# Patient Record
Sex: Female | Born: 1980 | Race: Black or African American | Hispanic: No | Marital: Single | Smoking: Former smoker
Health system: Southern US, Community
[De-identification: ages and names within clinical notes are randomized; demographics above are authoritative.]

## PROBLEM LIST (undated history)

## (undated) DIAGNOSIS — O99345 Other mental disorders complicating the puerperium: Secondary | ICD-10-CM

## (undated) DIAGNOSIS — B379 Candidiasis, unspecified: Secondary | ICD-10-CM

## (undated) DIAGNOSIS — R21 Rash and other nonspecific skin eruption: Secondary | ICD-10-CM

## (undated) DIAGNOSIS — Z30011 Encounter for initial prescription of contraceptive pills: Secondary | ICD-10-CM

## (undated) DIAGNOSIS — F331 Major depressive disorder, recurrent, moderate: Secondary | ICD-10-CM

## (undated) DIAGNOSIS — N760 Acute vaginitis: Secondary | ICD-10-CM

## (undated) DIAGNOSIS — R590 Localized enlarged lymph nodes: Secondary | ICD-10-CM

## (undated) DIAGNOSIS — Z720 Tobacco use: Secondary | ICD-10-CM

## (undated) DIAGNOSIS — Z332 Encounter for elective termination of pregnancy: Secondary | ICD-10-CM

## (undated) DIAGNOSIS — F419 Anxiety disorder, unspecified: Secondary | ICD-10-CM

## (undated) DIAGNOSIS — R002 Palpitations: Secondary | ICD-10-CM

## (undated) DIAGNOSIS — Z9289 Personal history of other medical treatment: Secondary | ICD-10-CM

## (undated) DIAGNOSIS — K649 Unspecified hemorrhoids: Secondary | ICD-10-CM

## (undated) DIAGNOSIS — N898 Other specified noninflammatory disorders of vagina: Secondary | ICD-10-CM

## (undated) DIAGNOSIS — R300 Dysuria: Secondary | ICD-10-CM

## (undated) DIAGNOSIS — A749 Chlamydial infection, unspecified: Secondary | ICD-10-CM

## (undated) DIAGNOSIS — N63 Unspecified lump in unspecified breast: Secondary | ICD-10-CM

## (undated) DIAGNOSIS — F909 Attention-deficit hyperactivity disorder, unspecified type: Secondary | ICD-10-CM

## (undated) DIAGNOSIS — J029 Acute pharyngitis, unspecified: Secondary | ICD-10-CM

## (undated) DIAGNOSIS — G4700 Insomnia, unspecified: Secondary | ICD-10-CM

## (undated) DIAGNOSIS — F53 Postpartum depression: Secondary | ICD-10-CM

## (undated) DIAGNOSIS — O009 Unspecified ectopic pregnancy without intrauterine pregnancy: Secondary | ICD-10-CM

## (undated) HISTORY — DX: Chlamydial infection, unspecified: A74.9

## (undated) HISTORY — DX: Rash and other nonspecific skin eruption: R21

## (undated) HISTORY — DX: Postpartum depression: F53.0

## (undated) HISTORY — DX: Major depressive disorder, recurrent, moderate: F33.1

## (undated) HISTORY — DX: Candidiasis, unspecified: B37.9

## (undated) HISTORY — DX: Palpitations: R00.2

## (undated) HISTORY — DX: Other specified noninflammatory disorders of vagina: N89.8

## (undated) HISTORY — DX: Encounter for elective termination of pregnancy: Z33.2

## (undated) HISTORY — DX: Encounter for initial prescription of contraceptive pills: Z30.011

## (undated) HISTORY — DX: Acute pharyngitis, unspecified: J02.9

## (undated) HISTORY — DX: Acute vaginitis: N76.0

## (undated) HISTORY — DX: Unspecified ectopic pregnancy without intrauterine pregnancy: O00.90

## (undated) HISTORY — DX: Unspecified hemorrhoids: K64.9

## (undated) HISTORY — DX: Insomnia, unspecified: G47.00

## (undated) HISTORY — DX: Localized enlarged lymph nodes: R59.0

## (undated) HISTORY — DX: Unspecified lump in unspecified breast: N63.0

## (undated) HISTORY — DX: Other mental disorders complicating the puerperium: O99.345

## (undated) HISTORY — DX: Dysuria: R30.0

## (undated) HISTORY — DX: Personal history of other medical treatment: Z92.89

## (undated) HISTORY — DX: Tobacco use: Z72.0

## (undated) HISTORY — PX: COLPOSCOPY: SHX161

## (undated) HISTORY — DX: Attention-deficit hyperactivity disorder, unspecified type: F90.9

---
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
## 2001-01-18 ENCOUNTER — Encounter: Admission: RE | Admit: 2001-01-18 | Discharge: 2001-01-18 | Payer: Self-pay | Admitting: Family Medicine

## 2001-03-15 ENCOUNTER — Encounter: Admission: RE | Admit: 2001-03-15 | Discharge: 2001-03-15 | Payer: Self-pay | Admitting: Family Medicine

## 2001-06-24 ENCOUNTER — Encounter: Admission: RE | Admit: 2001-06-24 | Discharge: 2001-06-24 | Payer: Self-pay | Admitting: Family Medicine

## 2001-06-27 ENCOUNTER — Encounter: Admission: RE | Admit: 2001-06-27 | Discharge: 2001-06-27 | Payer: Self-pay | Admitting: Family Medicine

## 2001-07-12 ENCOUNTER — Encounter: Admission: RE | Admit: 2001-07-12 | Discharge: 2001-07-12 | Payer: Self-pay | Admitting: Family Medicine

## 2001-07-12 ENCOUNTER — Other Ambulatory Visit: Admission: RE | Admit: 2001-07-12 | Discharge: 2001-07-12 | Payer: Self-pay | Admitting: Family Medicine

## 2001-08-02 ENCOUNTER — Encounter: Admission: RE | Admit: 2001-08-02 | Discharge: 2001-08-02 | Payer: Self-pay | Admitting: Family Medicine

## 2001-08-19 ENCOUNTER — Encounter: Admission: RE | Admit: 2001-08-19 | Discharge: 2001-08-19 | Payer: Self-pay | Admitting: Family Medicine

## 2001-08-26 ENCOUNTER — Encounter: Admission: RE | Admit: 2001-08-26 | Discharge: 2001-08-26 | Payer: Self-pay | Admitting: Family Medicine

## 2001-10-07 ENCOUNTER — Encounter: Admission: RE | Admit: 2001-10-07 | Discharge: 2001-10-07 | Payer: Self-pay | Admitting: Family Medicine

## 2001-10-25 ENCOUNTER — Encounter: Admission: RE | Admit: 2001-10-25 | Discharge: 2001-10-25 | Payer: Self-pay | Admitting: Family Medicine

## 2001-11-28 ENCOUNTER — Encounter: Admission: RE | Admit: 2001-11-28 | Discharge: 2001-11-28 | Payer: Self-pay | Admitting: Family Medicine

## 2002-01-04 ENCOUNTER — Encounter: Admission: RE | Admit: 2002-01-04 | Discharge: 2002-01-04 | Payer: Self-pay | Admitting: Family Medicine

## 2002-01-11 ENCOUNTER — Encounter: Admission: RE | Admit: 2002-01-11 | Discharge: 2002-01-11 | Payer: Self-pay | Admitting: Sports Medicine

## 2002-01-17 ENCOUNTER — Encounter: Admission: RE | Admit: 2002-01-17 | Discharge: 2002-01-17 | Payer: Self-pay | Admitting: Family Medicine

## 2002-01-25 ENCOUNTER — Encounter: Admission: RE | Admit: 2002-01-25 | Discharge: 2002-01-25 | Payer: Self-pay | Admitting: Family Medicine

## 2002-02-17 ENCOUNTER — Encounter: Admission: RE | Admit: 2002-02-17 | Discharge: 2002-02-17 | Payer: Self-pay | Admitting: Family Medicine

## 2002-03-06 ENCOUNTER — Encounter: Admission: RE | Admit: 2002-03-06 | Discharge: 2002-03-06 | Payer: Self-pay | Admitting: Family Medicine

## 2002-04-12 ENCOUNTER — Encounter: Admission: RE | Admit: 2002-04-12 | Discharge: 2002-04-12 | Payer: Self-pay | Admitting: Family Medicine

## 2002-07-06 ENCOUNTER — Encounter: Admission: RE | Admit: 2002-07-06 | Discharge: 2002-07-06 | Payer: Self-pay | Admitting: Family Medicine

## 2002-07-13 ENCOUNTER — Encounter: Admission: RE | Admit: 2002-07-13 | Discharge: 2002-07-13 | Payer: Self-pay | Admitting: Family Medicine

## 2002-08-11 ENCOUNTER — Encounter: Admission: RE | Admit: 2002-08-11 | Discharge: 2002-08-11 | Payer: Self-pay | Admitting: Family Medicine

## 2002-08-21 ENCOUNTER — Encounter: Admission: RE | Admit: 2002-08-21 | Discharge: 2002-08-21 | Payer: Self-pay | Admitting: Family Medicine

## 2002-08-31 ENCOUNTER — Encounter: Admission: RE | Admit: 2002-08-31 | Discharge: 2002-08-31 | Payer: Self-pay | Admitting: Family Medicine

## 2002-09-05 ENCOUNTER — Encounter: Admission: RE | Admit: 2002-09-05 | Discharge: 2002-09-05 | Payer: Self-pay | Admitting: Family Medicine

## 2002-09-19 ENCOUNTER — Encounter: Admission: RE | Admit: 2002-09-19 | Discharge: 2002-09-19 | Payer: Self-pay | Admitting: Family Medicine

## 2003-04-03 ENCOUNTER — Encounter: Admission: RE | Admit: 2003-04-03 | Discharge: 2003-04-03 | Payer: Self-pay | Admitting: Family Medicine

## 2003-04-03 ENCOUNTER — Encounter (INDEPENDENT_AMBULATORY_CARE_PROVIDER_SITE_OTHER): Payer: Self-pay | Admitting: *Deleted

## 2003-06-12 ENCOUNTER — Encounter: Admission: RE | Admit: 2003-06-12 | Discharge: 2003-06-12 | Payer: Self-pay | Admitting: Family Medicine

## 2003-06-19 ENCOUNTER — Encounter: Admission: RE | Admit: 2003-06-19 | Discharge: 2003-06-19 | Payer: Self-pay | Admitting: Sports Medicine

## 2003-06-20 ENCOUNTER — Ambulatory Visit (HOSPITAL_COMMUNITY): Admission: RE | Admit: 2003-06-20 | Discharge: 2003-06-20 | Payer: Self-pay | Admitting: Family Medicine

## 2003-06-21 ENCOUNTER — Encounter: Admission: RE | Admit: 2003-06-21 | Discharge: 2003-06-21 | Payer: Self-pay | Admitting: Family Medicine

## 2003-07-02 ENCOUNTER — Encounter: Admission: RE | Admit: 2003-07-02 | Discharge: 2003-07-02 | Payer: Self-pay | Admitting: Family Medicine

## 2003-07-20 ENCOUNTER — Encounter: Admission: RE | Admit: 2003-07-20 | Discharge: 2003-07-20 | Payer: Self-pay | Admitting: Sports Medicine

## 2003-07-20 ENCOUNTER — Encounter (INDEPENDENT_AMBULATORY_CARE_PROVIDER_SITE_OTHER): Payer: Self-pay | Admitting: *Deleted

## 2003-07-20 ENCOUNTER — Other Ambulatory Visit: Admission: RE | Admit: 2003-07-20 | Discharge: 2003-07-20 | Payer: Self-pay | Admitting: Family Medicine

## 2003-07-23 ENCOUNTER — Inpatient Hospital Stay (HOSPITAL_COMMUNITY): Admission: AD | Admit: 2003-07-23 | Discharge: 2003-07-24 | Payer: Self-pay | Admitting: Family Medicine

## 2003-08-23 ENCOUNTER — Encounter: Admission: RE | Admit: 2003-08-23 | Discharge: 2003-08-23 | Payer: Self-pay | Admitting: Sports Medicine

## 2003-08-30 ENCOUNTER — Encounter: Admission: RE | Admit: 2003-08-30 | Discharge: 2003-08-30 | Payer: Self-pay | Admitting: Sports Medicine

## 2003-09-25 ENCOUNTER — Ambulatory Visit: Payer: Self-pay | Admitting: Family Medicine

## 2003-09-26 ENCOUNTER — Ambulatory Visit (HOSPITAL_COMMUNITY): Admission: RE | Admit: 2003-09-26 | Discharge: 2003-09-26 | Payer: Self-pay | Admitting: *Deleted

## 2003-10-25 ENCOUNTER — Ambulatory Visit: Payer: Self-pay | Admitting: Family Medicine

## 2003-10-29 ENCOUNTER — Ambulatory Visit: Payer: Self-pay | Admitting: Family Medicine

## 2003-10-29 ENCOUNTER — Ambulatory Visit (HOSPITAL_COMMUNITY): Admission: RE | Admit: 2003-10-29 | Discharge: 2003-10-29 | Payer: Self-pay | Admitting: Sports Medicine

## 2003-11-20 ENCOUNTER — Inpatient Hospital Stay (HOSPITAL_COMMUNITY): Admission: AD | Admit: 2003-11-20 | Discharge: 2003-11-20 | Payer: Self-pay | Admitting: Family Medicine

## 2003-12-04 ENCOUNTER — Ambulatory Visit: Payer: Self-pay | Admitting: Family Medicine

## 2003-12-21 ENCOUNTER — Ambulatory Visit: Payer: Self-pay | Admitting: Family Medicine

## 2004-01-04 ENCOUNTER — Ambulatory Visit: Payer: Self-pay | Admitting: Sports Medicine

## 2004-01-18 ENCOUNTER — Ambulatory Visit: Payer: Self-pay | Admitting: Family Medicine

## 2004-01-22 ENCOUNTER — Ambulatory Visit: Payer: Self-pay | Admitting: Family Medicine

## 2004-01-29 ENCOUNTER — Ambulatory Visit: Payer: Self-pay | Admitting: Family Medicine

## 2004-02-05 ENCOUNTER — Ambulatory Visit: Payer: Self-pay | Admitting: Family Medicine

## 2004-02-12 ENCOUNTER — Ambulatory Visit: Payer: Self-pay | Admitting: Family Medicine

## 2004-02-18 ENCOUNTER — Ambulatory Visit: Payer: Self-pay | Admitting: Sports Medicine

## 2004-02-19 ENCOUNTER — Inpatient Hospital Stay (HOSPITAL_COMMUNITY): Admission: AD | Admit: 2004-02-19 | Discharge: 2004-02-23 | Payer: Self-pay | Admitting: Family Medicine

## 2004-02-19 ENCOUNTER — Encounter (INDEPENDENT_AMBULATORY_CARE_PROVIDER_SITE_OTHER): Payer: Self-pay | Admitting: *Deleted

## 2004-02-19 ENCOUNTER — Ambulatory Visit: Payer: Self-pay | Admitting: *Deleted

## 2004-02-26 ENCOUNTER — Inpatient Hospital Stay (HOSPITAL_COMMUNITY): Admission: AD | Admit: 2004-02-26 | Discharge: 2004-02-26 | Payer: Self-pay | Admitting: *Deleted

## 2004-05-06 ENCOUNTER — Other Ambulatory Visit: Admission: RE | Admit: 2004-05-06 | Discharge: 2004-05-06 | Payer: Self-pay | Admitting: Family Medicine

## 2004-05-06 ENCOUNTER — Ambulatory Visit: Payer: Self-pay | Admitting: Sports Medicine

## 2004-11-28 ENCOUNTER — Ambulatory Visit: Payer: Self-pay | Admitting: Family Medicine

## 2004-12-16 ENCOUNTER — Ambulatory Visit: Payer: Self-pay | Admitting: Family Medicine

## 2005-03-10 ENCOUNTER — Ambulatory Visit: Payer: Self-pay | Admitting: Sports Medicine

## 2005-05-18 ENCOUNTER — Ambulatory Visit: Payer: Self-pay | Admitting: Family Medicine

## 2005-06-18 ENCOUNTER — Encounter (INDEPENDENT_AMBULATORY_CARE_PROVIDER_SITE_OTHER): Payer: Self-pay | Admitting: *Deleted

## 2005-07-07 ENCOUNTER — Ambulatory Visit: Payer: Self-pay | Admitting: Family Medicine

## 2005-07-07 ENCOUNTER — Other Ambulatory Visit: Admission: RE | Admit: 2005-07-07 | Discharge: 2005-07-07 | Payer: Self-pay | Admitting: Family Medicine

## 2005-08-04 ENCOUNTER — Ambulatory Visit: Payer: Self-pay | Admitting: Sports Medicine

## 2005-10-06 ENCOUNTER — Ambulatory Visit: Payer: Self-pay | Admitting: Family Medicine

## 2006-01-26 ENCOUNTER — Ambulatory Visit: Payer: Self-pay | Admitting: Family Medicine

## 2006-03-02 ENCOUNTER — Ambulatory Visit: Payer: Self-pay | Admitting: Family Medicine

## 2006-03-12 ENCOUNTER — Encounter (INDEPENDENT_AMBULATORY_CARE_PROVIDER_SITE_OTHER): Payer: Self-pay | Admitting: *Deleted

## 2006-04-09 ENCOUNTER — Ambulatory Visit: Payer: Self-pay | Admitting: Family Medicine

## 2006-04-09 DIAGNOSIS — F331 Major depressive disorder, recurrent, moderate: Secondary | ICD-10-CM | POA: Insufficient documentation

## 2006-04-09 HISTORY — DX: Major depressive disorder, recurrent, moderate: F33.1

## 2006-04-12 ENCOUNTER — Ambulatory Visit: Payer: Self-pay | Admitting: Family Medicine

## 2006-07-23 ENCOUNTER — Telehealth: Payer: Self-pay | Admitting: *Deleted

## 2006-08-05 ENCOUNTER — Other Ambulatory Visit: Admission: RE | Admit: 2006-08-05 | Discharge: 2006-08-05 | Payer: Self-pay | Admitting: Family Medicine

## 2006-08-05 ENCOUNTER — Ambulatory Visit: Payer: Self-pay | Admitting: Family Medicine

## 2006-08-05 ENCOUNTER — Encounter: Payer: Self-pay | Admitting: Family Medicine

## 2006-08-05 LAB — CONVERTED CEMR LAB: KOH Prep: NEGATIVE

## 2007-02-17 ENCOUNTER — Encounter (INDEPENDENT_AMBULATORY_CARE_PROVIDER_SITE_OTHER): Payer: Self-pay

## 2007-02-17 ENCOUNTER — Ambulatory Visit: Payer: Self-pay

## 2007-03-08 ENCOUNTER — Encounter (INDEPENDENT_AMBULATORY_CARE_PROVIDER_SITE_OTHER): Payer: Self-pay | Admitting: Family Medicine

## 2007-03-08 ENCOUNTER — Ambulatory Visit: Payer: Self-pay | Admitting: Family Medicine

## 2007-03-08 LAB — CONVERTED CEMR LAB
Chlamydia, DNA Probe: NEGATIVE
Whiff Test: NEGATIVE

## 2007-03-10 ENCOUNTER — Encounter (INDEPENDENT_AMBULATORY_CARE_PROVIDER_SITE_OTHER): Payer: Self-pay | Admitting: Family Medicine

## 2007-05-05 ENCOUNTER — Ambulatory Visit: Payer: Self-pay | Admitting: Family Medicine

## 2007-06-01 ENCOUNTER — Encounter: Payer: Self-pay | Admitting: Family Medicine

## 2007-06-23 ENCOUNTER — Encounter: Payer: Self-pay | Admitting: Family Medicine

## 2007-06-23 ENCOUNTER — Ambulatory Visit: Payer: Self-pay | Admitting: Family Medicine

## 2007-06-23 LAB — CONVERTED CEMR LAB
Chlamydia, DNA Probe: NEGATIVE
KOH Prep: NEGATIVE

## 2007-07-08 ENCOUNTER — Telehealth (INDEPENDENT_AMBULATORY_CARE_PROVIDER_SITE_OTHER): Payer: Self-pay | Admitting: *Deleted

## 2007-08-22 ENCOUNTER — Telehealth (INDEPENDENT_AMBULATORY_CARE_PROVIDER_SITE_OTHER): Payer: Self-pay | Admitting: *Deleted

## 2007-08-29 ENCOUNTER — Telehealth: Payer: Self-pay | Admitting: *Deleted

## 2007-09-02 ENCOUNTER — Encounter: Payer: Self-pay | Admitting: Family Medicine

## 2007-09-02 ENCOUNTER — Ambulatory Visit: Payer: Self-pay | Admitting: Family Medicine

## 2007-09-02 LAB — CONVERTED CEMR LAB
Alkaline Phosphatase: 37 units/L — ABNORMAL LOW (ref 39–117)
BUN: 15 mg/dL (ref 6–23)
Chloride: 105 meq/L (ref 96–112)
Glucose, Bld: 86 mg/dL (ref 70–99)
HCT: 38.3 % (ref 36.0–46.0)
Platelets: 254 10*3/uL (ref 150–400)
Potassium: 4.6 meq/L (ref 3.5–5.3)
RBC: 4.28 M/uL (ref 3.87–5.11)
RDW: 13.7 % (ref 11.5–15.5)
Total Bilirubin: 0.3 mg/dL (ref 0.3–1.2)
WBC: 5.3 10*3/uL (ref 4.0–10.5)

## 2007-09-05 ENCOUNTER — Encounter: Payer: Self-pay | Admitting: Family Medicine

## 2007-11-15 ENCOUNTER — Ambulatory Visit: Payer: Self-pay | Admitting: Family Medicine

## 2007-11-15 DIAGNOSIS — K649 Unspecified hemorrhoids: Secondary | ICD-10-CM

## 2007-11-15 HISTORY — DX: Unspecified hemorrhoids: K64.9

## 2007-11-22 ENCOUNTER — Telehealth: Payer: Self-pay | Admitting: Family Medicine

## 2007-12-05 ENCOUNTER — Telehealth: Payer: Self-pay | Admitting: *Deleted

## 2007-12-05 ENCOUNTER — Ambulatory Visit: Payer: Self-pay | Admitting: Family Medicine

## 2007-12-05 ENCOUNTER — Encounter: Payer: Self-pay | Admitting: Family Medicine

## 2007-12-05 LAB — CONVERTED CEMR LAB
Beta hcg, urine, semiquantitative: NEGATIVE
Chlamydia, DNA Probe: NEGATIVE
Glucose, Urine, Semiquant: NEGATIVE
Nitrite: POSITIVE
Specific Gravity, Urine: >=1.03
Whiff Test: NEGATIVE
pH: 6.5

## 2007-12-12 ENCOUNTER — Encounter: Payer: Self-pay | Admitting: Family Medicine

## 2008-03-14 ENCOUNTER — Ambulatory Visit: Payer: Self-pay | Admitting: Family Medicine

## 2008-03-14 ENCOUNTER — Encounter (INDEPENDENT_AMBULATORY_CARE_PROVIDER_SITE_OTHER): Payer: Self-pay | Admitting: Family Medicine

## 2008-03-14 LAB — CONVERTED CEMR LAB
Bilirubin Urine: NEGATIVE
Glucose, Urine, Semiquant: NEGATIVE
Nitrite: NEGATIVE
Protein, U semiquant: NEGATIVE
Specific Gravity, Urine: 1.02
Whiff Test: NEGATIVE

## 2008-03-30 ENCOUNTER — Telehealth: Payer: Self-pay | Admitting: Family Medicine

## 2008-05-17 ENCOUNTER — Telehealth: Payer: Self-pay | Admitting: *Deleted

## 2008-05-31 ENCOUNTER — Telehealth: Payer: Self-pay | Admitting: *Deleted

## 2008-06-01 ENCOUNTER — Ambulatory Visit: Payer: Self-pay | Admitting: Family Medicine

## 2008-06-01 LAB — CONVERTED CEMR LAB
Beta hcg, urine, semiquantitative: NEGATIVE
Ketones, urine, test strip: NEGATIVE
Urobilinogen, UA: 1
WBC Urine, dipstick: NEGATIVE
Whiff Test: POSITIVE
pH: 6

## 2008-07-24 ENCOUNTER — Telehealth: Payer: Self-pay | Admitting: Family Medicine

## 2008-11-27 ENCOUNTER — Ambulatory Visit: Payer: Self-pay | Admitting: Family Medicine

## 2008-11-27 ENCOUNTER — Telehealth: Payer: Self-pay | Admitting: Family Medicine

## 2008-11-27 ENCOUNTER — Encounter: Payer: Self-pay | Admitting: Family Medicine

## 2008-11-27 ENCOUNTER — Other Ambulatory Visit: Admission: RE | Admit: 2008-11-27 | Discharge: 2008-11-27 | Payer: Self-pay | Admitting: Family Medicine

## 2008-11-27 LAB — CONVERTED CEMR LAB
Beta hcg, urine, semiquantitative: NEGATIVE
HCT: 40.7 % (ref 36.0–46.0)
Hemoglobin: 12.8 g/dL (ref 12.0–15.0)
MCHC: 31.4 g/dL (ref 30.0–36.0)
RBC: 4.43 M/uL (ref 3.87–5.11)
RDW: 14.3 % (ref 11.5–15.5)

## 2008-11-28 ENCOUNTER — Encounter: Payer: Self-pay | Admitting: Family Medicine

## 2008-11-29 ENCOUNTER — Encounter: Payer: Self-pay | Admitting: Family Medicine

## 2008-12-03 ENCOUNTER — Telehealth: Payer: Self-pay | Admitting: *Deleted

## 2008-12-04 ENCOUNTER — Telehealth: Payer: Self-pay | Admitting: Family Medicine

## 2008-12-10 ENCOUNTER — Encounter: Payer: Self-pay | Admitting: Family Medicine

## 2008-12-21 ENCOUNTER — Ambulatory Visit: Payer: Self-pay | Admitting: Family Medicine

## 2008-12-21 DIAGNOSIS — F17200 Nicotine dependence, unspecified, uncomplicated: Secondary | ICD-10-CM

## 2009-01-02 ENCOUNTER — Telehealth: Payer: Self-pay | Admitting: Family Medicine

## 2009-01-02 ENCOUNTER — Encounter: Payer: Self-pay | Admitting: Family Medicine

## 2009-01-10 ENCOUNTER — Telehealth: Payer: Self-pay | Admitting: *Deleted

## 2009-02-21 ENCOUNTER — Ambulatory Visit: Payer: Self-pay | Admitting: Family Medicine

## 2009-02-21 ENCOUNTER — Encounter: Payer: Self-pay | Admitting: Family Medicine

## 2009-02-21 ENCOUNTER — Encounter (INDEPENDENT_AMBULATORY_CARE_PROVIDER_SITE_OTHER): Payer: Self-pay | Admitting: *Deleted

## 2009-02-21 DIAGNOSIS — R87610 Atypical squamous cells of undetermined significance on cytologic smear of cervix (ASC-US): Secondary | ICD-10-CM

## 2009-03-13 ENCOUNTER — Telehealth: Payer: Self-pay | Admitting: Family Medicine

## 2009-03-13 ENCOUNTER — Ambulatory Visit: Payer: Self-pay | Admitting: Family Medicine

## 2009-03-13 LAB — CONVERTED CEMR LAB
Blood in Urine, dipstick: NEGATIVE
Specific Gravity, Urine: 1.02

## 2009-03-14 ENCOUNTER — Encounter: Payer: Self-pay | Admitting: Family Medicine

## 2009-03-28 ENCOUNTER — Telehealth: Payer: Self-pay | Admitting: Family Medicine

## 2009-04-11 ENCOUNTER — Ambulatory Visit: Payer: Self-pay | Admitting: Family Medicine

## 2009-04-11 ENCOUNTER — Encounter: Payer: Self-pay | Admitting: Family Medicine

## 2009-04-11 LAB — CONVERTED CEMR LAB: Whiff Test: NEGATIVE

## 2009-04-12 ENCOUNTER — Telehealth: Payer: Self-pay | Admitting: Family Medicine

## 2009-04-12 LAB — CONVERTED CEMR LAB
Chlamydia, DNA Probe: NEGATIVE
GC Probe Amp, Genital: NEGATIVE

## 2009-06-04 ENCOUNTER — Telehealth: Payer: Self-pay | Admitting: Family Medicine

## 2009-06-04 ENCOUNTER — Ambulatory Visit: Payer: Self-pay | Admitting: Family Medicine

## 2009-07-10 ENCOUNTER — Telehealth: Payer: Self-pay | Admitting: *Deleted

## 2009-07-25 ENCOUNTER — Telehealth: Payer: Self-pay | Admitting: Family Medicine

## 2009-08-19 ENCOUNTER — Telehealth: Payer: Self-pay | Admitting: Family Medicine

## 2009-08-20 ENCOUNTER — Telehealth: Payer: Self-pay | Admitting: Family Medicine

## 2009-08-28 ENCOUNTER — Encounter: Payer: Self-pay | Admitting: Family Medicine

## 2009-08-28 ENCOUNTER — Ambulatory Visit: Payer: Self-pay | Admitting: Family Medicine

## 2009-08-28 ENCOUNTER — Telehealth: Payer: Self-pay | Admitting: Family Medicine

## 2009-08-28 LAB — CONVERTED CEMR LAB
Bilirubin Urine: NEGATIVE
Glucose, Urine, Semiquant: NEGATIVE
Ketones, urine, test strip: NEGATIVE
Nitrite: NEGATIVE
Specific Gravity, Urine: >=1.03
Urobilinogen, UA: 0.2
pH: 6

## 2009-09-03 ENCOUNTER — Telehealth: Payer: Self-pay | Admitting: *Deleted

## 2009-09-04 ENCOUNTER — Telehealth: Payer: Self-pay | Admitting: Family Medicine

## 2009-09-06 ENCOUNTER — Ambulatory Visit: Payer: Self-pay | Admitting: Family Medicine

## 2009-09-06 LAB — CONVERTED CEMR LAB
Bilirubin Urine: NEGATIVE
Ketones, urine, test strip: NEGATIVE
Nitrite: NEGATIVE
Protein, U semiquant: NEGATIVE
Specific Gravity, Urine: 1.025
WBC Urine, dipstick: NEGATIVE
pH: 6.5

## 2009-10-14 ENCOUNTER — Ambulatory Visit: Payer: Self-pay | Admitting: Family Medicine

## 2009-10-14 ENCOUNTER — Encounter: Payer: Self-pay | Admitting: Family Medicine

## 2009-10-14 LAB — CONVERTED CEMR LAB
Blood in Urine, dipstick: NEGATIVE
Protein, U semiquant: NEGATIVE
WBC Urine, dipstick: NEGATIVE
Whiff Test: POSITIVE

## 2009-10-15 LAB — CONVERTED CEMR LAB: Chlamydia, DNA Probe: NEGATIVE

## 2009-11-08 ENCOUNTER — Ambulatory Visit: Payer: Self-pay | Admitting: Family Medicine

## 2009-12-20 ENCOUNTER — Ambulatory Visit: Payer: Self-pay | Admitting: Family Medicine

## 2009-12-20 ENCOUNTER — Encounter: Payer: Self-pay | Admitting: *Deleted

## 2009-12-20 DIAGNOSIS — N760 Acute vaginitis: Secondary | ICD-10-CM | POA: Insufficient documentation

## 2009-12-20 DIAGNOSIS — R300 Dysuria: Secondary | ICD-10-CM

## 2009-12-20 HISTORY — DX: Dysuria: R30.0

## 2009-12-20 HISTORY — DX: Acute vaginitis: N76.0

## 2009-12-20 LAB — CONVERTED CEMR LAB
Epithelial cells, urine: >20 /lpf
Glucose, Urine, Semiquant: NEGATIVE
Nitrite: NEGATIVE
Specific Gravity, Urine: >=1.03
Urobilinogen, UA: 0.2

## 2010-02-10 ENCOUNTER — Ambulatory Visit
Admission: RE | Admit: 2010-02-10 | Discharge: 2010-02-10 | Payer: Self-pay | Source: Home / Self Care | Attending: Family Medicine | Admitting: Family Medicine

## 2010-02-10 DIAGNOSIS — J029 Acute pharyngitis, unspecified: Secondary | ICD-10-CM

## 2010-02-10 HISTORY — DX: Acute pharyngitis, unspecified: J02.9

## 2010-02-10 LAB — CONVERTED CEMR LAB: Rapid Strep: NEGATIVE

## 2010-02-11 ENCOUNTER — Telehealth (INDEPENDENT_AMBULATORY_CARE_PROVIDER_SITE_OTHER): Payer: Self-pay | Admitting: *Deleted

## 2010-02-11 NOTE — Progress Notes (Signed)
Summary: triage  Phone Note Call from Patient Call back at Home Phone [phone]   Caller: Patient Summary of Call: has bv and would to know if she has to come in Initial call taken by: De Nurse,  July 25, 2009 8:30 AM  Follow-up for Phone Call        appt for work in now per pt request Follow-up by: Golden Circle RN,  July 25, 2009 9:02 AM    New/Updated Medications: METRONIDAZOLE 500 MG TABS (METRONIDAZOLE) one two times a day for 7 days Prescriptions: METRONIDAZOLE 500 MG TABS (METRONIDAZOLE) one two times a day for 7 days  #14 x 0   Entered and Authorized by:   [name] NP   Signed by:   [name] NP on 07/25/2009   Method used:   Electronically to        [facility] # [phone]* (retail)       [address]       [place], [place]  [address]       Ph: [phone]       Fax: [phone]   RxID:   [id number]

## 2010-02-11 NOTE — Assessment & Plan Note (Signed)
Summary: change in antidepressant,tcb   Vital Signs:  Patient profile:   30 year old female Height:      62 inches Weight:      130 pounds BMI:     23.86 Temp:     98 degrees F oral Pulse rate:   67 / minute Pulse rhythm:   regular BP sitting:   130 / 82  (left arm) Cuff size:   regular  Vitals Entered By: Loralee Pacas CMA (November 08, 2009 2:51 PM) CC: med change   Primary Care Provider:  [name] NP  CC:  med change.  History of Present Illness: Restarted her citalopram as she has been very short with her children.  She finds herself irritible in and around her period.  Tolerated citalopram in the past but now having nausea, has tried with food, has tried, 1/2 and still bothers her.  She has lost weight.  She would like to try a different antidepressant.  Occasionally gets very angry and would like something to take for those times, but is keenly aware of addiction and has friends who are dependent on Xanax and she does not want to become like them.  Going to school for radiology tech, two children doing well.  Boyfriend stays with her but has is own apt as well.  Father helps her pay her bills.  Habits & Providers  Exercise-Depression-Behavior     Does Patient Exercise: no     Have you felt down or hopeless? no     Have you felt little pleasure in things? no     Depression Counseling: not indicated; screening negative for depression  Current Medications (verified): 1)  Wellbutrin Sr 150 Mg Xr12h-Tab (Bupropion Hcl) .... One Tab Daily 2)  Lorazepam 0.5 Mg Tabs (Lorazepam) .... 1/2 Tab For Severe Anxiety  Allergies: No Known Drug Allergies  Family History: Grandmother - HTN; Mother-depression Father-anxiety, PTSD  Social History: Lives with daughters Dia Sitter; divorced formusband.  Lives with boyfriend. Smokes 1 PPD (stopped with pregnancy), stopped 5/09, stopped 2010. Graduation from [employer] program 5/09, back in school for radiology techDoes Patient  Exercise:  no  Review of Systems      See HPI Psych:  Complains of depression, easily angered, and irritability; denies panic attacks and suicidal thoughts/plans.  Physical Exam  General:  Well-developed,well-nourished,in no acute distress; alert,appropriate and cooperative throughout examination Psych:  normally interactive, good eye contact, not anxious appearing, and not depressed appearing.     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 One tab daily 2)  Lorazepam 0.5 Mg Tabs (Lorazepam) .... 1/2 tab for severe anxiety  Patient Instructions: 1)  Take in AM, give it at least 2-4 weeks before  you decide it effectiveness 2)  Let me know if does not work Prescriptions: LORAZEPAM 0.5 MG TABS (LORAZEPAM) 1/2 tab for severe anxiety Brand medically necessary #5 x 0   Entered and Authorized by:   [name] NP   Signed by:   [name] NP on 11/08/2009   Method used:   Print then Give to Patient   RxID:   [id number] WELLBUTRIN SR 150 MG XR12H-TAB (BUPROPION HCL) one tab daily  #30 x 6   Entered and Authorized by:   [name] NP  Signed by:   [name] NP on 11/08/2009   Method used:   Electronically to        [facility] # [phone]* (retail)       [address]       [place], [place]  [address]       Ph: [phone]       Fax: [phone]   RxID:   [id number]    Orders Added: 1)  [facility]- Est Level  3 [[address]]

## 2010-02-11 NOTE — Assessment & Plan Note (Signed)
Summary: colpo per saxon/tcb   Vital Signs:  Patient profile:   30 year old female Height:      65 inches Weight:      136 pounds BMI:     22.71 Temp:     98.5 degrees F oral Pulse rate:   73 / minute BP sitting:   126 / 75  (right arm) Cuff size:   regular  Vitals Entered By: Gladstone Pih (February 21, 2009 10:35 AM) CC: Colpo Is Patient Diabetic? No Pain Assessment Patient in pain? no        Primary Care Provider:  [name] NP  CC:  Colpo.  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  Habits & Providers  Alcohol-Tobacco-Diet     Tobacco Status: quit     Year Quit: 01/12/2009  Allergies: No Known Drug Allergies  Social History: Smoking Status:  quit  Physical Exam  Genitalia:  normal introitus, no external lesions, no vaginal discharge, mucosa pink and moist, and no vaginal or cervical lesions.   Additional Exam:  PUS reviewed--no masses, normal stripe. Patient given informed consent, signed copy in the chart. Placed in lithotomy position. Cervix viewed with speculum and colposcope. Was the entire squamocolumnar junction seen?yes Any acetowhite lesions noted?no Any abnormalities seen with green filter?no Any abnormalities seen with application of Lugol's solution?no Was the endocervical canal sampled?no Were any cervical biopsies taken?no Were there any complications?no COMMENTS: Patient was given post procedure instructions.     Impression & Recommendations:  Problem # 1:  PAPANICOLAOU SMEAR, ABNORMAL (ICD-795.0) repeat pap in one year colposcopically clinically normal  Complete Medication List: 1)  Metrogel-vaginal 0.75 % Gel (Metronidazole) .... One applicator at bedtime for 3 for symptoms, qs 2)  Nicoderm Cq 21 Mg/24hr Pt24 (Nicotine) .... One daily for one month 3)  Nicoderm Cq 14 Mg/24hr Pt24 (Nicotine) .... One daily -begin in december 4)  Nicoderm Cq 7 Mg/24hr Pt24 (Nicotine) .... One daily in  january 5)  Nicotine Polacrilex 4 Mg Gum (Nicotine polacrilex) .... Use 1/2 piece of gum each craving. up to 6 times per day  Other Orders: Colposcopy w/out biopsy [facility] ([address])  Appended Document: colpo per saxon/tcb PUS was from a different patient  please ignore those comments

## 2010-02-11 NOTE — Letter (Signed)
Summary: [facility]  [address]   [place], [place] [address]   Phone: [phone]  Fax: [phone]    03/14/2009  [facility] [address] [place], [place]  [address]  Dear Ms. Leedom,  Your urinalysis was normal. Your HIV, gonorrhea and chlamydia tests were all negative.         Sincerely,   Denny Levy MD  Appended Document: LABLetter mailed.

## 2010-02-11 NOTE — Progress Notes (Signed)
Summary: results/see message/ts  Phone Note Call from Patient Call back at Home Phone [phone]   Caller: Patient Summary of Call: wants to know results of UA from last week Initial call taken by: De Nurse,  September 03, 2009 2:52 PM  Follow-up for Phone Call        her urine culture showed multiple morphotypes, indicating that she likely didn't have a true UTI. it is still okay that she took the antibiotics.  Additional Follow-up for Phone Call Additional follow up Details #1::        lmvm to call back. see previous message. Additional Follow-up by: Arlyss Repress CMA,,  September 04, 2009 5:24 PM

## 2010-02-11 NOTE — Assessment & Plan Note (Signed)
Summary: std ck,tcb   Vital Signs:  Patient profile:   30 year old female Height:      65 inches Weight:      135.6 pounds BMI:     22.65 Temp:     98.6 degrees F oral Pulse rate:   87 / minute BP sitting:   106 / 66  (left arm) Cuff size:   regular  Vitals Entered By: Gladstone Pih (March 13, 2009 10:40 AM) CC: C/O burning and freq urination, wants to be checked for STD's Is Patient Diabetic? No Pain Assessment Patient in pain? no        Primary Care Provider:  [name] NP  CC:  C/O burning and freq urination and wants to be checked for STD's.  History of Present Illness: Has had some vaginal d/c--no itching or burning. Newsexual partner. Desires complete STD check including repeat HIV test. Has been using condoms most of the time. has not restarted her OCP as she thought it would be 'useless" since she is onminocycline for her acne. LMP 2 weeks ago  Habits & Providers  Alcohol-Tobacco-Diet     Tobacco Status: never  Allergies: No Known Drug Allergies  Social History: Smoking Status:  never  Review of Systems       denies pelvicpain or vaginal lesions  Physical Exam  General:  alert and well-developed.   Abdomen:  soft and non-tender.   Genitalia:  normal introitus and no external lesions.  thin white vaginal d/c.normal appearing cervix. --no cervical or uterine tenderness, no adnexal masses   Impression & Recommendations:  Problem # 1:  SCREENING EXAMINATION FOR VENEREAL DISEASE (ICD-V74.5) Assessment Unchanged  Orders: [facility]- Est Level  3 ([address])  Problem # 2:  CONTRACEPTIVE MANAGEMENT (ICD-V25.09)  reinforced benefits of OCP--esp as her condom use is intermittent. OCP MAY not be as beneficial for preventing pregnancy while she is on abx but the combo of OCP and condoms is a better option that intermittent consom use.  Orders: [facility]- Est Level  3 ([address])  Complete Medication List: 1)  Metrogel-vaginal 0.75 % Gel (Metronidazole) .... One  applicator at bedtime for 3 for symptoms, qs 2)  Nicoderm Cq 21 Mg/24hr Pt24 (Nicotine) .... One daily for one month 3)  Nicoderm Cq 14 Mg/24hr Pt24 (Nicotine) .... One daily -begin in december 4)  Nicoderm Cq 7 Mg/24hr Pt24 (Nicotine) .... One daily in january 5)  Nicotine Polacrilex 4 Mg Gum (Nicotine polacrilex) .... Use 1/2 piece of gum each craving. up to 6 times per day  Other Orders: [facility] (00000) [phone][facility] GC/Chlamydia-[facility] (87591/87491) HIV-[facility] [phone])  Laboratory Results   Urine Tests  Date/Time Received: March 13, 2009 10:50 AM  Date/Time Reported: March 13, 2009 10:54 AM   Routine Urinalysis   Color: yellow Appearance: Clear Glucose: negative   (Normal Range: Negative) Bilirubin: negative   (Normal Range: Negative) Ketone: negative   (Normal Range: Negative) Spec. Gravity: 1.020   (Normal Range: 1.003-1.035) Blood: negative   (Normal Range: Negative) pH: 7.5   (Normal Range: 5.0-8.0) Protein: trace   (Normal Range: Negative) Urobilinogen: 1.0   (Normal Range: 0-1) Nitrite: negative   (Normal Range: Negative) Leukocyte Esterace: negative   (Normal Range: Negative)    Comments: ...........test performed by...........Marland KitchenTerese Door, CMA   Date/Time Received: March 13, 2009 10:50 AM  Date/Time Reported: March 13, 2009 11:04 AM   [place] Source: vaginal WBC/hpf: 1-5 Bacteria/hpf: 3+  [place] Clue cells/hpf: none  Negative whiff Yeast/hpf: none Trichomonas/hpf: none Comments: ...........test performed by...........Marland KitchenTerese Door, CMA

## 2010-02-11 NOTE — Miscellaneous (Signed)
Summary: Informed Consent for Medical Procedure  Informed Consent for Medical Procedure   Imported By: Knox Royalty 03/07/2009 08:41:49  _____________________________________________________________________  External Attachment:    Type:   Image     Comment:   External Document

## 2010-02-11 NOTE — Progress Notes (Signed)
Summary: triage  Phone Note Call from Patient Call back at Home Phone [phone]   Caller: Patient Summary of Call: was taking metrogel about a week ago and now has burning and discomfort Initial call taken by: De Nurse,  August 28, 2009 10:57 AM  Follow-up for Phone Call        she will see Dr. Earlene Plater for her vaginal irritation later today Follow-up by: Golden Circle RN,  August 28, 2009 11:08 AM

## 2010-02-11 NOTE — Progress Notes (Signed)
Summary: Lab Res  Phone Note Call from Patient Call back at [facility] Phone [phone]   Caller: Patient Summary of Call: Checking on labs from yesterday. Initial call taken by: Clydell Hakim,  April 12, 2009 2:13 PM  Follow-up for Phone Call        will forward to MD. Follow-up by: Theresia Lo RN,  April 12, 2009 2:18 PM  Additional Follow-up for Phone Call Additional follow up Details #1::        discussed labs with pt.  took fluconazole.  no relief yet, but it has been less than 24 hours.  she can try some otc yeast cream this weekend.  if not some better by monday or tuesday, come back for follow up Additional Follow-up by: Asher Muir MD,  April 12, 2009 4:39 PM

## 2010-02-11 NOTE — Progress Notes (Signed)
Summary: wait.for pt to call back/see note/ts  Phone Note Call from Patient Call back at [facility] Phone [phone]   Caller: Patient Summary of Call: Would like a referral to a Dr. Karlyn Agee dermatologist.  She already sees him but needs a referral for mole removal. Initial call taken by: Clydell Hakim,  July 10, 2009 3:31 PM    Referred to Haydee Monica NP  Starleen Blue RN  July 10, 2009 3:49 PM called pt and lmvm to call back.  PLEASE ASK PT, where the mole is and we can sched. ov with dr. Yetta Barre [phone]) for her per s.saxon waiting for pt to call back.Arlyss Repress CMA,  July 12, 2009 2:08 PM

## 2010-02-11 NOTE — Letter (Signed)
Summary: Generic Letter  [facility]  [address]   [place], [place] [address]   Phone: [phone]  Fax: [phone]    03/13/2009  [facility] [address] [place], [place]  [address]  Dear Ms. Mustapha,  Your wet prep was negative for yeast, neagtive for bacterial vagiosis and negative for trichomonas.         Sincerely,   Denny Levy MD

## 2010-02-11 NOTE — Assessment & Plan Note (Signed)
Summary: uti?/eo   Vital Signs:  Patient profile:   30 year old female Height:      62 inches Weight:      134 pounds BMI:     24.60 Temp:     98.2 degrees F oral Pulse rate:   81 / minute BP sitting:   132 / 78  (left arm) Cuff size:   regular  Vitals Entered By: Tessie Fass CMA (December 20, 2009 2:27 PM) CC: UTI   Primary Care Provider:  [name] NP  CC:  UTI.  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  Everythime she has sex it happens.  She prefers the pills.  Treated in October, August, May-every few months.  Also having urinary symptoms.  Wellbutrin is hot helping much, she does feel the effect of the lorazepam when she gets irritible.  Has on occsion taken 2 at at a time.  Allergies: No Known Drug Allergies  Physical Exam  General:  Well-developed,well-nourished,in no acute distress; alert,appropriate and cooperative throughout examination Genitalia:  normal external exam, large amount of cervical discharge.  Wet mount:many clues   Impression & Recommendations:  Problem # 1:  UNSPECIFIED VAGINITIS AND VULVOVAGINITIS (ICD-616.10) recurrent BV prefers oral systemic treatment over intravaginal treatment. Her updated medication list for this problem includes:    Metronidazole 500 Mg Tabs (Metronidazole) ..... 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 1/2 tab for severe anxiety 2)  Wellbutrin Xl 300 Mg Xr24h-tab (Bupropion hcl) .... One daiy(dosage change) 3)  Metronidazole 500 Mg Tabs (Metronidazole) .... One two times a day for 7 days 4)  Fluconazole 150 Mg Tabs (Fluconazole) .... One, once  Other Orders: Urinalysis-[facility] (00000)  Patient Instructions: 1)  please drink a lot of  water Prescriptions: FLUCONAZOLE 150 MG TABS (FLUCONAZOLE) one, once  #1 x 1   Entered and Authorized by:   [name] NP   Signed by:   [name] NP on 12/20/2009   Method used:   Electronically to        [facility] [address] # [phone]* (retail)       [address]       [place], [place]  [address]       Ph: [phone]       Fax: [phone]   RxID:   [id number] METRONIDAZOLE 500 MG TABS (METRONIDAZOLE) one two times a day for 7 days  #14 x 1   Entered and Authorized by:   [name] NP   Signed by:   [name] NP on 12/20/2009   Method used:   Electronically to        [facility] [address] # [phone]* (retail)       [address]       [place], [place]  [address]       Ph: [phone]       Fax: [phone]   RxID:   [id number] WELLBUTRIN XL 300 MG XR24H-TAB (BUPROPION HCL) one daiy(dosage change)  #30 x 6   Entered and Authorized by:   [name] NP   Signed by:   [name] NP on 12/20/2009   Method used:   Electronically to        [facility] #  [id number]* (retail)       [address]       [place], [place]  [address]       Ph: [phone]       Fax: [phone]   RxID:   [id number]    Orders Added: 1)  Urinalysis-[facility] [00000] 2)  Wet Prep- [facility] [87[address]] 3)  [facility]- Est Level  3 [[address]]    Laboratory Results   Urine Tests  Date/Time Received: December 20, 2009 2:35 PM  Date/Time Reported: December 20, 2009 3:18 PM   Routine Urinalysis   Color: yellow Appearance: Clear Glucose: negative   (Normal Range: Negative) Bilirubin: negative   (Normal Range: Negative) Ketone: negative   (Normal Range: Negative) Spec. Gravity: >=1.030   (Normal Range: 1.003-1.035) Blood: trace-intact   (Normal Range: Negative) pH: 6.5   (Normal Range: 5.0-8.0) Protein: negative   (Normal Range: Negative) Urobilinogen: 0.2   (Normal Range: 0-1) Nitrite: negative   (Normal Range: Negative) Leukocyte Esterace: trace   (Normal Range: Negative)  Urine  Microscopic WBC/HPF: 1-5 RBC/HPF: 0-3 Bacteria/HPF: 3+ rods and cocci Epithelial/HPF: >20 Other: many clue cells    Comments: ...............test performed by......Marland KitchenBonnie A. [place], MLS (ASCP)cm  Date/Time Received: December 20, 2009 2:35  PM  Date/Time Reported: December 20, 2009 3:21 PM   [employer] Source: vag WBC/hpf: >20 Bacteria/hpf: 3+  Cocci Clue cells/hpf: many  Positive whiff Yeast/hpf: none Trichomonas/hpf: none Comments: ...............test performed by......Marland KitchenBonnie A. [place], MLS (ASCP)cm

## 2010-02-11 NOTE — Assessment & Plan Note (Signed)
Summary: vag discharge/Clint   Vital Signs:  Patient profile:   30 year old female Height:      65 inches Weight:      134 pounds BMI:     22.38 Temp:     98.8 degrees F oral BP sitting:   126 / 64  (right arm) Cuff size:   regular  Vitals Entered By: Tessie Fass CMA (Jun 04, 2009 10:52 AM) CC: vaginal itching Is Patient Diabetic? No Pain Assessment Patient in pain? no        Primary Care Provider:  [name] NP  CC:  vaginal itching.  History of Present Illness: Vaginal discharge and itching.  Using condoms and OCP since she is on oral retnoid for acne per dermatologist.  Denies abdominal pain.  Habits & Providers  Alcohol-Tobacco-Diet     Tobacco Status: quit  Allergies: No Known Drug Allergies  Social History: Smoking Status:  quit  Physical Exam  General:  Well-developed,well-nourished,in no acute distress; alert,appropriate and cooperative throughout examination Genitalia:  Normal introitus for age, no external lesions, no vaginal discharge, mucosa pink and moist, no vaginal or cervical lesions, no vaginal atrophy, no friaility or hemorrhage, normal uterus size and position, no adnexal masses or tenderness Wet prep + clues and wiff   Impression & Recommendations:  Problem # 1:  VAGINITIS, BACTERIAL (ICD-616.10)  Her updated medication list for this problem includes:    Metrogel-vaginal 0.75 % Gel (Metronidazole) ..... One applicator at bedtime for 3 for symptoms, qs  Orders: [facility]- Est Level  3 ([address])  Complete Medication List: 1)  Metrogel-vaginal 0.75 % Gel (Metronidazole) .... One applicator at bedtime for 3 for symptoms, qs 2)  Nicoderm Cq 21 Mg/24hr Pt24 (Nicotine) .... One daily for one month 3)  Nicoderm Cq 14 Mg/24hr Pt24 (Nicotine) .... One daily -begin in december 4)  Nicoderm Cq 7 Mg/24hr Pt24 (Nicotine) .... One daily in january 5)  Nicotine Polacrilex 4 Mg Gum (Nicotine polacrilex) .... Use 1/2 piece of gum each craving. up to 6 times  per day 6)  Fluconazole 150 Mg Tabs (Fluconazole) .Marland Kitchen.. 1 tab by mouth x 1.  may repeat in 48 hours if symptoms not better  [facility] Prescriptions: METROGEL-VAGINAL 0.75 % GEL (METRONIDAZOLE) one applicator at bedtime for 3 for symptoms, QS  #1 x 1   Entered and Authorized by:   [name] NP   Signed by:   [name] NP on 06/04/2009   Method used:   Electronically to        [facility] [address] # [phone]* (retail)       [address]       [place], [place]  [address]       Ph: [phone]       Fax: [phone]   RxID:   [id number]   Laboratory Results  Date/Time Received: Jun 04, 2009 11:12 AM  Date/Time Reported: Jun 04, 2009 11:29 AM   [place] Source: vag WBC/hpf: 1-5 Bacteria/hpf: 3+  Cocci Clue cells/hpf: many  Positive whiff Yeast/hpf: none Trichomonas/hpf: none Comments: several RBC's ...............test performed by......Marland KitchenBonnie A. [place], MLS (ASCP)cm

## 2010-02-11 NOTE — Progress Notes (Signed)
Summary: phone message from pt/ts  Phone Note Call from Patient Call back at Home Phone [phone]   Caller: Patient Summary of Call: Pt wanted Darl Pikes to know that she has her liver functions tested every month due to using Acutane and did not know if this would make a difference in her using the pills or not. Initial call taken by: Clydell Hakim,  August 20, 2009 10:45 AM  Follow-up for Phone Call        fwd. to s.saxon Follow-up by: Arlyss Repress CMA,,  August 20, 2009 12:24 PM  Additional Follow-up for Phone Call Additional follow up Details #1::        not sure, not relevant.  She needs to understand chronic nature of BV.

## 2010-02-11 NOTE — Assessment & Plan Note (Signed)
Summary: vag irritation/[place]/saxon   Vital Signs:  Patient profile:   30 year old female Weight:      132.9 pounds Temp:     98.9 degrees F oral Pulse rate:   92 / minute Pulse rhythm:   regular BP sitting:   125 / 81  (right arm) Cuff size:   regular  Vitals Entered By: Loralee Pacas CMA (August 28, 2009 4:23 PM) CC: vaginal irritation Is Patient Diabetic? No   Primary Care Provider:  [name] NP  CC:  vaginal irritation.  History of Present Illness: 30 yo F:   1. Vaginal Irritation: x several days, feels raw, had BV and was treated with Metrogel, no discharge, fever/chills, abdominal pain, open lesions. some dysuria.   Allergies (verified): No Known Drug Allergies PMH-FH-SH reviewed for relevance  Review of Systems      See HPI  Physical Exam  General:  Well-developed, well-nourished, in no acute distress; alert, appropriate and cooperative throughout examination. Vitals reviewed. Abdomen:  Bowel sounds positive,abdomen soft and non-tender without masses, organomegaly or hernias noted. Genitalia:  Vulvovaginal erythema with satellite lesions. No open lesions. No vaginal discharge. Psych:  Oriented X3, memory intact for recent and remote, normally interactive, good eye contact, not anxious appearing, and not depressed appearing.     Impression & Recommendations:  Problem # 1:  CANDIDIASIS OF VULVA AND VAGINA (ICD-112.1) Assessment New  Her updated medication list for this problem includes:    Diflucan 150 Mg Tabs (Fluconazole) ..... Once daily x 1, then again 1 week later  Orders: [facility]- Est  Level 4 ([address])  Problem # 2:  DYSURIA (ICD-788.1) Assessment: New  Her updated medication list for this problem includes:    Cephalexin 500 Mg Tabs (Cephalexin) .Marland Kitchen... Take one by mouth two times a day x 3 days  Orders: [facility]- Est  Level 4 ([address])  Problem # 3:  UTI (ICD-599.0) Assessment: New  Her updated medication list for this problem includes:    Cephalexin  500 Mg Tabs (Cephalexin) .Marland Kitchen... Take one by mouth two times a day x 3 days  Orders: [facility]- Est  Level 4 ([address])  Complete Medication List: 1)  Cephalexin 500 Mg Tabs (Cephalexin) .... Take one by mouth two times a day x 3 days 2)  Diflucan 150 Mg Tabs (Fluconazole) .... Once daily x 1, then again 1 week later  Other Orders: Urinalysis-[facility] (00000) Urine Culture-[facility] ([id number])  Patient Instructions: 1)  It was nice to meet you today. 2)  I have sent your prescriptions to the pharmacy. Prescriptions: DIFLUCAN 150 MG TABS (FLUCONAZOLE) once daily x 1, then again 1 week later  #4 x 0   Entered and Authorized by:   Helane Rima DO   Signed by:   Helane Rima DO on 08/28/2009   Method used:   Electronically to        [facility] [address] # [phone]* (retail)       [address]       [place], [place]  [address]       Ph: [phone]       Fax: [phone]   RxID:   [id number] CEPHALEXIN 500 MG  TABS (CEPHALEXIN) take one by mouth two times a day x 3 days  #6 x 0   Entered and Authorized by:   Helane Rima DO   Signed by:   Helane Rima DO on 08/28/2009   Method used:   Electronically to        [facility]  Dr[address] # [phone]* (retail)       [address]       [place], [place]  [address]       Ph: [phone]       Fax: [phone]   RxID:   [id number]   Laboratory Results   Urine Tests  Date/Time Received: August 28, 2009 4:29 PM  Date/Time Reported: August 28, 2009 4:46 PM   Routine Urinalysis   Color: yellow Appearance: Cloudy Glucose: negative   (Normal Range: Negative) Bilirubin: negative   (Normal Range: Negative) Ketone: negative   (Normal Range: Negative) Spec. Gravity: >=1.030   (Normal Range: 1.003-1.035) Blood: trace-intact   (Normal Range: Negative) pH: 6.0   (Normal Range: 5.0-8.0) Protein: trace   (Normal Range: Negative) Urobilinogen: 0.2   (Normal Range: 0-1) Nitrite: negative   (Normal Range: Negative) Leukocyte Esterace: moderate   (Normal  Range: Negative)  Urine Microscopic WBC/HPF: 20+ RBC/HPF: 0-3 Bacteria/HPF: 2+ Mucous/HPF: 2+ Epithelial/HPF: 5-10    Comments: ...........test performed by...........Marland KitchenTerese Door, CMA

## 2010-02-11 NOTE — Progress Notes (Signed)
Summary: Test Res  Phone Note Call from Patient Call back at Home Phone [phone]   Caller: Patient Summary of Call: Pt had urinalysis today and would like a called report also a written report sent to her. Initial call taken by: Clydell Hakim,  March 13, 2009 12:01 PM  Follow-up for Phone Call        will forward to MD. Follow-up by: Theresia Lo RN,  March 13, 2009 12:18 PM    ok will send letter

## 2010-02-11 NOTE — Progress Notes (Signed)
Summary: triage  Phone Note Call from Patient   Caller: Patient Summary of Call: thinks she has BV again and wants to know if something can be called in Initial call taken by: De Nurse,  August 19, 2009 1:41 PM  Follow-up for Phone Call        to pcp Follow-up by: Golden Circle RN,  August 20, 2009 9:42 AM  Additional Follow-up for Phone Call Additional follow up Details #1::        Called patient and discussed at length the nature of BV, and the risks of treating it monthly.  Encouraged her to wait a few days after period and would expect odor to subside.  She may use the gel as needed but will not contiue to prescribe oral metronidazole monthly for odor. Additional Follow-up by: [name] NP,  August 20, 2009 10:01 AM    Prescriptions: METROGEL-VAGINAL 0.75 % GEL (METRONIDAZOLE) one applicator at bedtime for 3 for symptoms, QS  #1 x 3   Entered and Authorized by:   [name] NP   Signed by:   [name] NP on 08/20/2009   Method used:   Electronically to        [facility] Dr[address] # [phone]* (retail)       [address]       [place], [place]  [address]       Ph: [phone]       Fax: [phone]   RxID:   [id number]

## 2010-02-11 NOTE — Letter (Signed)
Summary: Out of Work  [name]  [address]   [name], [place] [address]   Phone: [phone]  Fax: [phone]    December 20, 2009   Employee:  Janet Hughes    To Whom It May Concern:   For Medical reasons, please excuse the above named employee from work for the following dates:  December 20, 2009   If you need additional information, please feel free to contact our office.         Sincerely,    Jimmy Footman, CMA for [name], NP

## 2010-02-11 NOTE — Progress Notes (Signed)
Summary: triage  Phone Note Call from Patient Call back at Home Phone [phone]   Caller: Patient Summary of Call: Pt has several questions concerning her UTI results. Initial call taken by: Clydell Hakim,  September 04, 2009 10:03 AM  Follow-up for Phone Call        states she never had any symptoms. had burning from yeast when she was tested. wanted to know how many colonies & what bacteria. read her note from pcp. she wants to be rechecked friday when her child is here for school exams. will see pcp Follow-up by: Golden Circle RN,  September 04, 2009 10:06 AM

## 2010-02-11 NOTE — Assessment & Plan Note (Signed)
Summary: uti/yeast?,df   Vital Signs:  Patient profile:   30 year old female Weight:      132.6 pounds Temp:     98.9 degrees F oral Pulse rate:   75 / minute BP sitting:   125 / 79  (left arm) Cuff size:   regular  Vitals Entered By: Loralee Pacas CMA (October 14, 2009 4:27 PM) CC: vag discharge Comments having d/c thinks that it bv would like to have pills instead of gel   Primary Care Provider:  [name] NP  CC:  vag discharge.  History of Present Illness: 30 y/o F with is here for vaginal discharge.  She endorses at least 3-4 BV episodes per year.  She does not like to use vaginal treatment.  She is not using the Boric acid suppository for prophylaxis.    She would also like STD check.  She is sexually active with one partner, does not use condoms.    She is also concerned about some dysuria and burning sensation on voiding.  No frequency.  NO fever/chill, nausea/vomiting/abd pain.    LMP: just finished yesterday, normal period.   Current Medications (verified): 1)  Metronidazole 500 Mg Tabs (Metronidazole) .Marland Kitchen.. 1 Tab By Mouth Two Times A Day X 7 Days 2)  Fluconazole 150 Mg Tabs (Fluconazole) .Marland Kitchen.. 1 Tab By Mouth X 1 Time  Allergies (verified): No Known Drug Allergies  Past History:  Past Medical History: Last updated: 11/27/2008 early sexual activity, First child at 70, unmarried,    post-partum depression,  repeated chlamydia infections, TAB 12/01  Past Surgical History: Last updated: 05/05/2007 colposcopy - no bx, pregnant,  ? CIN1 - 05/13/2003 Normal PAP 08/06/06  Family History: Last updated: 03/11/2006 Grandmother - HTN;  Social History: Last updated: 03/14/2008 Lives with daughters Dia Sitter; separated from husband, soon to be divorced.  Lives with boyfriend. Smokes 1 PPD (stopped with pregnancy), stopped 5/09 Graduation from [employer] program 5/09  Risk Factors: Smoking Status: current (09/06/2009) Packs/Day: 0.25 (09/06/2009)  Review of  Systems General:  Denies chills, fatigue, fever, loss of appetite, and weight loss. Resp:  Denies cough, sputum productive, and wheezing. GI:  Denies abdominal pain, change in bowel habits, constipation, diarrhea, nausea, and vomiting. GU:  Complains of abnormal vaginal bleeding, discharge, and dysuria; denies genital sores, hematuria, incontinence, urinary frequency, and urinary hesitancy.  Physical Exam  General:  Well-developed,well-nourished,in no acute distress; alert,appropriate and cooperative throughout examination Genitalia:  Normal introitus for age, no external lesions, + whitish vaginal discharge, mucosa pink and moist, no vaginal or cervical lesions, no vaginal atrophy, no friaility or hemorrhage, normal uterus size and position, no adnexal masses or tenderness Cervical Nodes:  No lymphadenopathy noted Inguinal Nodes:  No significant adenopathy   Impression & Recommendations:  Problem # 1:  DYSURIA (ICD-788.1) Assessment New UA negative.   Her updated medication list for this problem includes:    Metronidazole 500 Mg Tabs (Metronidazole) .Marland Kitchen... 1 tab by mouth two times a day x 7 days  Orders: [facility]- Est  Level 4 ([address])  Problem # 2:  VAGINITIS, BACTERIAL (ICD-616.10) Assessment: New Wet prep +clue cells and +whiff.  Will treat with Flagyl.  I considered changing to Clinda but but has had good result with this.  Unfortunately she is prone to BV and prophylaxis with antibiotic may be useful to her.   Her updated medication list for this problem includes:    Metronidazole 500 Mg Tabs (Metronidazole) .Marland Kitchen... 1 tab by mouth two times  a day x 7 days  Orders: [facility]- Est  Level 4 ([address])  Problem # 3:  SCREENING EXAMINATION FOR VENEREAL DISEASE (ICD-V74.5) Assessment: New Pt is concerned about STDs.  Checked for GC/Chlam, HIV, RPR.  Will call pt with result.  Orders: [facility]- Est  Level 4 ([address])  Complete Medication List: 1)  Metronidazole 500 Mg Tabs (Metronidazole) .Marland Kitchen.. 1 tab  by mouth two times a day x 7 days 2)  Fluconazole 150 Mg Tabs (Fluconazole) .Marland Kitchen.. 1 tab by mouth x 1 time  Other Orders: Urinalysis-[facility] (00000) GC/Chlamydia-[facility] (87591/87491) [facility] HIV-[facility] ([id number]) RPR-[facility] ([id number]) Prescriptions: FLUCONAZOLE 150 MG TABS (FLUCONAZOLE) 1 tab by mouth x 1 time  #1 x 0   Entered and Authorized by:   Angeline Slim MD   Signed by:   Angeline Slim MD on 10/14/2009   Method used:   Electronically to        [facility] [address] # [phone]* (retail)       [address]       [place], [place]  [address]       Ph: [phone]       Fax: [phone]   RxID:   [id number] METRONIDAZOLE 500 MG TABS (METRONIDAZOLE) 1 tab by mouth two times a day x 7 days  #14 x 0   Entered and Authorized by:   Angeline Slim MD   Signed by:   Angeline Slim MD on 10/14/2009   Method used:   Electronically to        [facility] [address] # [phone]* (retail)       [address]       [place], [place]  [address]       Ph: [phone]       Fax: [phone]   RxID:   [id number]   Laboratory Results   Urine Tests  Date/Time Received: October 14, 2009 4:31 PM  Date/Time Reported: October 14, 2009 4:51 PM   Routine Urinalysis   Color: yellow Appearance: Clear Glucose: negative   (Normal Range: Negative) Bilirubin: negative   (Normal Range: Negative) Ketone: trace (5)   (Normal Range: Negative) Spec. Gravity: >=1.030   (Normal Range: 1.003-1.035) Blood: negative   (Normal Range: Negative) pH: 6.5   (Normal Range: 5.0-8.0) Protein: negative   (Normal Range: Negative) Urobilinogen: 0.2   (Normal Range: 0-1) Nitrite: negative   (Normal Range: Negative) Leukocyte Esterace: negative   (Normal Range: Negative)    Comments: .........Marland Kitchenbiochemical negative; microscopic not indicated ...............test performed by......Marland KitchenBonnie A. [place]az[place], MLS (ASCP)cm  Date/Time Received: October 14, 2009 4:31 PM  Date/Time Reported: October 14, 2009 4:56 PM   [employer] Source:  vag WBC/hpf: 1-5 Bacteria/hpf: 3+  Cocci Clue cells/hpf: many  Positive whiff Yeast/hpf: none Trichomonas/hpf: none Comments: ...............test performed by......Marland KitchenBonnie A. [place], MLS (ASCP)cm

## 2010-02-11 NOTE — Progress Notes (Signed)
Summary: triage  Phone Note Call from Patient Call back at Home Phone [phone]   Caller: Patient Summary of Call: Pt thinks she has a yeast infection.  Should she be seen or can something be called in for her? Initial call taken by: Clydell Hakim,  March 28, 2009 10:55 AM  Follow-up for Phone Call        between work & school, she has a hrd time finding time to get here. offered many appt choices. she just started her period. suggested OTC anti fungal to apply to labia to take care of the itching. she uses tampons. she agreed with this plan. states she will call back when menses are over if still itching. Follow-up by: Golden Circle RN,  March 28, 2009 11:02 AM

## 2010-02-11 NOTE — Letter (Signed)
Summary: Out of Work  [facility]  [address]   [place], [place] [address]   Phone: [phone]  Fax: [phone]    February 21, 2009   Employee:  Janet Hughes    To Whom It May Concern:   For Medical reasons, please excuse the above named employee from work for the following dates:  Start:   February 22, 1999  End:  February 22, 1999  If you need additional information, please feel free to contact our office.         Sincerely,    Gladstone Pih

## 2010-02-11 NOTE — Progress Notes (Signed)
Summary: triage  Phone Note Call from Patient Call back at Home Phone [phone]   Caller: Patient Summary of Call: pt is having another problem with discharge - wants to know if she can have something called in.  they use condoms and thinks it could be allergic reaction from that. Initial call taken by: De Nurse,  Jun 04, 2009 9:14 AM  Follow-up for Phone Call        she will see her pcp this am Follow-up by: Golden Circle RN,  Jun 04, 2009 9:16 AM

## 2010-02-11 NOTE — Assessment & Plan Note (Signed)
Summary: recheck urine per pt request/[place]/saxon   Vital Signs:  Patient profile:   30 year old female Height:      65 inches Weight:      134.13 pounds BMI:     22.40 Temp:     98.9 degrees F oral BP sitting:   111 / 75  (left arm)  Vitals Entered By: Terese Door (September 06, 2009 9:17 AM) CC: F/U UTI Is Patient Diabetic? No Pain Assessment Patient in pain? no        Primary Care Provider:  [name] NP  CC:  F/U UTI.  History of Present Illness: Frustrated with recurring BV and subsequent yeast proliferation with both intervaginal and oral metronidazole.  She is not content with letting it run its course.  She is most bothered by odor, and has not always met criteria for BV but insists on being treated.  Habits & Providers  Alcohol-Tobacco-Diet     Tobacco Status: current     Cigarette Packs/Day: 0.25  Current Medications (verified): 1)  Boric Acid Supp .... One Inter Vaginally Daily For One Week of The Month, At Bedtime, Qs For 3 Months  Allergies (verified): No Known Drug Allergies  Social History: Smoking Status:  current Packs/Day:  0.25  Physical Exam  General:  Well-developed,well-nourished,in no acute distress; alert,appropriate and cooperative throughout examination   Impression & Recommendations:  Problem # 1:  UNSPECIFIED VAGINITIS AND VULVOVAGINITIS (ICD-616.10) Will try Boric Acid supp as directed, as monthly oral and intravaginal anerobic treatment results in yeast.  Counseled on common, chronic nature of such and that treating odor alone has resulted in a unhealthy cycle. The following medications were removed from the medication list:    Cephalexin 500 Mg Tabs (Cephalexin) .Marland Kitchen... Take one by mouth two times a day x 3 days  Orders: [facility]- Est Level  3 ([address])  Complete Medication List: 1)  Boric Acid Supp  .... One inter vaginally daily for one week of the month, at bedtime, qs for 3 months  Other Orders: Urinalysis-[facility] (00000)  Patient  Instructions: 1)  [employer]chant [employer] officer; [employer] or [employer] all wil formulate the supp 2)  Use as directed. Prescriptions: BORIC ACID SUPP One inter vaginally daily for one week of the month, at bedtime, QS for 3 months Brand medically necessary #1 x 11   Entered and Authorized by:   [name] NP   Signed by:   [name] NP on 09/06/2009   Method used:   Print then Give to Patient   RxID:   [id number]   Laboratory Results   Urine Tests  Date/Time Received: September 06, 2009 9:41 AM  Date/Time Reported: September 06, 2009 9:43 AM   Routine Urinalysis   Color: yellow Appearance: Clear Glucose: negative   (Normal Range: Negative) Bilirubin: negative   (Normal Range: Negative) Ketone: negative   (Normal Range: Negative) Spec. Gravity: 1.025   (Normal Range: 1.003-1.035) Blood: negative   (Normal Range: Negative) pH: 6.5   (Normal Range: 5.0-8.0) Protein: negative   (Normal Range: Negative) Urobilinogen: 0.2   (Normal Range: 0-1) Nitrite: negative   (Normal Range: Negative) Leukocyte Esterace: negative   (Normal Range: Negative)    Comments: ...........test performed by...........Marland KitchenTerese Door, CMA

## 2010-02-11 NOTE — Assessment & Plan Note (Signed)
Summary: vag prob,df   Vital Signs:  Patient profile:   30 year old female Weight:      136.5 pounds Temp:     98.1 degrees F oral Pulse rate:   75 / minute BP sitting:   126 / 84  (left arm) Cuff size:   regular  Vitals Entered By: Loralee Pacas CMA (April 11, 2009 10:59 AM)  Primary Care Provider:  [name] NP  CC:  vaginal itching and irritation.  History of Present Illness: 1.  vaginal irritation--on minocycline for acne.  has been having vag irritation and itching for about 5 days.  discharge is a little more yellow than usual.  no abd pain, fevers.  no dysuria.  no new sexual partners.  wonders if she has yeast infection.  had recent std check, including wet prep and u/a, all of which was normal.  she also wonders if the condoms she uses may be irritating vaginal mucosa  contact [phone] (OK to leave mssg)  Current Medications (verified): 1)  Metrogel-Vaginal 0.75 % Gel (Metronidazole) .... One Applicator At Bedtime For 3 For Symptoms, Qs 2)  Nicoderm Cq 21 Mg/24hr Pt24 (Nicotine) .... One Daily For One Month 3)  Nicoderm Cq 14 Mg/24hr Pt24 (Nicotine) .... One Daily -Begin in December 4)  Nicoderm Cq 7 Mg/24hr Pt24 (Nicotine) .... One Daily in January 5)  Nicotine Polacrilex 4 Mg Gum (Nicotine Polacrilex) .... Use 1/2 Piece of Gum Each Craving. Up To 6 Times Per Day  Allergies: No Known Drug Allergies  Physical Exam  General:  Well-developed,well-nourished,in no acute distress; alert,appropriate and cooperative throughout examination Genitalia:  Pelvic Exam:        External: normal female genitalia without lesions or masses; mucosa does look slightly erythematous        Vagina: normal without lesions or masses; white discharge present; no odor        Cervix: normal without lesions or masses        Adnexa: normal bimanual exam without masses or fullness        Uterus: normal by palpation        Pap smear: not performed Additional Exam:  vital signs reviewed     Impression & Recommendations:  Problem # 1:  VAGINAL PRURITUS (ICD-698.1) Assessment Deteriorated  rare yeast seen on wet prep.  does have lots of bacteria, but no clue cells and neg whiff test.  will treat empiriaclly for yeast, esp given recent antibiotic use.  also advised to try different condoms in case that is contributing.  check G/C, chlam.  advised to rtc if not better next week.  Orders: [facility]- Est Level  3 ([address])  Complete Medication List: 1)  Metrogel-vaginal 0.75 % Gel (Metronidazole) .... One applicator at bedtime for 3 for symptoms, qs 2)  Nicoderm Cq 21 Mg/24hr Pt24 (Nicotine) .... One daily for one month 3)  Nicoderm Cq 14 Mg/24hr Pt24 (Nicotine) .... One daily -begin in december 4)  Nicoderm Cq 7 Mg/24hr Pt24 (Nicotine) .... One daily in january 5)  Nicotine Polacrilex 4 Mg Gum (Nicotine polacrilex) .... Use 1/2 piece of gum each craving. up to 6 times per day 6)  Fluconazole 150 Mg Tabs (Fluconazole) .Marland Kitchen.. 1 tab by mouth x 1.  may repeat in 48 hours if symptoms not better  [facility]: [facility] ([address]) GC/Chlamydia-[facility] (87591/87491)  Patient Instructions: 1)  It was nice to see you today. 2)  Take the fluconazole I prescribed you. 3)  Try a different  brand of condoms. 4)  I will call you with lab results. 5)  Come back if you are not getting better. Prescriptions: FLUCONAZOLE 150 MG TABS (FLUCONAZOLE) 1 tab by mouth X 1.  may repeat in 48 hours if symptoms not better  #2 x 0   Entered and Authorized by:   Asher Muir MD   Signed by:   Asher Muir MD on 04/11/2009   Method used:   Electronically to        [facility] [address] # [phone]* (retail)       [address]       [place], [place]  [address]       Ph: [phone]       Fax: [phone]   RxID:   [id number]   Laboratory Results  Date/Time Received: April 11, 2009 10:59 AM  Date/Time Reported: April 11, 2009 12:00 PM   Wet [place] Source: vaginal WBC/hpf: 1-5 Bacteria/hpf:  3+  Rods Clue cells/hpf: none  Negative whiff Yeast/hpf: rare Trichomonas/hpf: none Comments: ...........test performed by...........Marland KitchenTerese Door, CMA

## 2010-02-14 ENCOUNTER — Encounter: Payer: Self-pay | Admitting: Family Medicine

## 2010-02-14 ENCOUNTER — Ambulatory Visit (INDEPENDENT_AMBULATORY_CARE_PROVIDER_SITE_OTHER): Payer: Medicaid Other | Admitting: Family Medicine

## 2010-02-14 DIAGNOSIS — J029 Acute pharyngitis, unspecified: Secondary | ICD-10-CM

## 2010-02-14 DIAGNOSIS — R05 Cough: Secondary | ICD-10-CM

## 2010-02-19 ENCOUNTER — Encounter: Payer: Self-pay | Admitting: *Deleted

## 2010-02-19 NOTE — Miscellaneous (Signed)
Summary: denied NPI for another md office  Clinical Lists Changes Miranda from an UC in [place] called for our NPI [phone]). when she picked up, she stated the pt left saying she would "just go there". presumably she will come here.Golden Circle RN  February 14, 2010 10:06 AM

## 2010-02-19 NOTE — Assessment & Plan Note (Signed)
Summary: still congested/hoarse/not getting better/bmc   Vital Signs:  Patient profile:   30 year old female Height:      62 inches Weight:      133.50 pounds BMI:     24.51 BSA:     1.61 Pulse rate:   85 / minute BP sitting:   134 / 80  Vitals Entered By: Jone Baseman CMA (February 14, 2010 2:37 PM) CC: still congested...since monday Is Patient Diabetic? No Pain Assessment Patient in pain? yes     Location: chest Intensity: 4   Primary Care Provider:  [name] NP  CC:  still congested...since monday.  History of Present Illness: 1. Congested:  Has had cough, congestion, and sore throat since Sunday.  Seen by PCP on Monday and diagnosed with viral URI.  Was given some cough syrup however she is not any better.  Still having significant cough.  Has coughing spells that are worse at night.    ROS: denies fevers, chest pain, shortness of breath, sinus pain.  endorses headache.  Habits & Providers  Alcohol-Tobacco-Diet     Tobacco Status: current     Tobacco Counseling: to quit use of tobacco products     Cigarette Packs/Day: 0.5     Year Started: 1988     Year Quit: 01/12/2009     Pack years: 22  Allergies: No Known Drug Allergies  Physical Exam  General:  Vitals reviewed.  Well appearing.  No acute distress Eyes:  normal conjunctiva Ears:  R ear normal and L ear normal.   Nose:  no external erythema and no nasal discharge.   Mouth:  MMM.   Tonsils enlarged bilaterally. No exudate. Neck:  shotty anterior cervical LAD Lungs:  normal respiratory effort, normal breath sounds, no dullness, no crackles, and no wheezes.  Frequent cough. Heart:  normal rate and regular rhythm.   Abdomen:  soft and non-tender.   Skin:  turgor normal, color normal, and no rashes.     Impression & Recommendations:  Problem # 1:  COUGH (ICD-786.2) Assessment Unchanged  Persists for about 1 week.  She had frequent coughing in the visit and has now lost her voice from it.   Will treat with Azithromycin in case.  - strep test.  Orders: [facility]- Est Level  3 (99213)  Complete Medication List: 1)  Guiatuss Ac 100-10 Mg/5ml Syrp (Guaifenesin-codeine) .... 2 teaspoonful q 4-6 hour as needed for cough, 200 cc 2)  Zithromax Z-pak 250 Mg Tabs (Azithromycin) .... Take as directed  Other Orders: Rapid Strep-[facility] (87430) Prescriptions: ZITHROMAX Z-PAK 250 MG TABS (AZITHROMYCIN) Take as directed  #1 x 0   Entered and Authorized by:     MD   Signed by:     MD on 02/14/2010   Method used:   Electronically to        [facility] [address] # [id number]* (retail)       [address]       [place], [place]  [address]       Ph: [phone]       Fax: [phone]   RxID:   [id number]    Orders Added: 1)  Rapid Strep-[facility] [87430] 2)  [facility]- Est Level  3 [99213]    Laboratory Results  Date/Time Received: February 14, 2010 2:56 PM   Date/Time Reported: February 14, 2010 3:12 PM   Other Tests  Rapid Strep: negative Comments: ............................................... Shanda Bumps [facility] February 14, 2010 3:13 PM

## 2010-02-19 NOTE — Assessment & Plan Note (Signed)
Summary: sore throat,df   Vital Signs:  Patient profile:   30 year old female Height:      62 inches Weight:      130.6 pounds BMI:     23.97 Temp:     98.6 degrees F oral Pulse rate:   79 / minute BP sitting:   120 / 79  (right arm)  Vitals Entered By: Arlyss Repress CMA, (February 10, 2010 9:59 AM) CC: sore throat, postnasal drip and headache x 2 days Is Patient Diabetic? No Pain Assessment Patient in pain? yes     Location: head Intensity: 5 Onset of pain  x2d   Primary Care Provider:  [name] NP  CC:  sore throat and postnasal drip and headache x 2 days.  History of Present Illness: Sore throat for 2 days, did not go into work.  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  Thinks her weight is too low, BMI is 24.  Says that she eats all day long and not health.  She really does not eat big meals.  She wonders if she has a tape worm.  Explained we do not have worms in our society with sanitiation and her BMI is normala and on the high side so do not see her has having a low body weight.  Habits & Providers  Alcohol-Tobacco-Diet     Tobacco Status: current     Tobacco Counseling: to quit use of tobacco products     Cigarette Packs/Day: 0.5  Current Medications (verified): 1)  Guiatuss Ac 100-10 Mg/94ml Syrp (Guaifenesin-Codeine) .... 2 Teaspoonful Q 4-6 Hour As Needed For Cough, 200 Cc  Allergies (verified): No Known Drug Allergies  Social History: Packs/Day:  0.5  Review of Systems General:  Denies chills and fever. ENT:  Complains of sore throat. Resp:  Complains of cough; denies wheezing.  Physical Exam  General:  Well-developed,well-nourished,in no acute distress; alert,appropriate and cooperative throughout examination Ears:  TM pink and retracted Nose:  inflammed, clear rhinitis Mouth:  inflammed, neg strep Lungs:  normal respiratory effort and normal breath sounds.   Heart:  normal rate and regular rhythm.     Impression &  Recommendations:  Problem # 1:  SORE THROAT (ICD-462) viral pharyngitis, tussin AC for cough suppression and pain control. The following medications were removed from the medication list:    Metronidazole 500 Mg Tabs (Metronidazole) ..... One two times a day for 7 days  Orders: Rapid Strep-[facility] (30865) [facility]- Est Level  3 (78469)  Complete Medication List: 1)  Guiatuss Ac 100-10 Mg/56ml Syrp (Guaifenesin-codeine) .... 2 teaspoonful q 4-6 hour as needed for cough, 200 cc  Patient Instructions: 1)  Please schedule a follow-up appointment as needed .  Prescriptions: GUIATUSS AC 100-10 MG/5ML SYRP (GUAIFENESIN-CODEINE) 2 teaspoonful q 4-6 hour as needed for cough, 200 cc Brand medically necessary #1 x 0   Entered and Authorized by:   [name] NP   Signed by:   [name] NP on 02/10/2010   Method used:   Print then Give to Patient   RxID:   [id number]    Orders Added: 1)  Rapid Strep-[facility] [72536] 2)  [facility]- Est Level  3 [64403]    Laboratory Results  Date/Time Received: February 10, 2010 10:07 AM  Date/Time Reported: February 10, 2010 10:20 AM   Other Tests  Rapid Strep: negative Comments: ...............test performed by......Marland KitchenBonnie A. [place], MLS (ASCP)cm     Prevention & Chronic Care Immunizations   Influenza vaccine:  Not documented    Tetanus booster: 10/13/1995: Done.   Tetanus booster due: 10/12/2005    Pneumococcal vaccine: Not documented  Other Screening   Pap smear: **ATYPICAL SQUAMOUS CELLS OF UNDETERMINED SIGNIFICANCE  (11/27/2008)   Pap smear due: 06/19/2006   Smoking status: current  (02/10/2010)   Smoking cessation counseling: yes  (12/21/2008)   Target quit date: 01/12/2009  (12/21/2008)

## 2010-02-19 NOTE — Progress Notes (Signed)
Summary: Phn Msg  Phone Note Call from Patient Call back at Home Phone [phone]   Reason for Call: Talk to Nurse Summary of Call: seen yesterday, was prescribed cough syrup but pt is still coughing, her chest is hurting & wants to know if MD wants her to get an xray? Initial call taken by: Knox Royalty,  February 11, 2010 3:18 PM  Follow-up for Phone Call        NO Follow-up by: [name] NP,  February 11, 2010 3:21 PM  Additional Follow-up for Phone Call Additional follow up Details #1::        patient notified. Additional Follow-up by: Theresia Lo RN,  February 11, 2010 4:38 PM

## 2010-04-11 ENCOUNTER — Other Ambulatory Visit (HOSPITAL_COMMUNITY)
Admission: RE | Admit: 2010-04-11 | Discharge: 2010-04-11 | Disposition: A | Discharge status: Home / Self Care | Payer: Medicaid Other | Source: Ambulatory Visit | Attending: Family Medicine | Admitting: Family Medicine

## 2010-04-11 ENCOUNTER — Ambulatory Visit (INDEPENDENT_AMBULATORY_CARE_PROVIDER_SITE_OTHER): Payer: Medicaid Other | Admitting: Family Medicine

## 2010-04-11 ENCOUNTER — Encounter: Payer: Self-pay | Admitting: Family Medicine

## 2010-04-11 DIAGNOSIS — Z124 Encounter for screening for malignant neoplasm of cervix: Secondary | ICD-10-CM

## 2010-04-11 DIAGNOSIS — Z01419 Encounter for gynecological examination (general) (routine) without abnormal findings: Secondary | ICD-10-CM | POA: Insufficient documentation

## 2010-04-11 DIAGNOSIS — Z23 Encounter for immunization: Secondary | ICD-10-CM

## 2010-04-11 DIAGNOSIS — N760 Acute vaginitis: Secondary | ICD-10-CM

## 2010-04-11 DIAGNOSIS — A499 Bacterial infection, unspecified: Secondary | ICD-10-CM

## 2010-04-11 DIAGNOSIS — B9689 Other specified bacterial agents as the cause of diseases classified elsewhere: Secondary | ICD-10-CM

## 2010-04-11 LAB — POCT WET PREP (WET MOUNT): Clue Cells Wet Prep HPF POC: NEGATIVE

## 2010-04-11 MED ORDER — VARENICLINE TARTRATE 0.5 MG X 11 & 1 MG X 42 PO MISC: ORAL | Status: DC

## 2010-04-11 MED ORDER — TERBINAFINE HCL 250 MG PO TABS: 250.0000 mg | ORAL_TABLET | Freq: Every day | ORAL | Status: DC

## 2010-04-11 MED ORDER — LORAZEPAM 0.5 MG PO TABS: 0.5000 mg | ORAL_TABLET | ORAL | Status: DC

## 2010-04-11 MED ORDER — VARENICLINE TARTRATE 1 MG PO TABS: 1.0000 mg | ORAL_TABLET | Freq: Two times a day (BID) | ORAL | Status: DC

## 2010-04-11 NOTE — Patient Instructions (Signed)
Chantix when you are ready Lamisil for toes for 6 weeks

## 2010-04-11 NOTE — Assessment & Plan Note (Signed)
Low dose prn lorazepam for intermittent anxiety

## 2010-04-11 NOTE — Progress Notes (Signed)
Subjective:    Patient ID: Janet Hughes, female    DOB: 1980/07/13, 30 y.o.   MRN: [id number]  HPI  Here for pelvic, PAP and recurrent BV.  Wants to quit smoking and has tried Chantix twice but would like to try a third time.  Self discontinued Wellbutrin, did not feel like herself.  Has only used 5 lorazepam in several months when she feels severe anxiety.  She would like to have a small supply to use as needed.    Review of Systems  Constitutional: Negative for fever, activity change, appetite change and fatigue.  HENT: Negative for congestion and postnasal drip.   Eyes: Negative for visual disturbance.  Respiratory: Negative for cough, shortness of breath and wheezing.   Cardiovascular: Negative for chest pain and leg swelling.  Gastrointestinal: Negative for nausea, vomiting, abdominal pain, diarrhea and blood in stool.  Genitourinary: Negative for dysuria, urgency, frequency, vaginal bleeding, vaginal discharge, menstrual problem and pelvic pain.  Musculoskeletal: Negative for back pain, joint swelling and arthralgias.  Skin: Negative for rash.  Neurological: Negative for dizziness, syncope and headaches.  Psychiatric/Behavioral: Negative for suicidal ideas, dysphoric mood and agitation. The patient is not nervous/anxious.        Objective:   Physical Exam  Constitutional: She is oriented to person, place, and time. She appears well-developed and well-nourished.  HENT:  Mouth/Throat: Oropharynx is clear and moist.  Eyes: EOM are normal. Pupils are equal, round, and reactive to light.  Neck: Normal range of motion. No thyromegaly present.  Cardiovascular: Normal rate, regular rhythm, normal heart sounds and intact distal pulses.   Pulmonary/Chest: Effort normal and breath sounds normal.  Abdominal: Soft. Bowel sounds are normal. There is no tenderness.  Genitourinary: Vaginal discharge found.  Musculoskeletal: Normal range of motion. She exhibits no edema.    Lymphadenopathy:    She has no cervical adenopathy.  Neurological: She is alert and oriented to person, place, and time. She has normal reflexes.  Skin: Skin is warm and dry.  Psychiatric: Her behavior is normal.          Assessment & Plan:   Subjective:     Janet Hughes is a 30 y.o. female and is here for a comprehensive physical exam. The patient reports no problems.  History   Social History  . Marital Status: Single    Spouse Name: N/A    Number of Children: N/A  . Years of Education: N/A   Occupational History  . Not on file.   Social History Main Topics  . Smoking status: Current Everyday Smoker -- 0.5 packs/day    Types: Cigarettes  . Smokeless tobacco: Never Used  . Alcohol Use: Not on file  . Drug Use: Not on file  . Sexually Active: Not on file     early sexual activity   Other Topics Concern  . Not on file   Social History Narrative   Lives with daughters Phylis Bougie and Barrie Dunker and boyfriend. Graduation from [employer] program 5/09, back in school for radiology tech.   Health Maintenance  Topic Date Due  . Tetanus/tdap  02/14/2000  . Pap Smear  11/28/2011    The following portions of the patient's history were reviewed and updated as appropriate: allergies, current medications, past medical history, past social history and problem list.  Review of Systems A comprehensive review of systems was negative.   Objective:    General appearance: alert, cooperative and no distress Head: Normocephalic, without obvious abnormality, atraumatic, thick hair,  no scaling Ears: normal TM's and external ear canals both ears Throat: lips, mucosa, and tongue normal; teeth and gums normal Neck: no adenopathy, no carotid bruit, no JVD, supple, symmetrical, trachea midline and thyroid not enlarged, symmetric, no tenderness/mass/nodules Lungs: clear to auscultation bilaterally Breasts: normal appearance, no masses or tenderness Heart: regular rate and rhythm, S1, S2  normal, no murmur, click, rub or gallop Abdomen: soft, non-tender; bowel sounds normal; no masses,  no organomegaly Pelvic: cervix normal in appearance, external genitalia normal, no adnexal masses or tenderness, no cervical motion tenderness, rectovaginal septum normal, uterus normal size, shape, and consistency and vagina normal without discharge Extremities: extremities normal, atraumatic, no cyanosis or edema Pulses: 2+ and symmetric Skin: Skin color, texture, turgor normal. No rashes or lesions Lymph nodes: Cervical, supraclavicular, and axillary nodes normal.    Assessment:    Healthy female exam. Taking care of herself, going to school and working with 2 kids.    Plan:     See After Visit Summary for Counseling Recommendations

## 2010-04-11 NOTE — Assessment & Plan Note (Signed)
repeat

## 2010-04-11 NOTE — Assessment & Plan Note (Signed)
Wet prep 

## 2010-04-16 ENCOUNTER — Encounter: Payer: Self-pay | Admitting: Family Medicine

## 2010-04-29 ENCOUNTER — Encounter: Payer: Self-pay | Admitting: Family Medicine

## 2010-04-29 ENCOUNTER — Telehealth: Payer: Self-pay | Admitting: *Deleted

## 2010-04-29 NOTE — Telephone Encounter (Signed)
Pt called and request pap smear results. Informed of normal results. Pt wants to know, if her liver functions should be tested, because Birdie Hopes. Put her on Lamisil. Told pt that I will ask Wyvonne Lenz, Renato Battles

## 2010-04-30 NOTE — Telephone Encounter (Signed)
Not necessary with 6 week course in an otherwise healthy individual.

## 2010-05-28 ENCOUNTER — Ambulatory Visit (INDEPENDENT_AMBULATORY_CARE_PROVIDER_SITE_OTHER): Payer: Medicaid Other | Admitting: Family Medicine

## 2010-05-28 ENCOUNTER — Encounter: Payer: Self-pay | Admitting: Family Medicine

## 2010-05-28 VITALS — BP 134/80 | HR 74 | Wt 134.2 lb

## 2010-05-28 DIAGNOSIS — A499 Bacterial infection, unspecified: Secondary | ICD-10-CM

## 2010-05-28 DIAGNOSIS — N898 Other specified noninflammatory disorders of vagina: Secondary | ICD-10-CM

## 2010-05-28 DIAGNOSIS — N760 Acute vaginitis: Secondary | ICD-10-CM

## 2010-05-28 MED ORDER — ACIDOPHILUS PROBIOTIC 100 MG PO CAPS: 100.0000 mg | ORAL_CAPSULE | ORAL | Status: DC

## 2010-05-28 MED ORDER — METRONIDAZOLE 500 MG PO TABS: 500.0000 mg | ORAL_TABLET | Freq: Two times a day (BID) | ORAL | Status: AC

## 2010-05-28 NOTE — Patient Instructions (Signed)
Bacterial Vaginosis (BV)  Bacterial vaginosis (BV) is a vaginal infection where the normal balance of bacteria in the vagina is disrupted. The normal balance is then replaced by an overgrowth of certain bacteria. There are several different kinds of bacteria that can cause BV. BV is the most common vaginal infection in women of childbearing age.  CAUSES   The cause of BV is not fully understood. BV develops when there is an increase or imbalance of harmful bacteria.    Some activities or behaviors can upset the normal balance of bacteria in the vagina and put women at increased risk including:    Having a new sex partner or multiple sex partners.    Douching.    Using an intrauterine device (IUD) for contraception.    It is not clear what role sexual activity plays in the development of BV. However, women that have never had sexual intercourse are rarely infected with BV.   Women do not get BV from toilet seats, bedding, swimming pools or from touching objects around them.   SYMPTOMS   Grey vaginal discharge.    A fish like odor with discharge, especially after sexual intercourse.    Itching or burning of the vagina and vulva.    Burning or pain with urination.    Some women have no signs or symptoms at all.   DIAGNOSIS   Your caregiver must examine the vagina for signs of BV. Your caregiver will perform lab tests and look at the sample of vaginal fluid through a microscope. They will look for bacteria and abnormal cells (clue cells), a pH test higher than 4.5, and a positive amine test all associated with BV.   RISKS AND COMPLICATIONS   Pelvic inflammatory disease (PID).    Infections following gynecology surgery.    Developing HIV.    Developing herpes virus.   TREATMENT  Sometimes BV will clear up without treatment. However, all women with symptoms of BV should be treated to avoid complications, especially if gynecology surgery is planned. Female partners generally do not need to be treated. However,  BV may spread between female sex partners so treatment is helpful in preventing a recurrence of BV.    BV may be treated with medicines that kill germs (antibiotics). The antibiotics come in either pill or vaginal cream forms. Either can be used with non-pregnant or pregnant women, but the recommended dosages differ. These antibiotics are not harmful to the baby.    BV can recur after treatment. If this happens, a second course of antibiotics will often be prescribed.    Treatment is important for pregnant women. If not treated, BV can cause a premature delivery, especially for a pregnant woman who had a premature birth in the past. All pregnant women who have symptoms of BV should be checked and treated.    For chronic reoccurrence of BV, treatment with a type of prescribed gel vaginally twice a week is helpful.   HOME CARE INSTRUCTIONS   Finish all medication as directed by your caregiver.    Do not have sex until treatment is completed.    Tell your sexual partner that you have a vaginal infection. They should see their caregiver and be treated if they have problems, such as a mild rash or itching.    Practice safe sex. Use condoms. Only have one sex partner.   PREVENTION  Basic prevention steps can help reduce the risk of upsetting the natural balance of bacteria in the vagina and   developing BV:   Do not have sexual intercourse (be abstinent).    Do not douche.    Use all of the medicine prescribed for treatment of BV, even if the signs and symptoms go away.    Tell your sex partner if you have BV. That way, they can be treated, if needed, to prevent reoccurrence.   SEEK MEDICAL CARE IF:   Your symptoms are not improving after three days of treatment.    You have increased discharge, pain or fever.   MAKE SURE YOU:    Understand these instructions.    Will watch your condition.    Will get help right away if you are not doing well or get worse.   FOR MORE INFORMATION  Division of STD Prevention  (DSTDP)  Centers for Disease Control and Prevention  www.cdc.gov/std  Order Publications Online at www.cdc.gov/std/pubs/  CDC National Prevention Information Network (NPIN)  E-mail: info@cdcnpin.org  www.cdcnpin.org    American Social Health Association (ASHA)  [address] [address]  [place], [place] [address]  www.ashastd.org   Document Released: 12/29/2004 Document Re-Released: 03/25/2009  ExitCare Patient Information 2011 [employer].

## 2010-05-28 NOTE — Progress Notes (Signed)
  Subjective:    Patient ID: Janet Hughes, female    DOB: September 10, 1980, 30 y.o.   MRN: [id number]  HPI Vaginal discharge x 4 days. + foul odor.  Has history of recurrent BV. No fever, abd pain, dysuria. Most recent unprotected sexual activity 1 week ago. Feels that this is recurrence of BV. Is desiring wet prep.    Review of Systems See HPI     Objective:   Physical Exam Gen-in bed, NAD GYN-normal external genitalia, + vaginal discharge; foul smelling       Assessment & Plan:  BV- Will treat with flagyl. Will also start pt on course of probiotics to hopefully help decrease rate of recurrence. Handout given on BV as well as discussion about use of probiotics. Pt instructed to follow up prn.

## 2010-05-30 DIAGNOSIS — B9689 Other specified bacterial agents as the cause of diseases classified elsewhere: Secondary | ICD-10-CM | POA: Insufficient documentation

## 2010-05-30 NOTE — Op Note (Signed)
Janet Hughes, Janet Hughes            ACCOUNT NO.:  [id number]   MEDICAL RECORD NO.:  [id number]          PATIENT TYPE:  INP   LOCATION:  9314                          FACILITY:  [facility]   PHYSICIAN:  Conni Elliot, M.D.DATE OF BIRTH:  17-Jul-1980   DATE OF PROCEDURE:  02/19/2004  DATE OF DISCHARGE:                                 OPERATIVE REPORT   PREOPERATIVE DIAGNOSIS:  Term intrauterine pregnancy with prolonged fetal  bradycardia and meconium-stained amniotic fluid.   POSTOPERATIVE DIAGNOSES:  1.  Term intrauterine pregnancy with prolonged fetal bradycardia and      meconium-stained amniotic fluid.  2.  Hyperspiral umbilical cord.   OPERATION:  Stat cesarean delivery, low transverse.   OPERATOR:  Conni Elliot, M.D.   ANESTHESIA:  Continuous lumbar epidural.   OPERATIVE FINDINGS:  A female infant with Apgars of 3 and 8, cord pH of  7.168, PCO2 78, __________ of 29.6, bicarb 27.2, base excess -3.7.   OPERATIVE FINDINGS:  The baby was DeLee-suctioned prior to delivery of the  chest.   OPERATIVE PROCEDURE:  The patient's lumbar epidural was topped while the  patient was still in her room.  The patient was immediately prepped and  draped in a sterile fashion when she arrives in the OR.  A Foley catheter  was already in place.  An emergent Pfannenstiel incision was made through  the skin, subcutaneous tissue and fascia, rectus muscles separated in the  midline, peritoneum entered, bladder created, and a low transverse uterine  incision was made.  The baby's head was delivered.  The baby was DeLee-  suctioned.  The remainder of the body was delivered, the cord doubly clamped  and cut, handed to the neonatologist in attendance.  At this point the cord  was extremely  hyperspiral.  Then the placenta would not deliver  spontaneously and required manual removal consistent with __________.  The  placenta was sent to pathology.  The uterus, bladder flap, anterior  peritoneum,  fascia, and skin were closed in the usual fashion.  Estimated  blood loss was [phone] mL without replacement.  The uterus immediately had  decreased tone, so she got Hemabate x1.      ASG/MEDQ  D:  02/19/2004  T:  02/19/2004  Job:  [id number]

## 2010-05-30 NOTE — Assessment & Plan Note (Signed)
Will treat with flagyl. Will also start pt on course of probiotics to hopefully help decrease rate of recurrence. Handout given on BV as well as discussion about use of probiotics. Pt instructed to follow up prn.

## 2010-05-30 NOTE — Discharge Summary (Signed)
NAMEJULIETA, Janet Hughes            ACCOUNT NO.:  [id number]   MEDICAL RECORD NO.:  [id number]          PATIENT TYPE:  INP   LOCATION:  9314                          [facility]ILITY:  [facility]   PHYSICIAN:  Ursula Beath, MD  DATE OF BIRTH:  06-13-1980   DATE OF ADMISSION:  02/19/2004  DATE OF DISCHARGE:  02/23/2004                                 DISCHARGE SUMMARY   DISCHARGE DIAGNOSES:  1.  Intrauterine pregnancy.  2.  Constipation.   PROCEDURE:  Low transverse cesarean section for nonreassuring fetal heart  tones.   DISCHARGE MEDICATIONS:  1.  Ibuprofen 600 mg one p.o. q.6h. p.r.n.  2.  Percocet one to two tablets q.4-6h. p.r.n.  3.  Mircette one p.o. daily starting two weeks after discharge.  4.  Colace 100 mg one p.o. daily or b.i.d.  5.  Dulcolax suppository one PR daily p.r.n.  6.  Tylenol 325 mg two tablets p.o. q.4-6h. p.r.n. though advised her not to      take these at the same time as Percocet.  7.  Iron sulfate 325 mg p.o. b.i.d.   DIET:  Regular.   ACTIVITY:  As per instructions provided in her cesarean section booklet.   FOLLOW UP:  [facility] in six weeks.  She is instructed to call for an appointment.  She was also instructed to  return to the MAU on Tuesday, February 26, 2004 for removal of her surgical  staples.   HOSPITAL COURSE:  [name] is a 31 year old G3, P1-0-1-1 presenting at 79-  2/7 weeks in active labor.  She was subsequently taken to cesarean section  for nonreassuring fetal heart tones.  On February 7th, she delivered via  cesarean section a female infant with Apgars at one minute of 3 and at five  minutes of 8.  The infant weighed 6 pounds and 9 ounces and was transferred  to the NICU secondary to meconium aspiration.  At the time of discharge, it  was anticipated that the baby would be removed from the ventilator, as she  was improving.  The patient did well  and was learning how to use the breast pump at the  time of discharge.  She  will return to [facility] for the above-mentioned followup  with Dr. Ace Gins.   Discharge instructions were discussed with the patient, and she expressed  understanding.      JT/MEDQ  D:  02/23/2004  T:  02/24/2004  Job:  [id number]   cc:   Ace Gins, MD  Fax: [phone]

## 2010-06-06 ENCOUNTER — Ambulatory Visit: Payer: Medicaid Other | Admitting: Family Medicine

## 2010-07-07 ENCOUNTER — Other Ambulatory Visit: Payer: Self-pay | Admitting: Family Medicine

## 2010-07-07 MED ORDER — POLYETHYLENE GLYCOL 3350 17 GM/SCOOP PO POWD: 17.0000 g | Freq: Every day | ORAL | Status: DC

## 2010-07-07 NOTE — Telephone Encounter (Signed)
Forward to [place], Rx request.Janet Hughes, Janet Hughes

## 2010-07-07 NOTE — Telephone Encounter (Signed)
Refills for Miralax (Polyethylene Glycol Powder) that expired in 2010 and she just finished using it and needs a new refill, [facility] [address].

## 2010-07-08 ENCOUNTER — Telehealth: Payer: Self-pay | Admitting: Family Medicine

## 2010-07-08 MED ORDER — POLYETHYLENE GLYCOL 3350 17 GM/SCOOP PO POWD: 17.0000 g | Freq: Every day | ORAL | Status: DC

## 2010-07-08 NOTE — Telephone Encounter (Signed)
Needed a larger bottle of the polyethylene glycol powder (GLYCOLAX) powder  Got the smaller one and did not pick it up.  pls call in larger bottle

## 2010-07-28 ENCOUNTER — Telehealth: Payer: Self-pay | Admitting: Family Medicine

## 2010-07-28 NOTE — Telephone Encounter (Signed)
[facility] on [address] asked her to call the office and request her refill for [employer].

## 2010-07-28 NOTE — Telephone Encounter (Signed)
This was done.

## 2010-07-31 ENCOUNTER — Other Ambulatory Visit: Payer: Self-pay | Admitting: Family Medicine

## 2010-07-31 NOTE — Telephone Encounter (Signed)
Fwd to pcp

## 2010-07-31 NOTE — Telephone Encounter (Signed)
Pt checking status of rx for her birth control, the pharmacy never received it, pt needs loestrin 24

## 2010-08-01 MED ORDER — NORETHIN ACE-ETH ESTRAD-FE 1-20 MG-MCG(24) PO TABS: 1.0000 | ORAL_TABLET | Freq: Every day | ORAL | Status: DC

## 2010-08-18 ENCOUNTER — Ambulatory Visit: Payer: Medicaid Other

## 2010-08-31 ENCOUNTER — Other Ambulatory Visit: Payer: Self-pay | Admitting: Family Medicine

## 2010-09-01 NOTE — Telephone Encounter (Signed)
Refill request

## 2010-09-28 ENCOUNTER — Other Ambulatory Visit: Payer: Self-pay | Admitting: Family Medicine

## 2010-09-28 NOTE — Telephone Encounter (Signed)
Refill request

## 2010-10-02 ENCOUNTER — Encounter: Payer: Self-pay | Admitting: Family Medicine

## 2010-10-02 ENCOUNTER — Ambulatory Visit (INDEPENDENT_AMBULATORY_CARE_PROVIDER_SITE_OTHER): Payer: Medicaid Other | Admitting: Family Medicine

## 2010-10-02 VITALS — BP 127/86 | HR 67 | Temp 99.4°F | Ht 64.0 in | Wt 142.0 lb

## 2010-10-02 DIAGNOSIS — J029 Acute pharyngitis, unspecified: Secondary | ICD-10-CM

## 2010-10-02 LAB — POCT RAPID STREP A (OFFICE): Rapid Strep A Screen: NEGATIVE

## 2010-10-02 MED ORDER — BENZONATATE 100 MG PO CAPS: 100.0000 mg | ORAL_CAPSULE | Freq: Three times a day (TID) | ORAL | Status: AC | PRN

## 2010-10-02 MED ORDER — CETIRIZINE HCL 5 MG PO TABS: 5.0000 mg | ORAL_TABLET | Freq: Every day | ORAL | Status: DC

## 2010-10-02 NOTE — Progress Notes (Signed)
  Subjective:     Janet Hughes is a 30 y.o. female here for evaluation of a cough. Onset of symptoms was 4 days ago. Symptoms have been stable since that time. The cough is nonproductive and painful and is aggravated by nothing. Associated symptoms include: fatigue. Patient does not have a history of asthma. Patient does not have a history of environmental allergens. Patient has not traveled recently. Patient does not have a history of smoking. Patient has not had a previous chest x-ray. Patient has not had a PPD done.  The following portions of the patient's history were reviewed and updated as appropriate: allergies, current medications, past family history, past medical history, past social history, past surgical history and problem list.  Review of Systems Constitutional: negative Eyes: negative Ears, nose, mouth, throat, and face: positive for sore throat, negative for earaches, epistaxis and nasal congestion Respiratory: negative for asthma, chronic bronchitis, hemoptysis, pleurisy/chest pain, pneumonia and sputum Gastrointestinal: negative Genitourinary:negative    Objective:     BP 127/86  Pulse 67  Temp(Src) 99.4 F (37.4 C) (Oral)  Ht 5\' 4"  (1.626 m)  Wt 142 lb (64.411 kg)  BMI 24.37 kg/m2 General appearance: alert and cooperative Head: Normocephalic, without obvious abnormality, atraumatic Eyes: conjunctivae/corneas clear. PERRL, EOM's intact. Fundi benign. Ears: normal TM's and external ear canals both ears Nose: Nares normal. Septum midline. Mucosa normal. No drainage or sinus tenderness. Throat: lips, mucosa, and tongue normal; teeth and gums normal Neck: no adenopathy, no carotid bruit, no JVD, supple, symmetrical, trachea midline and thyroid not enlarged, symmetric, no tenderness/mass/nodules Back: symmetric, no curvature. ROM normal. No CVA tenderness. Lungs: clear to auscultation bilaterally Heart: regular rate and rhythm, S1, S2 normal, no murmur, click, rub  or gallop Abdomen: soft, non-tender; bowel sounds normal; no masses,  no organomegaly Pulses: 2+ and symmetric    Assessment:    Cough and URI with Post Nasal Drip    Plan:    Explained lack of efficacy of antibiotics in viral disease. Antitussives per medication orders. Avoid exposure to tobacco smoke and fumes. Call if shortness of breath worsens, blood in sputum, change in character of cough, development of fever or chills, inability to maintain nutrition and hydration. Avoid exposure to tobacco smoke and fumes.

## 2010-10-02 NOTE — Patient Instructions (Signed)
  Place cough patient instructions here.  

## 2010-10-06 ENCOUNTER — Telehealth: Payer: Self-pay | Admitting: Family Medicine

## 2010-10-06 DIAGNOSIS — R05 Cough: Secondary | ICD-10-CM

## 2010-10-06 NOTE — Telephone Encounter (Signed)
Pt states that the tessalon pearls do not work and wants to know if something else can be called in for her cough [facility] [address]

## 2010-10-06 NOTE — Telephone Encounter (Signed)
Will forward to Dr. Rivka Safer.Loralee Pacas [place]

## 2010-10-06 NOTE — Telephone Encounter (Signed)
I did not see the patient, please send to Dr. Rivka Safer

## 2010-10-07 ENCOUNTER — Telehealth: Payer: Self-pay | Admitting: Family Medicine

## 2010-10-07 MED ORDER — GUAIFENESIN-DM 100-10 MG/5ML PO SYRP: 5.0000 mL | ORAL_SOLUTION | Freq: Three times a day (TID) | ORAL | Status: AC | PRN

## 2010-10-07 NOTE — Telephone Encounter (Signed)
Janet Hughes is still having persistent coughing inspite of meds prescribed.  Nothing is quelling the cough.  Requesting something else for relief

## 2010-10-07 NOTE — Telephone Encounter (Signed)
Prescribed Robitussin DM

## 2010-10-08 ENCOUNTER — Other Ambulatory Visit: Payer: Self-pay | Admitting: Family Medicine

## 2010-10-08 DIAGNOSIS — R05 Cough: Secondary | ICD-10-CM

## 2010-10-08 MED ORDER — ACETAMINOPHEN-CODEINE #3 300-30 MG PO TABS: 1.0000 | ORAL_TABLET | ORAL | Status: AC | PRN

## 2010-10-08 NOTE — Telephone Encounter (Signed)
Patient can come in to get a script for codeine. Thank you,

## 2010-10-08 NOTE — Telephone Encounter (Signed)
Spoke with pt and she stated that she has been taking the Robitussin DM and she is not having any better results. She works at a [employer] and one of the dr's there suggested that she get a CXR. I told her that I would forward this information to her pcp to see what she or Dr. Rivka Safer would suggest regarding this.Laureen Ochs, Viann Shove

## 2010-10-10 ENCOUNTER — Ambulatory Visit (INDEPENDENT_AMBULATORY_CARE_PROVIDER_SITE_OTHER): Payer: Medicaid Other | Admitting: Family Medicine

## 2010-10-10 ENCOUNTER — Encounter: Payer: Self-pay | Admitting: Family Medicine

## 2010-10-10 DIAGNOSIS — R05 Cough: Secondary | ICD-10-CM

## 2010-10-10 DIAGNOSIS — R058 Other specified cough: Secondary | ICD-10-CM

## 2010-10-10 DIAGNOSIS — R059 Cough, unspecified: Secondary | ICD-10-CM | POA: Insufficient documentation

## 2010-10-10 NOTE — Assessment & Plan Note (Addendum)
Her symptoms are consistent with a post viral cough. Recommended a humidifier in Zyrtec. Reviewed signs and symptoms that would prompt return to clinic.

## 2010-10-10 NOTE — Patient Instructions (Signed)
Thank you for coming in today. You don't have chronic cough yet.  Most coughs will get better in < 6 weeks.  I expect you to be feeling better in <2 weeks.  My advise is to try a humidifier in your room at night.  Try certazine 10mg  daily for 1 month.  Try taking 1/2 to 1/4 of the tylenole 3 for cough.  If you have a fever or chills or trouble breathing call or come back.   This is a normal post viral cough. I wish we had more.

## 2010-10-10 NOTE — Progress Notes (Signed)
Janet Hughes presents to clinic today with continued cough. Her cough started 8 days ago. She was seen by Dr. Rivka Safer at that point he prescribe [name] which have not worked and Tylenol No. 3 which makes her nauseated. Her cough is worse at night. Initially she had fevers and chills. However those have resolved. She denies any burning sore throat or nasal stuffiness. She denies any dyspnea or wheeze.  Exam:  BP 127/80  Pulse 78  Temp(Src) 98.3 F (36.8 C) (Oral)  Wt 141 lb 4.8 oz (64.093 kg)  LMP 10/06/2010 Gen: Well NAD HEENT: EOMI,  MMM, nasal congestion Lungs: CTABL Nl WOB Heart: RRR no MRG Abd: NABS, NT, ND Exts: Non edematous BL  LE, warm and well perfused.

## 2010-10-11 ENCOUNTER — Encounter: Payer: Self-pay | Admitting: Family Medicine

## 2010-10-23 ENCOUNTER — Other Ambulatory Visit: Payer: Self-pay | Admitting: Family Medicine

## 2010-10-24 ENCOUNTER — Ambulatory Visit: Payer: Self-pay | Admitting: Family Medicine

## 2010-10-31 ENCOUNTER — Encounter: Payer: Self-pay | Admitting: Family Medicine

## 2010-10-31 ENCOUNTER — Ambulatory Visit (INDEPENDENT_AMBULATORY_CARE_PROVIDER_SITE_OTHER): Payer: Self-pay | Admitting: Family Medicine

## 2010-10-31 DIAGNOSIS — R05 Cough: Secondary | ICD-10-CM

## 2010-10-31 MED ORDER — AZITHROMYCIN 250 MG PO TABS: ORAL_TABLET | ORAL | Status: AC

## 2010-10-31 NOTE — Patient Instructions (Signed)
I think it is probably still post nasal drainage contributing to your cough  Try an antihistamine with a decongestant (pseudoephedrine behind the couner) and a nasal saline rinse  Can use azithromycin if does not resolve.

## 2010-10-31 NOTE — Assessment & Plan Note (Signed)
I discussed with patient that cough is most likely post viral with some residual postnasal drainage.  We discussed using a decongestant and nasal saline rinse to help with this in addition to her and histamine. She has been very frustrated at the prolonged course and would like to discuss antibiotic treatment. I have given her a written prescription for azithromycin and stated that she may fill it if supportive measures do not help improve her cough.

## 2010-10-31 NOTE — Progress Notes (Signed)
  Subjective:    Patient ID: Janet Hughes, female    DOB: May 01, 1980, 30 y.o.   MRN: [id number]  HPI patient is here today for a same day appointment for over 4 weeks of cough and nasal congestion.  Patient states she was in the office 4 weeks ago and was diagnosed with a viral URI. Symptoms at that time were nasal congestion, and cough. She returned to the office several weeks later and was diagnosed with a post viral cough.    She was prescribed codeine which has helped with her night cough.  She has also tried taking Claritin over-the-counter without any improvement  She denies any dyspnea, fever, chills, abdominal pain, nausea, diarrhea, rash .    Review of Systemssee hpi     Objective:   Physical Exam  GEN: Alert & Oriented, No acute distress HEENT: [place]/AT. EOMI, PERRLA, no conjunctival injection or scleral icterus.  Bilateral tympanic membranes intact without erythema or effusion.  .  Nares with edema.  Oropharynx is without erythema or exudates.  No anterior or posterior cervical lymphadenopathy. CV:  Regular Rate & Rhythm, no murmur Respiratory:  Normal work of breathing, CTAB Abd:  + BS, soft, no tenderness to palpation        Assessment & Plan:

## 2010-11-24 ENCOUNTER — Encounter: Payer: Self-pay | Admitting: Family Medicine

## 2010-11-24 ENCOUNTER — Ambulatory Visit (INDEPENDENT_AMBULATORY_CARE_PROVIDER_SITE_OTHER): Payer: Self-pay | Admitting: Family Medicine

## 2010-11-24 VITALS — BP 124/78 | HR 77 | Temp 98.5°F | Ht 64.0 in | Wt 139.4 lb

## 2010-11-24 DIAGNOSIS — K649 Unspecified hemorrhoids: Secondary | ICD-10-CM

## 2010-11-24 MED ORDER — HYDROCORTISONE ACE-PRAMOXINE 1-1 % RE FOAM: 1.0000 | Freq: Two times a day (BID) | RECTAL | Status: AC

## 2010-11-24 NOTE — Patient Instructions (Signed)
Use the Proctofoam twice daily for the next week or so. If you start having extreme pain, come back or go to the ED/Urgent Care. We will get you set back up with GI again.   It was good to meet you today.

## 2010-11-24 NOTE — Progress Notes (Signed)
  Subjective:    Patient ID: Janet Hughes, female    DOB: [birth year], 29 y.o.   MRN: [id number]  HPI 1.  Hemorrhoid:  Patient has 11 year history of this since birth of her 1st daughter.  Usually resolves with Preparation H or Tucks pads.  She first noted hemorrhoid about 5 days ago.  Did not resolve with usual treatments.  Here for further evaluation.  Describes no bleeding, does have some itching.  Also with pain described as mild and controlled with OTC Ibuprofen.  Sitting actually makes pain better, standing worse.  No fevers or chills. Only minimal pain when having bowel movement.  Denies constipation or diarrhea.    Review of Systems See HPI above for review of systems.       Objective:   Physical Exam Gen:  Alert, cooperative patient who appears stated age in no acute distress.  Vital signs reviewed. Rectal:  (exam performed with chaperone present)  0.5 cm non-thrombosed external hemorrhoid present.  Unable to reduce.  Only minimal tenderness to palpation.         Assessment & Plan:

## 2010-11-24 NOTE — Assessment & Plan Note (Signed)
Proctofoam for conservative treatment.   She has been seen by GI in past for this and recommended for sclerotherapy; however resolved before she could be treated. Plan to re-refer, I'm not sure this will resolve.   See instructions - gave warnings/red flags if thrombosed.

## 2010-11-25 ENCOUNTER — Telehealth: Payer: Self-pay | Admitting: Family Medicine

## 2010-11-25 DIAGNOSIS — K649 Unspecified hemorrhoids: Secondary | ICD-10-CM

## 2010-11-25 MED ORDER — DIBUCAINE POWD: Status: DC

## 2010-11-25 NOTE — Telephone Encounter (Signed)
Forwarded to Dr. Walden.Nahiara Kretzschmar [place]  

## 2010-11-25 NOTE — Telephone Encounter (Signed)
Pt seen yesterday for hemorroids, is asking if she can be prescribed dibucaine instead, has used it in the past with success. [facility] goes to [facility]

## 2010-11-25 NOTE — Telephone Encounter (Signed)
Done

## 2010-11-26 ENCOUNTER — Other Ambulatory Visit: Payer: Self-pay | Admitting: *Deleted

## 2010-11-26 MED ORDER — NORETHIN ACE-ETH ESTRAD-FE 1-20 MG-MCG(24) PO TABS: 1.0000 | ORAL_TABLET | Freq: Every day | ORAL | Status: DC

## 2010-11-26 NOTE — Progress Notes (Signed)
Addended by: Stanton Cellar on: 11/26/2010 12:23 PM   Modules accepted: Orders

## 2010-11-27 ENCOUNTER — Other Ambulatory Visit: Payer: Self-pay | Admitting: Family Medicine

## 2010-11-27 DIAGNOSIS — K649 Unspecified hemorrhoids: Secondary | ICD-10-CM

## 2010-11-28 ENCOUNTER — Telehealth: Payer: Self-pay | Admitting: *Deleted

## 2010-12-01 NOTE — Telephone Encounter (Signed)
Called to confirm pt's insurance and she stated that at present she does not currently have any. Informed her that she will need to pay $260 to [facility] and arrange a payment plan thereafter. Pt stated that once she is ready to make the appt she will call back and let us know.Loralee Pacas [place]

## 2010-12-16 ENCOUNTER — Ambulatory Visit: Payer: Self-pay | Admitting: Family Medicine

## 2010-12-18 ENCOUNTER — Ambulatory Visit: Payer: Self-pay | Admitting: Family Medicine

## 2010-12-19 ENCOUNTER — Ambulatory Visit (INDEPENDENT_AMBULATORY_CARE_PROVIDER_SITE_OTHER): Payer: Medicaid Other | Admitting: Family Medicine

## 2010-12-19 ENCOUNTER — Encounter: Payer: Self-pay | Admitting: Family Medicine

## 2010-12-19 DIAGNOSIS — K649 Unspecified hemorrhoids: Secondary | ICD-10-CM

## 2010-12-19 NOTE — Progress Notes (Signed)
  Subjective:    Patient ID: Janet Hughes, female    DOB: Jun 09, 1980, 30 y.o.   MRN: [id number]  HPI    Review of Systems     Objective:   Physical Exam        Assessment & Plan:

## 2010-12-23 NOTE — Progress Notes (Signed)
  Subjective:    Patient ID: Janet Hughes, female    DOB: 01/09/1981, 30 y.o.   MRN: [id number]  HPI  Patient needed to leave before being seen and rescheduled  Review of Systems     Objective:   Physical Exam        Assessment & Plan:

## 2010-12-25 ENCOUNTER — Ambulatory Visit (INDEPENDENT_AMBULATORY_CARE_PROVIDER_SITE_OTHER): Payer: Medicaid Other | Admitting: Family Medicine

## 2010-12-25 ENCOUNTER — Encounter: Payer: Self-pay | Admitting: Family Medicine

## 2010-12-25 VITALS — BP 134/80 | HR 78 | Ht 64.0 in | Wt 135.9 lb

## 2010-12-25 DIAGNOSIS — R87610 Atypical squamous cells of undetermined significance on cytologic smear of cervix (ASC-US): Secondary | ICD-10-CM

## 2010-12-25 DIAGNOSIS — F331 Major depressive disorder, recurrent, moderate: Secondary | ICD-10-CM

## 2010-12-25 DIAGNOSIS — Z3041 Encounter for surveillance of contraceptive pills: Secondary | ICD-10-CM | POA: Insufficient documentation

## 2010-12-25 MED ORDER — ALPRAZOLAM 0.25 MG PO TABS: 0.5000 mg | ORAL_TABLET | Freq: Every evening | ORAL | Status: DC | PRN

## 2010-12-25 MED ORDER — NORETHIN ACE-ETH ESTRAD-FE 1-20 MG-MCG(24) PO TABS: 1.0000 | ORAL_TABLET | Freq: Every day | ORAL | Status: DC

## 2010-12-25 MED ORDER — ALPRAZOLAM 0.5 MG PO TABS: 0.5000 mg | ORAL_TABLET | Freq: Every evening | ORAL | Status: DC | PRN

## 2010-12-25 NOTE — Progress Notes (Signed)
  Subjective:    Patient ID: Janet Hughes, female    DOB: Oct 21, 1980, 30 y.o.   MRN: [id number]  HPI 30 yo female who presents to meet the new PCP and to get refills on xanax and birth control pills. She has no complaints or concerns. She takes xanax once or twice per month, mostly to use in anxiety and stress provoquing situations like meeting with her boss or going to parent teacher conference. She has not had to use it in the last two months that she has run out. She describes finding it sometimes stressful dealing with her 29yo who has ADHD. She however feels very supported by her fiance at home. She does not feel anxious on most days.   Smoking cessation: reports having quit smoking 3-4 months ago. Quit with chantix. Also uses electronic cigarettes which helps her with the hand to mouth habit. She is trying to get her fiance to quit smoking.  Birth control pill: works well for her. She reports some periods of amenorrhea which she does not mind. LMP: 11/19/10   Review of Systems Negative except per HPI    Objective:   Physical Exam Filed Vitals:   12/25/10 1407  BP: 134/80  Pulse: 78   Physical Examination: General appearance - alert, well appearing, and in no distress Nose - normal and patent, no erythema, discharge or polyps Mouth - mucous membranes moist, pharynx normal without lesions Neck - supple, no significant adenopathy Chest - clear to auscultation, no wheezes, rales or rhonchi, symmetric air entry Heart - normal rate, regular rhythm, normal S1, S2, no murmurs, rubs, clicks or gallops Abdomen - soft, nontender, nondistended, no masses or organomegaly       Assessment & Plan:

## 2010-12-25 NOTE — Assessment & Plan Note (Addendum)
Last PAP smear in March 2012 was normal. She had an ASCUS pap in 2010. No record of biopsy. Patient to repeat pap in one year of last pap.

## 2010-12-25 NOTE — Assessment & Plan Note (Signed)
Will refill on xanax 0.5mg . She does not use it on a regular basis. Other than during episodes of elevated stress, her anxiety appears to be controled.

## 2010-12-25 NOTE — Assessment & Plan Note (Signed)
Will refill birth control pills since this method of birth control works well for her.

## 2010-12-25 NOTE — Patient Instructions (Signed)
It was nice to meet you today! I sent the birth control refill to the pharmacy.

## 2011-01-20 ENCOUNTER — Ambulatory Visit (INDEPENDENT_AMBULATORY_CARE_PROVIDER_SITE_OTHER): Payer: Medicaid Other | Admitting: Emergency Medicine

## 2011-01-20 ENCOUNTER — Encounter: Payer: Self-pay | Admitting: Emergency Medicine

## 2011-01-20 VITALS — BP 126/80 | HR 71 | Ht 64.0 in | Wt 135.0 lb

## 2011-01-20 DIAGNOSIS — R590 Localized enlarged lymph nodes: Secondary | ICD-10-CM

## 2011-01-20 DIAGNOSIS — R599 Enlarged lymph nodes, unspecified: Secondary | ICD-10-CM

## 2011-01-20 HISTORY — DX: Localized enlarged lymph nodes: R59.0

## 2011-01-20 NOTE — Progress Notes (Signed)
  Subjective:    Patient ID: Janet Hughes, female    DOB: Jul 24, 1980, 31 y.o.   MRN: [id number]  HPI [name] is here today for an acute visit for bumps on her neck.  She notices two bumps on the back of her neck in her hairline about 10 days ago.  They have decreased in size since then.  She was sick with a cold in the last month. Denies andy scalp lesions. Has not noticed any other bumps on her body.  The bumps are slightly tender when she presses on them.  Denies night sweats, fever, chills, weight loss.   Review of Systems Please see HPI, otherwise negative    Objective:   Physical Exam Vitals: reviewed General: healthy appear young woman in no distress HEENT: sclera white, MMM, oropharynx clear without erythema or exudate Neck: two small (<0.5cm), soft, mobile, slightly tender to palpation lymph nodes at superior posterior cervical chain vs occipital.  Pulm: CTAB, no wheezes, rales      Assessment & Plan:

## 2011-01-20 NOTE — Assessment & Plan Note (Signed)
History and exam consisted with reactive lymph nodes.  Discussed that it may take another several weeks for the lymph nodes to disappear again.  Also discussed that they may enlarge with future URIs.  Patient to return if worsening or develops additional lymph nodes in axilla or groin.

## 2011-01-20 NOTE — Patient Instructions (Signed)
The bumps on the back of your neck are lymph nodes.  They enlarged in response to an infection and will take several weeks to go back to normal.  They may enlarge again with future respiratory infections.

## 2011-01-31 ENCOUNTER — Emergency Department (HOSPITAL_COMMUNITY)
Admission: EM | Admit: 2011-01-31 | Discharge: 2011-01-31 | Disposition: A | Discharge status: Home / Self Care | Payer: Medicaid Other | Source: Home / Self Care

## 2011-01-31 NOTE — ED Notes (Signed)
Patient informed registration staff she was leaving because the wait time was too long.  Pt stated another urgent care did not have a wait.

## 2011-03-16 ENCOUNTER — Encounter: Payer: Self-pay | Admitting: Family Medicine

## 2011-03-16 ENCOUNTER — Ambulatory Visit (INDEPENDENT_AMBULATORY_CARE_PROVIDER_SITE_OTHER): Payer: Medicaid Other | Admitting: Family Medicine

## 2011-03-16 DIAGNOSIS — F331 Major depressive disorder, recurrent, moderate: Secondary | ICD-10-CM

## 2011-03-16 DIAGNOSIS — L709 Acne, unspecified: Secondary | ICD-10-CM

## 2011-03-16 DIAGNOSIS — L708 Other acne: Secondary | ICD-10-CM

## 2011-03-18 DIAGNOSIS — L709 Acne, unspecified: Secondary | ICD-10-CM | POA: Insufficient documentation

## 2011-03-18 NOTE — Assessment & Plan Note (Signed)
Mild acne that is nonetheless bothersome to patient. Will refer her to Dr. Yetta Barre with dermatology.

## 2011-03-18 NOTE — Progress Notes (Signed)
Patient ID: Janet Hughes, female   DOB: Apr 18, 1980, 31 y.o.   MRN: [id number] Patient ID: Janet Hughes    DOB: 11-Oct-1980, 31 y.o.   MRN: [id number] --- Subjective:  [name] is a 31 y.o.female who presents to make a referral for dermatology for her acne. She has been seen Dr. Karlyn Agee since she was a teenager. She feels like her acne gets worst after her period. She denies any infections. She used to be on acutane in 2011.   As far as her anxiety goes, she feels like she has been using xanax more often than before, about 1.5 tabs per week. She reports some stress with her teenage daughter as well as with her boss at work.    Objective: Filed Vitals:   03/16/11 1558  BP: 118/72  Pulse: 72  Temp: 98.4 F (36.9 C)    Physical Examination:   General appearance - alert, well appearing, and in no distress Chest - clear to auscultation, no wheezes, rales or rhonchi, symmetric air entry Heart - normal rate, regular rhythm, normal S1, S2, no murmurs, rubs, clicks or gallops Skin - small acne scars and nodules, non erythematous, on forehead and cheeks.

## 2011-03-18 NOTE — Assessment & Plan Note (Signed)
Patient mentions that she has used celexa and wellbutrin in the past for depression and anxiety but did not like the GI side effects or the feeling after she stopped taking them.  Recommended sertraline 25mg  to be titrated in 2-3 weeks up to max 100mg . Also mentioned that side effects would resolve after a couple weeks. Patient wanted to think about it and talk it over with her fiance. I let her know that if she chooses to try taking it, she can call the office and I will prescribe it to her.

## 2011-04-28 ENCOUNTER — Encounter: Payer: Self-pay | Admitting: Family Medicine

## 2011-04-28 ENCOUNTER — Other Ambulatory Visit (HOSPITAL_COMMUNITY)
Admission: RE | Admit: 2011-04-28 | Discharge: 2011-04-28 | Disposition: A | Discharge status: Home / Self Care | Payer: Medicaid Other | Source: Ambulatory Visit | Attending: Family Medicine | Admitting: Family Medicine

## 2011-04-28 ENCOUNTER — Ambulatory Visit (INDEPENDENT_AMBULATORY_CARE_PROVIDER_SITE_OTHER): Payer: Medicaid Other | Admitting: Family Medicine

## 2011-04-28 VITALS — BP 116/81 | HR 68 | Temp 98.8°F | Ht 63.0 in | Wt 140.7 lb

## 2011-04-28 DIAGNOSIS — Z720 Tobacco use: Secondary | ICD-10-CM

## 2011-04-28 DIAGNOSIS — N760 Acute vaginitis: Secondary | ICD-10-CM

## 2011-04-28 DIAGNOSIS — R87610 Atypical squamous cells of undetermined significance on cytologic smear of cervix (ASC-US): Secondary | ICD-10-CM

## 2011-04-28 DIAGNOSIS — Z0000 Encounter for general adult medical examination without abnormal findings: Secondary | ICD-10-CM

## 2011-04-28 DIAGNOSIS — Z124 Encounter for screening for malignant neoplasm of cervix: Secondary | ICD-10-CM

## 2011-04-28 DIAGNOSIS — F17200 Nicotine dependence, unspecified, uncomplicated: Secondary | ICD-10-CM

## 2011-04-28 DIAGNOSIS — F331 Major depressive disorder, recurrent, moderate: Secondary | ICD-10-CM

## 2011-04-28 DIAGNOSIS — Z01419 Encounter for gynecological examination (general) (routine) without abnormal findings: Secondary | ICD-10-CM | POA: Insufficient documentation

## 2011-04-28 HISTORY — DX: Tobacco use: Z72.0

## 2011-04-28 LAB — LIPID PANEL
HDL: 65 mg/dL (ref >39)
LDL Cholesterol: 73 mg/dL (ref 0–99)
Total CHOL/HDL Ratio: 2.2 Ratio
Triglycerides: 33 mg/dL (ref <150)

## 2011-04-28 LAB — POCT WET PREP (WET MOUNT)

## 2011-04-28 LAB — POCT URINALYSIS DIPSTICK
Bilirubin, UA: NEGATIVE
Blood, UA: NEGATIVE
Glucose, UA: NEGATIVE
Ketones, UA: NEGATIVE
Nitrite, UA: NEGATIVE
Spec Grav, UA: 1.02

## 2011-04-28 MED ORDER — VARENICLINE TARTRATE 0.5 MG PO TABS: 0.5000 mg | ORAL_TABLET | Freq: Two times a day (BID) | ORAL | Status: AC

## 2011-04-28 MED ORDER — VARENICLINE TARTRATE 1 MG PO TABS: 1.0000 mg | ORAL_TABLET | Freq: Two times a day (BID) | ORAL | Status: AC

## 2011-04-28 NOTE — Assessment & Plan Note (Addendum)
Repeat PAP today since she had an ASCUS pap in 2010. Also checking wet prep since patient has history of recurrent BV, has some vaginal itching and is concerned about it.

## 2011-04-28 NOTE — Assessment & Plan Note (Addendum)
Will start chantix as detailed in AVS. Will check Creatinine for kidney function although kidney function was normal in 2009. Smoking cessation is all the more important that she is on birth control pill.

## 2011-04-28 NOTE — Patient Instructions (Addendum)
It was good seeing you today! I will send you a letter with your PAP results and will call you if anything shows up from the wet prep.   I sent in a prescription for chantix.  In the first 3 days, take 0.5mg  daily. From day 4-7, take 0.5mg  twice daily. From day 8 on, take 1mg  twice daily for 11 weeks.  Start taking the medicine 1 week before your quit date.

## 2011-04-28 NOTE — Progress Notes (Signed)
Patient ID: Janet Hughes, female   DOB: Jan 10, 1981, 31 y.o.   MRN: [id number] Patient ID: Janet Hughes    DOB: [birth year], 31 y.o.   MRN: [id number] --- Subjective:  Janet Hughes is a 30 y.o.female who presents for yearly physical exam.  Concerns include: - vaginal itching. Does have a history of recurrent BV and would like a wet prep. She denies any vaginal burning, abnormal discharge or odor. She denies any dysuria, polyuria or back pain.  - anxiety and stress at home with fiance who has been diagnosed with HTN and father in law with heart attack and stroke. She is also worried about her 30 year old having depression. She takes xanax up to 1/2 pill 3 times weekly. She is not interested in starting SSRI as of yet. Denies any SI or HI.  - smoking cessation: started smoking again 2 months ago. Had quit smoking for 4 months. Has tried chantix in the past which worked for her and she would like to try that again. She has quit multiple times in the past. Longest time she quit was 1 year when she was breastfeeding.   Objective: Filed Vitals:   04/28/11 0837  BP: 116/81  Pulse: 68  Temp: 98.8 F (37.1 C)    Physical Examination:   General appearance - alert, well appearing, and in no distress Mouth - mucous membranes moist, pharynx normal without lesions Neck - supple, no significant adenopathy Chest - clear to auscultation, no wheezes, rales or rhonchi, symmetric air entry Heart - normal rate, regular rhythm, normal S1, S2, no murmurs, rubs, clicks or gallops Abdomen - soft, nontender, nondistended, no masses or organomegaly, scar from C section.  Pelvic exam: normal external genitalia, vulva, vagina, cervix, uterus and adnexa, scant white discharge in vaginal vault.

## 2011-04-28 NOTE — Assessment & Plan Note (Addendum)
Continue xanax as needed for anxiety. Offered SSRI (celexa) again, but patient is still not interested and politely declined at this time. Reminded her that I could prescribe it at any time that she would like to try it. No SI or HI. Anxiety seems provoked by life events that are occuring.

## 2011-04-28 NOTE — Assessment & Plan Note (Signed)
Will check fasting lipid panel as it has not been checked.

## 2011-04-29 LAB — BASIC METABOLIC PANEL
BUN: 14 mg/dL (ref 6–23)
Calcium: 9.1 mg/dL (ref 8.4–10.5)
Creat: 0.81 mg/dL (ref 0.50–1.10)

## 2011-04-30 ENCOUNTER — Telehealth: Payer: Self-pay | Admitting: Family Medicine

## 2011-04-30 NOTE — Telephone Encounter (Signed)
Called pt. Informed of labs all normal. Printed out for pt to pick up. Lorenda Hatchet, Renato Battles

## 2011-04-30 NOTE — Telephone Encounter (Signed)
Patient is calling for results of blood work.

## 2011-05-18 ENCOUNTER — Telehealth: Payer: Self-pay | Admitting: Family Medicine

## 2011-05-18 ENCOUNTER — Encounter: Payer: Self-pay | Admitting: Family Medicine

## 2011-05-18 ENCOUNTER — Ambulatory Visit (INDEPENDENT_AMBULATORY_CARE_PROVIDER_SITE_OTHER): Payer: Medicaid Other | Admitting: Family Medicine

## 2011-05-18 VITALS — BP 118/74 | HR 64 | Temp 98.9°F | Ht 63.0 in | Wt 138.0 lb

## 2011-05-18 DIAGNOSIS — R21 Rash and other nonspecific skin eruption: Secondary | ICD-10-CM

## 2011-05-18 MED ORDER — LORATADINE 10 MG PO TABS: 10.0000 mg | ORAL_TABLET | Freq: Every day | ORAL | Status: DC

## 2011-05-18 NOTE — Progress Notes (Signed)
  Subjective:    Patient ID: Janet Hughes, female    DOB: 19-Apr-1980, 31 y.o.   MRN: [id number]  HPI Acute visit: rash Started yesterday. Pruritic. No pain.  She tried putting OTC steroid cream, which seems to help itchiness.   She denies using new soaps, shampoos, perfume. She denies being in woods/farms recently, exposure to new plants/animals (she does have a dog). She denies eating new foods. No one else in her family (including boyfriend/husband or daughters) are sick.  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   Review of Systems Complaining of diarrhea that started last night. No other family member with diarrhea. Denies blood in stool or fever. About 6 episodes today. Denies abdominal pain.     Objective:   Physical Exam Gen: NAD Skin: mildly erythematous rash on neck and anterior chest with occasional discrete 1 mm pustules scattered across chest and neck    Assessment & Plan:

## 2011-05-18 NOTE — Telephone Encounter (Signed)
Patient states just this AM she has developed a rash on clavical area   down onto anterior chest . The are is itching. . She started Chantix 3 weeks ago but actually stopped it 2 days ago  because she had headaches  and felt fatigued while on it.   Appointment scheduled for this afternoon. Also reports diarrhea x 2 today.

## 2011-05-18 NOTE — Assessment & Plan Note (Signed)
Started acutely yesterday.  Cause unclear.  May be associated with mild GI illness that started at the same time (she has diarrhea without abdominal pain).  Will monitor for now. Treat pruritus with anti-histamine. May continue to use steroid cream since it seems to provide relief as well.  Follow-up as needed.

## 2011-05-18 NOTE — Patient Instructions (Signed)
Try the anti-histamine. Cool compresses may also make you feel more comfortable.   Return to clinic or call if your rash worsens or does not improve after 1-2 weeks.

## 2011-05-18 NOTE — Telephone Encounter (Signed)
Rash on her neck and having diarrhea started last 2 days.  Is on Chantix and is not sure if she is having allergic reaction to this.  Wants to talk to nurse

## 2011-05-21 ENCOUNTER — Encounter: Payer: Self-pay | Admitting: Family Medicine

## 2011-07-03 ENCOUNTER — Other Ambulatory Visit: Payer: Self-pay | Admitting: Family Medicine

## 2011-07-27 ENCOUNTER — Ambulatory Visit (INDEPENDENT_AMBULATORY_CARE_PROVIDER_SITE_OTHER): Payer: Medicaid Other | Admitting: Family Medicine

## 2011-07-27 ENCOUNTER — Other Ambulatory Visit (HOSPITAL_COMMUNITY)
Admission: RE | Admit: 2011-07-27 | Discharge: 2011-07-27 | Disposition: A | Discharge status: Home / Self Care | Payer: Medicaid Other | Source: Ambulatory Visit | Attending: Family Medicine | Admitting: Family Medicine

## 2011-07-27 ENCOUNTER — Encounter: Payer: Self-pay | Admitting: Family Medicine

## 2011-07-27 VITALS — BP 115/79 | HR 72 | Temp 98.5°F | Ht 62.0 in | Wt 138.0 lb

## 2011-07-27 DIAGNOSIS — N898 Other specified noninflammatory disorders of vagina: Secondary | ICD-10-CM

## 2011-07-27 DIAGNOSIS — Z113 Encounter for screening for infections with a predominantly sexual mode of transmission: Secondary | ICD-10-CM | POA: Insufficient documentation

## 2011-07-27 DIAGNOSIS — N760 Acute vaginitis: Secondary | ICD-10-CM

## 2011-07-27 HISTORY — DX: Acute vaginitis: N76.0

## 2011-07-27 LAB — POCT WET PREP (WET MOUNT)

## 2011-07-27 MED ORDER — FLUCONAZOLE 150 MG PO TABS: 150.0000 mg | ORAL_TABLET | Freq: Once | ORAL | Status: AC

## 2011-07-27 NOTE — Assessment & Plan Note (Signed)
Thick discharge with itching. Wet prep is negative, though symptoms most likely candidal yeast infection, no red flags today. Will treat as such with fluconazole x1. Discussed behavior modifications. GC/CH sent for culture. Followup if symptoms not improved or worsening.

## 2011-07-27 NOTE — Patient Instructions (Signed)
Will call if tests are abnormal. Make sure to wear cotton underwear, you can eat extra yogurt or take probiotics to keep good bacteria.  Candidal Vulvovaginitis Candidal vulvovaginitis is an infection of the vagina and vulva. The vulva is the skin around the opening of the vagina. This may cause itching and discomfort in and around the vagina.  HOME CARE  Only take medicine as told by your doctor.   Do not have sex (intercourse) until the infection is healed or as told by your doctor.   Practice safe sex.   Tell your sex partner about your infection.   Do not douche or use tampons.   Wear cotton underwear. Do not wear tight pants or panty hose.   Eat yogurt. This may help treat and prevent yeast infections.  GET HELP RIGHT AWAY IF:   You have a fever.   Your problems get worse during treatment or do not get better in 3 days.   You have discomfort, irritation, or itching in your vagina or vulva area.   You have pain after sex.   You start to get belly (abdominal) pain.  MAKE SURE YOU:  Understand these instructions.   Will watch your condition.   Will get help right away if you are not doing well or get worse.  Document Released: 03/27/2008 Document Revised: 12/18/2010 Document Reviewed: 03/27/2008 [facility] Patient Information 2012 [place], [place].

## 2011-07-27 NOTE — Progress Notes (Signed)
  Subjective:    Patient ID: [name], female    DOB: Nov 26, 1980, 31 y.o.   MRN: [id number]  HPI VAGINAL DISCHARGE Onset: one week Description: thick white DC, itching, irritation Modifying factors: none  Symptoms Odor: no Itching: yes Vaginal burning: yes  Dysuria: mild Bleeding: no Pelvic pain: no Back pain: no Fever: no Genital sores: no Rash: no Dyspareunia: no GI Symptoms: no  Red Flags:  Missed period: no Recent antibiotics: no Possible STD exposure: no-engaged but yes to unprotected intercourse IUD: no Diabetes: no  Review of Systems See HPI otherwise negative.  reports that she has quit smoking. Her smoking use included Cigarettes. She smoked .5 packs per day. She has never used smokeless tobacco.     Objective:   Physical Exam  Vitals reviewed. Constitutional: She is oriented to person, place, and time. She appears well-developed and well-nourished. No distress.  Eyes: EOM are normal.  Genitourinary: Uterus normal. Vaginal discharge found.       Thick white discharge. No odor. No CMT. No mucosal lesions or other lesions.  Neurological: She is alert and oriented to person, place, and time.  Skin: No rash noted.  Psychiatric: She has a normal mood and affect.          Assessment & Plan:

## 2011-07-28 ENCOUNTER — Telehealth: Payer: Self-pay | Admitting: Family Medicine

## 2011-07-28 ENCOUNTER — Other Ambulatory Visit: Payer: Self-pay | Admitting: Family Medicine

## 2011-07-28 DIAGNOSIS — N760 Acute vaginitis: Secondary | ICD-10-CM

## 2011-07-28 MED ORDER — METRONIDAZOLE 500 MG PO TABS: 500.0000 mg | ORAL_TABLET | Freq: Two times a day (BID) | ORAL | Status: AC

## 2011-07-28 NOTE — Telephone Encounter (Signed)
Discussed with patient negative study for BV, and itching is typically caused by yeast. Diflucan takes few days to work. i recommended monistat topical for itch. She became upset and states she always has BV and only rx that works is flagyl. Discussed this not being medically necessary. Discussed side effects of uneccessary abx, side effects. I do not believe this will cause her harm, so will give 5 d treatment flagyl since she is fixated on this treatment.

## 2011-07-28 NOTE — Telephone Encounter (Signed)
Is still itching really bad and has taken the diflucan.  Wants to know if we can call in metronidazole to help with the itching. [facility]- [address]

## 2011-07-28 NOTE — Telephone Encounter (Signed)
Returned call to patient.  Was seen in office yesterday by Dr. Cristal Ford and received Diflucan for yeast infection.  Patient had vaginal discharge, but denies odor.  Requesting rx for metronidazole to help with itching.  Will route request to Dr. Cristal Ford and call patient back.  Gaylene Brooks, RN

## 2011-07-29 ENCOUNTER — Telehealth: Payer: Self-pay | Admitting: *Deleted

## 2011-07-29 NOTE — Telephone Encounter (Signed)
Called pt and LMV for her to return call to inform her that her test results were negative.Janet Hughes [place]

## 2011-07-29 NOTE — Telephone Encounter (Signed)
Message copied by Deno Etienne on Wed Jul 29, 2011  4:33 PM ------      Message from: Durwin Reges      Created: Tue Jul 28, 2011  5:35 PM       Normal test, no infection=negative GC/CH. Please inform patient.

## 2011-07-30 NOTE — Telephone Encounter (Signed)
Spoke with patient and informed her of below 

## 2011-08-17 ENCOUNTER — Other Ambulatory Visit: Payer: Self-pay | Admitting: Family Medicine

## 2011-08-19 ENCOUNTER — Other Ambulatory Visit: Payer: Self-pay | Admitting: Family Medicine

## 2011-08-19 NOTE — Telephone Encounter (Signed)
Xanax 0.5mg  qhs, 30 tabs, no refills. faxed to pharmacy

## 2011-08-20 NOTE — Telephone Encounter (Signed)
Faxed refill request to pharmacy.

## 2011-09-01 ENCOUNTER — Telehealth: Payer: Self-pay | Admitting: *Deleted

## 2011-09-01 MED ORDER — NORETHIN ACE-ETH ESTRAD-FE 1-20 MG-MCG(24) PO CHEW: 1.0000 | CHEWABLE_TABLET | Freq: Every day | ORAL | Status: DC

## 2011-09-01 NOTE — Telephone Encounter (Signed)
[facility] calling to advise Dr Gwenlyn Saran that Loestrin 24 FE has been discontinued by manufacturer.  It has been replaced by Minastrin 24 FE.  The only difference is that it is chewable.  Requesting order to change RX to Minastrin.  It is covered by Medicaid.  RX sent to [facility].    Ileana Ladd

## 2011-11-25 ENCOUNTER — Other Ambulatory Visit: Payer: Self-pay | Admitting: Family Medicine

## 2011-12-02 ENCOUNTER — Ambulatory Visit: Payer: Medicaid Other | Admitting: Family Medicine

## 2011-12-28 ENCOUNTER — Ambulatory Visit: Payer: Medicaid Other | Admitting: Family Medicine

## 2011-12-29 ENCOUNTER — Telehealth: Payer: Self-pay | Admitting: Family Medicine

## 2011-12-29 ENCOUNTER — Encounter: Payer: Self-pay | Admitting: Family Medicine

## 2011-12-29 ENCOUNTER — Ambulatory Visit (INDEPENDENT_AMBULATORY_CARE_PROVIDER_SITE_OTHER): Payer: Medicaid Other | Admitting: Family Medicine

## 2011-12-29 VITALS — BP 122/84 | HR 74 | Ht 63.0 in | Wt 135.0 lb

## 2011-12-29 DIAGNOSIS — R41840 Attention and concentration deficit: Secondary | ICD-10-CM

## 2011-12-29 LAB — CBC WITH DIFFERENTIAL/PLATELET
Basophils Relative: 0 % (ref 0–1)
Eosinophils Relative: 1 % (ref 0–5)
HCT: 39.8 % (ref 36.0–46.0)
Hemoglobin: 13.1 g/dL (ref 12.0–15.0)
MCH: 29.2 pg (ref 26.0–34.0)
MCHC: 32.9 g/dL (ref 30.0–36.0)
MCV: 88.8 fL (ref 78.0–100.0)
Monocytes Absolute: 0.4 10*3/uL (ref 0.1–1.0)
Monocytes Relative: 5 % (ref 3–12)
Neutro Abs: 5.3 10*3/uL (ref 1.7–7.7)

## 2011-12-29 NOTE — Telephone Encounter (Signed)
Called patient with list of providers that take medicaid and evaluate for ADHD. Will send list by mail

## 2011-12-29 NOTE — Progress Notes (Signed)
Patient ID: SUHEILY BIRKS    DOB: February 03, 1980, 31 y.o.   MRN: [id number] --- Subjective:  Miral is a 31 y.o.female who presents for evaluation for trouble with concentration. She states that she started back school this year and noticed herself struggling with it. She made a C in biology when she had taken the subject before. She reports falling asleep in class, thinking about other things and needing to record the lecture to then listen to it on her own time, listening to small pieces at a time. When she was a child, she did not do very well in school and ended up skipping a lot.  She has always been know to be distracted and forgetful. She has forgotten to pick up her kids from school before. She tends to forget things easily and is told that she doesn't listen. This has been going on for as long as she can remember.  As a child she was thought to be lazy. Recently her daughter who is 8yo was diagnosed with ADHD and patient's mother has said that patient acted like her own daughter when she was her age.  She was treated for depression in the past, but medicine did not help with the forgetfulness.    ROS: see HPI Past Medical History: reviewed and updated medications and allergies. Social History: Tobacco: former smoker  Objective: Filed Vitals:   12/29/11 1425  BP: 122/84  Pulse: 74    Physical Examination:   General appearance - alert, well appearing, and in no distress Mental status - no SI, no HI, normal affect, normal speech

## 2011-12-30 DIAGNOSIS — R41840 Attention and concentration deficit: Secondary | ICD-10-CM | POA: Insufficient documentation

## 2011-12-30 NOTE — Assessment & Plan Note (Addendum)
Possibly ADHD as an adult, that was not diagnosed as a child. Gave patient numbers to call of ofices in town who conduct initial psych eval as well as accept medicaid. [employer] reported that she would likely be eligible.   Also checking organic causes: CBC, TSH

## 2011-12-31 ENCOUNTER — Telehealth: Payer: Self-pay | Admitting: *Deleted

## 2011-12-31 NOTE — Telephone Encounter (Signed)
Message copied by Jennette Bill on Thu Dec 31, 2011  5:15 PM ------      Message from: Marena Chancy E      Created: Thu Dec 31, 2011 10:22 AM       Hi Molly Maduro,      Do you mind calling Ms. Pohlmann to let her know that her TSH and CBC were normal?      Thank you so much!      Judeth Cornfield

## 2011-12-31 NOTE — Telephone Encounter (Signed)
Called patient and read message to her.Busick, Rodena Medin

## 2012-02-04 ENCOUNTER — Encounter: Payer: Self-pay | Admitting: Family Medicine

## 2012-02-04 ENCOUNTER — Ambulatory Visit: Payer: Medicaid Other | Admitting: Family Medicine

## 2012-02-04 ENCOUNTER — Ambulatory Visit (INDEPENDENT_AMBULATORY_CARE_PROVIDER_SITE_OTHER): Payer: Medicaid Other | Admitting: Family Medicine

## 2012-02-04 VITALS — BP 143/91 | HR 88 | Ht 63.0 in | Wt 130.0 lb

## 2012-02-04 DIAGNOSIS — N760 Acute vaginitis: Secondary | ICD-10-CM

## 2012-02-04 DIAGNOSIS — N898 Other specified noninflammatory disorders of vagina: Secondary | ICD-10-CM

## 2012-02-04 LAB — POCT WET PREP (WET MOUNT): Clue Cells Wet Prep Whiff POC: NEGATIVE

## 2012-02-04 NOTE — Patient Instructions (Addendum)
It was good to meet you today. Your wet prep was negative.   I recommend calling in a few days if symptoms worsen and at that time we can prescribe something.  Otherwise, you are most likely having normal discharge right now. If discharge is clumpy like cottage cheese, you may have a yeast infection and can get medication over the counter.  This is also unlikely at this time.  Thanks and have a good day!

## 2012-02-04 NOTE — Assessment & Plan Note (Signed)
Currently no discomfort but reports thick white vaginal discharge similar to previous BV.  Wet prep negative with normal amounts of lactobacillus, no WBC seen, negative whiff, no yeast, no trich. - Reassurance that likely physiologic discharge - Recommended OTC monostat if developed thick cottage-cheese like discharge - Recommended that if developed symptoms of itching/discomfort/pain, return or call for prescription for metronidazole

## 2012-02-04 NOTE — Progress Notes (Signed)
Subjective:     Patient ID: Janet Hughes, female   DOB: [birth year], 32 y.o.   MRN: [id number]  CC - Vaginal discharge  HPI  Janet Hughes is a 32 y.o. female with h/o vaginitis that was likely yeast infection in July, BV, and ASCUS in 2010 followed by negative pap in 2012, presenting today with 2 days of thick white vaginal discharge.  Patient complains of non-odorous discharge similar to when she gets bacterial vaginosis, however she denies mucosal irritation, itching / discomfort, dyspareunia, abdominal pain, fevers, chills, rash, changes in vaginal mucosa, or dysuria.  She reports multiple episodes of BV in the past with similar discharge, and symptoms have cleared with metronidazole in the past.  No PMH, SH, or FH changes.  Pt is not taking any new antibiotics.  Takes birth control regularly.  Denies drugs/alcohol but smokes about 5 cigarettes daily.  Reports the same sexual partner x 3 years and no concern for STD.  Review of Systems Per HPI; otherwise negative.     Objective:   Physical Exam BP 143/91  Pulse 88  Ht 5\' 3"  (1.6 m)  Wt 130 lb (58.968 kg)  BMI 23.03 kg/m2 GEN: NAD GU: External vaginal mucosa WNL, no discharge seen externally, no odor, no tenderness upon swabbing    Assessment:     Janet Hughes is a 32 y.o. female with h/o vaginitis that was likely yeast infection in July, BV, and ASCUS in 2010 followed by negative pap in 2012, presenting today with 2 days of thick white vaginal discharge.    Plan:

## 2012-02-09 ENCOUNTER — Ambulatory Visit: Payer: Medicaid Other | Admitting: Family Medicine

## 2012-02-16 ENCOUNTER — Encounter: Payer: Self-pay | Admitting: Family Medicine

## 2012-02-16 ENCOUNTER — Ambulatory Visit (INDEPENDENT_AMBULATORY_CARE_PROVIDER_SITE_OTHER): Payer: Medicaid Other | Admitting: Family Medicine

## 2012-02-16 VITALS — BP 118/70 | HR 88 | Ht 63.0 in | Wt 129.8 lb

## 2012-02-16 DIAGNOSIS — F419 Anxiety disorder, unspecified: Secondary | ICD-10-CM

## 2012-02-16 DIAGNOSIS — F411 Generalized anxiety disorder: Secondary | ICD-10-CM

## 2012-02-16 MED ORDER — ALPRAZOLAM 0.5 MG PO TABS: 0.5000 mg | ORAL_TABLET | Freq: Every evening | ORAL | Status: DC | PRN

## 2012-02-16 NOTE — Patient Instructions (Signed)
  For the anxiety, we can always try an SSRI or something similar if that is something you think you might be interested in. The different options would be zoloft, effexor or cymbalta. They may affect you differently than the citalopram. Let me know in the future if you want to try any one of them.   I'll see you back in 4-5 months or sooner if needed.

## 2012-02-16 NOTE — Progress Notes (Signed)
Patient ID: Janet Hughes    DOB: 1980/11/10, 32 y.o.   MRN: [id number] --- Subjective:  Janet Hughes is a 32 y.o.female who presents for follow up on anxiety. Takes 3-4 xanax per month. takes it for sleep when she is anxious about issues with her 31 year old daughter. Anxiety is mostly linked with her relationship with her teenage daughter. She reports trouble sleeping at times due to too many thoughts running through head. USes xanax 0.25mg  three to four times per month which helps with sleep.  When asked about things she does to help her relax and cope with anxiety, she reports that she retreats to her own space or deep cleans the house.  Tried paxil which stripped her of emotions. Tried celexa which decreased her libido which was a problem for her.    ROS: see HPI Past Medical History: reviewed and updated medications and allergies. Social History: Tobacco: current smoker  Objective: Filed Vitals:   02/16/12 1512  BP: 118/70  Pulse: 88    Physical Examination:   General appearance - alert, well appearing, and in no distress Chest - clear to auscultation, no wheezes, rales or rhonchi, symmetric air entry Heart - normal rate, regular rhythm, normal S1, S2, no murmurs, rubs, clicks or gallops

## 2012-02-17 DIAGNOSIS — F419 Anxiety disorder, unspecified: Secondary | ICD-10-CM | POA: Insufficient documentation

## 2012-02-17 NOTE — Assessment & Plan Note (Signed)
Discussed coping mechanisms like exercise or other recreational activity that would allow patient to cope with anxiety in a different way. She uses deep house cleaning and work as way to take her mind off of things at home. Discussed SSRI/SNRI for anxiety but patient is still concerned about loss of libido side effect. Will readress subject after patient has had ADHD eval at the end of February. Refill xanax 0.5mg  qhs/prn 30 tabs.

## 2012-03-31 ENCOUNTER — Ambulatory Visit (INDEPENDENT_AMBULATORY_CARE_PROVIDER_SITE_OTHER): Payer: Medicaid Other | Admitting: Family Medicine

## 2012-03-31 ENCOUNTER — Encounter: Payer: Self-pay | Admitting: Family Medicine

## 2012-03-31 VITALS — BP 126/75 | HR 88 | Ht 62.5 in | Wt 129.0 lb

## 2012-03-31 DIAGNOSIS — Z01818 Encounter for other preprocedural examination: Secondary | ICD-10-CM

## 2012-03-31 LAB — CBC WITH DIFFERENTIAL/PLATELET
Basophils Absolute: 0 10*3/uL (ref 0.0–0.1)
Eosinophils Absolute: 0.1 10*3/uL (ref 0.0–0.7)
Eosinophils Relative: 2 % (ref 0–5)
Lymphocytes Relative: 50 % — ABNORMAL HIGH (ref 12–46)
MCH: 28.2 pg (ref 26.0–34.0)
MCV: 85.6 fL (ref 78.0–100.0)
Neutrophils Relative %: 40 % — ABNORMAL LOW (ref 43–77)
Platelets: 273 10*3/uL (ref 150–400)
RDW: 14.3 % (ref 11.5–15.5)
WBC: 3.2 10*3/uL — ABNORMAL LOW (ref 4.0–10.5)

## 2012-03-31 LAB — BASIC METABOLIC PANEL
Calcium: 9.6 mg/dL (ref 8.4–10.5)
Creat: 0.94 mg/dL (ref 0.50–1.10)

## 2012-03-31 NOTE — Assessment & Plan Note (Signed)
preop eval for breast augmentation surgery at the [facility] center with Dr. Benna Dunks.  Patient with no know cardio-pulmonary history. BP and HR normal.  Will obtain CBC and BMP for baseline values. Will also obtain chest xray. Patient currently on OCP's. With increase risk of DVT with major surgery, it will be important for patient to receive prophylactic anticoagulant therapy. Will recommend this in letter to Dr. Benna Dunks.

## 2012-03-31 NOTE — Patient Instructions (Addendum)
I will send a letter for clearance to the [place] center. I will send you a copy of the labwork.

## 2012-03-31 NOTE — Progress Notes (Signed)
Patient ID: Janet Hughes    DOB: [birth year], 32 y.o.   MRN: [id number] --- Subjective:  Janet Hughes is a 32 y.o.female who presents for preoperative evaluation prior to breast augmentation surgery on April 15th.  Patient denies any shortness of breath, no chest pain. She is able to climb up to 3 flights of stairs without getting short winded. No cardio-pulmonary history.    ROS: see HPI Past Medical History: reviewed and updated medications and allergies. She takes minastrin birth control pills.  Social History: Tobacco: quit February 14th 2014.   Objective: Filed Vitals:   03/31/12 0836  BP: 126/75  Pulse: 88    Physical Examination:   General appearance - alert, well appearing, and in no distress Mouth - mucous membranes moist, pharynx normal without lesions Neck - supple, no significant adenopathy Chest - clear to auscultation, no wheezes, rales or rhonchi, symmetric air entry Heart - normal rate, regular rhythm, normal S1, S2, no murmurs, rubs, clicks or gallops Abdomen - soft, non tender, no masses Extremities - peripheral pulses normal, no pedal edema

## 2012-04-04 ENCOUNTER — Encounter: Payer: Self-pay | Admitting: Family Medicine

## 2012-04-05 ENCOUNTER — Encounter: Payer: Self-pay | Admitting: Family Medicine

## 2012-04-05 ENCOUNTER — Ambulatory Visit (INDEPENDENT_AMBULATORY_CARE_PROVIDER_SITE_OTHER): Payer: Medicaid Other | Admitting: Family Medicine

## 2012-04-05 VITALS — BP 125/75 | HR 65 | Temp 98.5°F | Ht 62.5 in | Wt 132.0 lb

## 2012-04-05 DIAGNOSIS — R300 Dysuria: Secondary | ICD-10-CM | POA: Insufficient documentation

## 2012-04-05 LAB — POCT URINALYSIS DIPSTICK
Glucose, UA: NEGATIVE
Leukocytes, UA: NEGATIVE
Nitrite, UA: NEGATIVE
Urobilinogen, UA: 0.2

## 2012-04-05 LAB — POCT UA - MICROSCOPIC ONLY

## 2012-04-05 NOTE — Progress Notes (Signed)
Janet Hughes is a 32 y.o. female who presents to [facility] today for UTI   Sunday started w/ bladder irritation after urination. Increasing frequency. No loss of bladder control. Denies fever, n/v/d/c, abdominal pain. Has not taken any medications. No new sexual partners. Deneis any vaginal irritation or discharge.    The following portions of the patient's history were reviewed and updated as appropriate: allergies, current medications, past medical history, family and social history, and problem list.  Patient is a former smoker (quit in February)  Past Medical History  Diagnosis Date  . Post partum depression   . Abortion, therapeutic 12/01  . Chlamydia infection     repeated infections    ROS as above otherwise neg.    Medications reviewed. Current Outpatient Prescriptions  Medication Sig Dispense Refill  . ALPRAZolam (XANAX) 0.5 MG tablet Take 1 tablet (0.5 mg total) by mouth at bedtime as needed for sleep.  30 tablet  0  . Norethin Ace-Eth Estrad-FE (MINASTRIN 24 FE) 1-20 MG-MCG(24) CHEW Chew 1 tablet by mouth daily.  28 tablet  11  . polyethylene glycol powder (GLYCOLAX/MIRALAX) powder USE 17 GRAMS AS DIRECTED EVERY DAY  527 g  0   No current facility-administered medications for this visit.    Exam:  BP 125/75  Pulse 65  Temp(Src) 98.5 F (36.9 C) (Oral)  Ht 5' 2.5" (1.588 m)  Wt 132 lb (59.875 kg)  BMI 23.74 kg/m2  LMP 03/11/2012 Gen: Well NAD HEENT: EOMI,  MMM Lungs: CTABL Nl WOB Heart: RRR no MRG Abd: NABS, NT, ND Exts: Non edematous BL  LE, warm and well perfused.   No results found for this or any previous visit (from the past 72 hour(s)).

## 2012-04-05 NOTE — Assessment & Plan Note (Signed)
No evidence of UTI on UA today.  Possible early UTI vs urethritis vs psychosomatic (pt endorses beign nervous/anxious about having a UTI) Pt to increase fluid intake Precautions given If symptoms worsen pt to call and come in for repeat UA

## 2012-04-05 NOTE — Patient Instructions (Addendum)
You are doing well.  There are no signs of a UTI on your lab work Please increase your fluid intake You may also try 600mg  of ibuprofen or advil every 6 hours to help with inflammation and pain. If your symptoms get worse  please come back for another urine test. Have a great day.

## 2012-04-07 ENCOUNTER — Encounter: Payer: Self-pay | Admitting: Family Medicine

## 2012-04-07 ENCOUNTER — Ambulatory Visit (INDEPENDENT_AMBULATORY_CARE_PROVIDER_SITE_OTHER): Payer: Medicaid Other | Admitting: Family Medicine

## 2012-04-07 ENCOUNTER — Ambulatory Visit (HOSPITAL_COMMUNITY)
Admission: RE | Admit: 2012-04-07 | Discharge: 2012-04-07 | Disposition: A | Discharge status: Home / Self Care | Payer: Medicaid Other | Source: Ambulatory Visit | Attending: Family Medicine | Admitting: Family Medicine

## 2012-04-07 VITALS — BP 130/82 | HR 82 | Temp 99.6°F | Ht 62.5 in | Wt 132.0 lb

## 2012-04-07 DIAGNOSIS — Z87891 Personal history of nicotine dependence: Secondary | ICD-10-CM | POA: Insufficient documentation

## 2012-04-07 DIAGNOSIS — Z01818 Encounter for other preprocedural examination: Secondary | ICD-10-CM

## 2012-04-07 DIAGNOSIS — Z01811 Encounter for preprocedural respiratory examination: Secondary | ICD-10-CM | POA: Insufficient documentation

## 2012-04-07 DIAGNOSIS — R300 Dysuria: Secondary | ICD-10-CM

## 2012-04-07 DIAGNOSIS — N760 Acute vaginitis: Secondary | ICD-10-CM

## 2012-04-07 LAB — POCT UA - MICROSCOPIC ONLY

## 2012-04-07 LAB — POCT WET PREP (WET MOUNT): Clue Cells Wet Prep Whiff POC: NEGATIVE

## 2012-04-07 LAB — POCT URINALYSIS DIPSTICK
Glucose, UA: NEGATIVE
Ketones, UA: NEGATIVE
Spec Grav, UA: >=1.03
Urobilinogen, UA: 0.2

## 2012-04-07 MED ORDER — FLUCONAZOLE 150 MG PO TABS: 150.0000 mg | ORAL_TABLET | Freq: Once | ORAL | Status: DC

## 2012-04-07 NOTE — Addendum Note (Signed)
Addended by: Jimmy Footman K on: 04/07/2012 11:49 AM   Modules accepted: Orders

## 2012-04-07 NOTE — Progress Notes (Signed)
  Subjective:    Patient ID: Janet Hughes, female    DOB: [birth year], 32 y.o.   MRN: [id number]  Dysuria   Vaginal Discharge The patient's primary symptoms include a vaginal discharge. Associated symptoms include dysuria.    1. Dysuria, vaginal discharge. Began with dysuria 3 days ago, was evaluated in clinic and found to have some hematuria but no sign of infection. Since that time she is having more thick white discharge and some itching. 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 She is concerned about persistent symptoms.   Denies any abdominal pain, nausea, emesis, fever, back pain, pelvic pain, rash, chills, fatigue, odor.  She reports frequent BV infections.  No new sexual partners. LMP 03/12/12.   Review of Systems  Genitourinary: Positive for dysuria and vaginal discharge.   See HPI otherwise negative.  reports that she quit smoking about 5 weeks ago. Her smoking use included Cigarettes. She smoked 0.30 packs per day. She has never used smokeless tobacco.     Objective:   Physical Exam  Vitals reviewed. Constitutional: She is oriented to person, place, and time. She appears well-developed and well-nourished. No distress.  HENT:  Mouth/Throat: Oropharynx is clear and moist.  Pulmonary/Chest: Effort normal.  Abdominal: Soft. Bowel sounds are normal. She exhibits no distension. There is no tenderness. There is no rebound and no guarding.  No CVA tenderness.   Genitourinary:  Moderate amt thick white discharge. No cervical motion tenderness.  No suprapubic TTP.  Neurological: She is alert and oriented to person, place, and time.  Skin: No rash noted. She is not diaphoretic.          Assessment & Plan:

## 2012-04-07 NOTE — Assessment & Plan Note (Signed)
Wet prep confirms yeast, most likely cause for symptoms and hematuria on UA without direct evidence of UTI. She refuses the recommended GC/CH testing today, since she is monogamous. Treat with diflucan. Send urine culture to definitively rule out infection, though discussed with patient this is very low yield after she had a rocephin injection at her work place. She will f/u if fails to improve.

## 2012-04-07 NOTE — Patient Instructions (Addendum)
You have yeast. Will send diflucan. May repeat in 3-4 days if desired. Sent urine culture to rule out UTI, most likely blood is from yeast irritation. Follow up in 7-10 days if symptoms fail to improve.

## 2012-04-08 ENCOUNTER — Ambulatory Visit: Payer: Medicaid Other | Admitting: Family Medicine

## 2012-04-08 LAB — URINE CULTURE: Colony Count: NO GROWTH

## 2012-04-11 ENCOUNTER — Telehealth: Payer: Self-pay | Admitting: Family Medicine

## 2012-04-11 ENCOUNTER — Telehealth: Payer: Self-pay | Admitting: *Deleted

## 2012-04-11 NOTE — Telephone Encounter (Signed)
Is asking to speak with supervisor about her visits last week.  She knows that she had blood in her urine and is still symptomatic and no one wanted to call in any meds for her.  She states that both doctors were unprofessional and didn't try to help her.  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  Wants to report this

## 2012-04-11 NOTE — Telephone Encounter (Signed)
Related message,pt states she's still having symptoms,burning,dysuria and hematuria.Sound very frustrated please advise. Alaa Mullally, Virgel Bouquet

## 2012-04-11 NOTE — Telephone Encounter (Signed)
Pt request treatment for uti,she refuses finding  Showing no  UTI.she states notices blood in urine and  still having symptoms.please advise. Shawnita Krizek, [place]

## 2012-04-11 NOTE — Telephone Encounter (Signed)
She can repeat the diflucan (I think I sent a refill pill) or try monistat OTC.

## 2012-04-11 NOTE — Telephone Encounter (Signed)
Patient states she was seen here last Tuesday c/o (UTI symptoms--dysuria).  Was informed that U/A was "normal."  Started having itching and was seen on last Thursday.  Discussed with Dr. Cristal Ford at office visit that test was + for yeast and blood in urine.  Patient states she is still "symptomatic".  Checked her urine at work and still had blood in urine.  Continues to have "bladder pain and itching."  Took Diflucan and not having vaginal itching.  Reports urethral itching and dysuria when urinating.  Urine is "cloudy ", but not odor.  Will discuss with Dr. Gwenlyn Saran or Dr. Cristal Ford for advice and call patient back tomorrow.  Gaylene Brooks, RN

## 2012-04-11 NOTE — Telephone Encounter (Signed)
Message copied by Tanna Savoy on Mon Apr 11, 2012  9:27 AM ------      Message from: Durwin Reges      Created: Mon Apr 11, 2012  8:24 AM       Please inform pt there is no urine culture growth to indicate a UTI.  ------

## 2012-04-12 ENCOUNTER — Other Ambulatory Visit: Payer: Self-pay | Admitting: Family Medicine

## 2012-04-12 ENCOUNTER — Telehealth: Payer: Self-pay | Admitting: *Deleted

## 2012-04-12 MED ORDER — NITROFURANTOIN MONOHYD MACRO 100 MG PO CAPS: 100.0000 mg | ORAL_CAPSULE | Freq: Two times a day (BID) | ORAL | Status: DC

## 2012-04-12 NOTE — Telephone Encounter (Signed)
Pt aware to pick microbid if still having symptoms. Breaker Springer, Virgel Bouquet

## 2012-04-12 NOTE — Telephone Encounter (Signed)
Related message per Dr Cristal Ford that patient could pick up her Macrobid, if still having symptom.Patient voiced understanding. Lavera Vandermeer, Virgel Bouquet

## 2012-04-12 NOTE — Telephone Encounter (Signed)
Spoke with Dr. Cristal Ford earlier and patient was given Rx for Macrobid.  See phone note for 04/12/12.  Gaylene Brooks, RN

## 2012-04-12 NOTE — Telephone Encounter (Signed)
I will send in a macrobid rx for patient to complete, though her urine culture was negative and doesn't seem to have a UTI. Please tell her to follow up in clinic if still having symptoms.

## 2012-04-26 HISTORY — PX: BREAST ENHANCEMENT SURGERY: SHX7

## 2012-05-06 ENCOUNTER — Encounter: Payer: Medicaid Other | Admitting: Family Medicine

## 2012-05-09 ENCOUNTER — Encounter: Payer: Medicaid Other | Admitting: Family Medicine

## 2012-05-20 ENCOUNTER — Ambulatory Visit (INDEPENDENT_AMBULATORY_CARE_PROVIDER_SITE_OTHER): Payer: Medicaid Other | Admitting: Family Medicine

## 2012-05-20 VITALS — BP 129/76 | HR 85 | Ht 62.0 in | Wt 128.0 lb

## 2012-05-20 DIAGNOSIS — N898 Other specified noninflammatory disorders of vagina: Secondary | ICD-10-CM

## 2012-05-20 DIAGNOSIS — N760 Acute vaginitis: Secondary | ICD-10-CM

## 2012-05-20 MED ORDER — FLUCONAZOLE 150 MG PO TABS: 150.0000 mg | ORAL_TABLET | Freq: Once | ORAL | Status: DC

## 2012-05-20 NOTE — Assessment & Plan Note (Signed)
Treat as yeast based on my personal wet prep. Did not see any BV, but unsure if yeast obscured clue cells. To call if no resolution with Diflucan

## 2012-05-20 NOTE — Progress Notes (Signed)
  Subjective    32 y.o. female complains of white, copious and curd-like vaginal discharge for 5 day(s). Denies abnormal vaginal bleeding, significant pelvic pain or fever. No UTI symptoms. Sexually active, does not use condoms, no change in partner.  Denies history of known exposure to STD or symptoms in partner.  Does not want to be checked for these.    No LMP recorded.  No history of STD's.  No abdominal pain, nausea, or vomiting.  No fevers or chills.     Review of Systems  See HPI above for review of systems.    Objective:   Physical Exam  BP 129/76  Pulse 85  Ht 5\' 2"  (1.575 m)  Wt 128 lb (58.06 kg)  BMI 23.41 kg/m2 Gen:  She appears well, afebrile.  Abdomen: benign, soft, nontender, no masses.  GYN:  External genitalia within normal limits.  Vaginal mucosa pink, moist, normal rugae.  Nonfriable cervix without lesions, thick white discharge, no bleeding noted on speculum exam.  Bimanual exam revealed normal, nongravid uterus.  No cervical motion tenderness. No adnexal masses bilaterally.

## 2012-05-20 NOTE — Patient Instructions (Signed)
Take the Diflucan once today. Repeat again in 3 days.  Call me if this does not clear things up.

## 2012-05-31 ENCOUNTER — Ambulatory Visit (INDEPENDENT_AMBULATORY_CARE_PROVIDER_SITE_OTHER): Payer: Medicaid Other | Admitting: *Deleted

## 2012-05-31 ENCOUNTER — Ambulatory Visit (INDEPENDENT_AMBULATORY_CARE_PROVIDER_SITE_OTHER): Payer: Medicaid Other | Admitting: Family Medicine

## 2012-05-31 ENCOUNTER — Other Ambulatory Visit (HOSPITAL_COMMUNITY)
Admission: RE | Admit: 2012-05-31 | Discharge: 2012-05-31 | Disposition: A | Discharge status: Home / Self Care | Payer: Medicaid Other | Source: Ambulatory Visit | Attending: Family Medicine | Admitting: Family Medicine

## 2012-05-31 ENCOUNTER — Encounter: Payer: Self-pay | Admitting: Family Medicine

## 2012-05-31 VITALS — BP 122/75 | HR 86 | Temp 98.1°F | Ht 62.0 in | Wt 129.0 lb

## 2012-05-31 DIAGNOSIS — Z1151 Encounter for screening for human papillomavirus (HPV): Secondary | ICD-10-CM | POA: Insufficient documentation

## 2012-05-31 DIAGNOSIS — Z01419 Encounter for gynecological examination (general) (routine) without abnormal findings: Secondary | ICD-10-CM | POA: Insufficient documentation

## 2012-05-31 DIAGNOSIS — Z0000 Encounter for general adult medical examination without abnormal findings: Secondary | ICD-10-CM

## 2012-05-31 DIAGNOSIS — Z020 Encounter for examination for admission to educational institution: Secondary | ICD-10-CM | POA: Insufficient documentation

## 2012-05-31 DIAGNOSIS — Z0289 Encounter for other administrative examinations: Secondary | ICD-10-CM

## 2012-05-31 DIAGNOSIS — Z124 Encounter for screening for malignant neoplasm of cervix: Secondary | ICD-10-CM

## 2012-05-31 DIAGNOSIS — R87610 Atypical squamous cells of undetermined significance on cytologic smear of cervix (ASC-US): Secondary | ICD-10-CM

## 2012-05-31 NOTE — Patient Instructions (Signed)
Thank you for coming in, today! I'm sorry that your shot records are not in our system.  We do have records of three vaccinations, but not the older ones.  Call your school to see if you can get a copy of those records.  When you get them, bring them in for a nurse visit, and we will enter them in our system.  Bring in your school form that day, as well.  We will place a PPD skin test for you, today. Come back in 48-72 hours to have it read.  We will check a titer for Varicella today. You probably did not get that vaccine. We will send you a letter with your Pap smear results. Please feel free to call with any questions or concerns at any time, at [phone]. --Dr. Casper Harrison

## 2012-05-31 NOTE — Progress Notes (Signed)
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 Records unable to be found but has been [employer] employee for many years. Titers run for Hep B, MMR and varicella. Pt told to come back in one week for form completion. NO further concerns.Wyatt Haste, RN-BSN

## 2012-06-01 LAB — RUBEOLA ANTIBODY IGG: Rubeola IgG: 273 AU/mL — ABNORMAL HIGH (ref <25.00)

## 2012-06-01 LAB — MUMPS ANTIBODY, IGG: Mumps IgG: >300 AU/mL — ABNORMAL HIGH (ref <9.00)

## 2012-06-02 ENCOUNTER — Encounter: Payer: Self-pay | Admitting: *Deleted

## 2012-06-02 ENCOUNTER — Encounter: Payer: Self-pay | Admitting: Family Medicine

## 2012-06-02 ENCOUNTER — Ambulatory Visit (INDEPENDENT_AMBULATORY_CARE_PROVIDER_SITE_OTHER): Payer: Medicaid Other | Admitting: *Deleted

## 2012-06-02 ENCOUNTER — Telehealth: Payer: Self-pay | Admitting: *Deleted

## 2012-06-02 DIAGNOSIS — Z111 Encounter for screening for respiratory tuberculosis: Secondary | ICD-10-CM

## 2012-06-02 DIAGNOSIS — Z09 Encounter for follow-up examination after completed treatment for conditions other than malignant neoplasm: Secondary | ICD-10-CM

## 2012-06-02 LAB — TB SKIN TEST
Induration: 0 mm
TB Skin Test: NEGATIVE

## 2012-06-02 NOTE — Telephone Encounter (Signed)
Pt called and informed that she has immunity on all vaccinations and papsmear was negative - will come in at 10 on tues for form completion. Wyatt Haste, RN-BSN

## 2012-06-02 NOTE — Assessment & Plan Note (Signed)
Normal Pap 05/2012, no high-risk HPV. Next Pap by current guidelines due in 2019.

## 2012-06-02 NOTE — Assessment & Plan Note (Signed)
Pap smear completed today. Normal with no high-risk HPV detected --> by current guidelines, pt will be due for next pap in 5 years. Pt currently on birth control. Counseled briefly on safe sex practices to prevent STI's.

## 2012-06-02 NOTE — Progress Notes (Signed)
  Subjective:    Patient ID: Janet Hughes, female    DOB: 1980/06/11, 32 y.o.   MRN: [id number]  HPI: Pt presents to clinic for yearly visit and Pap smear. Generally feels well without complaints. To be starting school for respiratory therapy. Denies frank depression or anhedonia; uses Xanax occasionally for anxiety (Rx by Dr. Gwenlyn Saran, declines SSRI therapy).   Pt requests shot record for school admission form; however, pt's shot record not available in our system (see A&P/problem list notes).  In addition to the above documentation, pt's PMH, surgical history, FH, and SH all reviewed and updated where appropriate in the EMR. -No changes to PMH, FH, or surgical history other than bilateral breast augmentation 04/2012. -SH reviewed. Current e-cigarette user..  Review of Systems: As above. Otherwise full 12-system ROS reviewed and all negative.     Objective:   Physical Exam BP 122/75  Pulse 86  Temp(Src) 98.1 F (36.7 C)  Ht 5\' 2"  (1.575 m)  Wt 129 lb (58.514 kg)  BMI 23.59 kg/m2 Gen: well-appearing adult female in NAD HEENT: [place]/AT, sclerae/conjunctivae clear, no lid lag, EOMI, PERRLA   MMM, posterior oropharynx clear, no cervical lymphadenopathy  neck supple with full ROM, no masses appreciated; thyroid not enlarged  Cardio: RRR, no murmur appreciated; distal pulses intact/symmetric Pulm: CTAB, no wheezes, normal WOB  Abd: soft, nondistended, BS+, no HSM GU: normal speculum exam, no CMT/adnexal masses/uterine tenderness on bimanual exam Ext: warm/well-perfused, no cyanosis/clubbing/edema MSK: strength 5/5 in all four extremities, no frank joint deformity/effusion;   normal ROM to all four extremities with no point muscle/bony tenderness in spine Neuro/Psych: alert/oriented, sensation grossly intact; normal gait/balance  mood euthymic with congruent affect     Assessment & Plan:

## 2012-06-02 NOTE — Progress Notes (Signed)
PPD Reading Note PPD read and results entered in EpicCare. Result: 0 mm induration. Interpretation: negative Tecia Oris Calmes, RN-BSN  

## 2012-06-02 NOTE — Assessment & Plan Note (Addendum)
Grossly normal physical exam. Pt appears in good physical and emotional health, today. School form completed with the exception of shot record; pt's shot record not available in our system or in the [place] database. -TB test performed this visit, negative within 48-72 hours. -Titers drawn this visit for Varicella, MMR, and Hep B (all suggest immunity/prior vaccination).  -Pt has some shot records available from her previous school (as they were required for admission at that time).  -Pt will return in about 1 week with her personal copies of shot records obtained from school, for nurse visit to complete form with titer results and enter her shot records into our system, as relevant.

## 2012-06-07 ENCOUNTER — Ambulatory Visit (INDEPENDENT_AMBULATORY_CARE_PROVIDER_SITE_OTHER): Payer: Medicaid Other | Admitting: *Deleted

## 2012-06-07 DIAGNOSIS — Z0000 Encounter for general adult medical examination without abnormal findings: Secondary | ICD-10-CM

## 2012-06-07 NOTE — Progress Notes (Signed)
Forms completed and signed.titer results attached.Wyatt Haste, RN-BSN

## 2012-07-19 ENCOUNTER — Ambulatory Visit (INDEPENDENT_AMBULATORY_CARE_PROVIDER_SITE_OTHER): Payer: Medicaid Other | Admitting: Family Medicine

## 2012-07-19 ENCOUNTER — Encounter: Payer: Self-pay | Admitting: Family Medicine

## 2012-07-19 VITALS — BP 123/73 | HR 88 | Temp 98.7°F | Resp 17 | Ht 62.0 in | Wt 128.4 lb

## 2012-07-19 DIAGNOSIS — B379 Candidiasis, unspecified: Secondary | ICD-10-CM

## 2012-07-19 HISTORY — DX: Candidiasis, unspecified: B37.9

## 2012-07-19 LAB — POCT URINALYSIS DIPSTICK
Bilirubin, UA: NEGATIVE
Ketones, UA: NEGATIVE
Leukocytes, UA: NEGATIVE
Protein, UA: NEGATIVE
Spec Grav, UA: >=1.03
pH, UA: 6

## 2012-07-19 MED ORDER — FLUCONAZOLE 150 MG PO TABS: 150.0000 mg | ORAL_TABLET | Freq: Once | ORAL | Status: DC

## 2012-07-19 NOTE — Progress Notes (Signed)
  Subjective:    Patient ID: Janet Hughes, female    DOB: 29-Nov-1980, 32 y.o.   MRN: [id number]  HPI: Pt presents to clinic for SDA for vaginal discharge. Pt believes she has a yeast infection. She has had these before. She has had thick, white, "cottage cheese"-like vaginal discharge for 3 days. Some itching, burning only when she wipes. No dysuria, pain with urination, increased frequency/urge/dribbling. No fever, chills, back or abdominal pain. Pt states she is spotting; her cycle ended two weeks ago but she missed an OCP recently. Otherwise feels well.  Review of Systems: As above.     Objective:   Physical Exam BP 123/73  Pulse 88  Temp(Src) 98.7 F (37.1 C)  Resp 17  Ht 5\' 2"  (1.575 m)  Wt 128 lb 6.4 oz (58.242 kg)  BMI 23.48 kg/m2  SpO2 98% Gen: well-appearing young adult female in NAD Cardio: RRR, no murmur Pulm: CTAB, no wheeze Abd: soft, nontender, BS+ GU: declines speculum exam (states "I've had these before and it's not that bad, and I'd rather not have a speculum if I don't have to") MSK: no CVA tenderness, no LE swelling, no pain with internal/external hip rotation     Assessment & Plan:

## 2012-07-19 NOTE — Patient Instructions (Signed)
Thank you for coming in, today! I think you most likely have a yeast infection. We did not do a speculum exam today per your preference. I will prescribe a single dose of Diflucan that you can take today. If you are still having symptoms in 1 week, fill the second pill of Diflucan and take it. If you have more symptoms after that, come back in for another appointment. If you have any fever, nausea/vomiting, back pain/abdominal pain, or worse pain with urination, increase urge/frequency, or dribbling, make an appointment to come back sooner rather than later. Otherwise follow up with Dr. Gwenlyn Saran as needed. Please feel free to call with any questions or concerns at any time, at [phone]. --Dr. Casper Harrison

## 2012-07-19 NOTE — Assessment & Plan Note (Signed)
A: Very likely simple yeast infection based on history. Pt declines speculum exam. UA notable only for moderate Hb and concentration, but pt states she is spotting and has not had much to drink today and yesterday. No systemic symptoms to suggest serious infection such as complicated UTI or pyelo.  P: Diflucan x1 today. Rx given printed for another Diflucan pill, to fill within one week if no improvement. Strict return precautions reviewed including worsening symptoms, development of dysuria, fever/chills, abdominal pain, etc. Otherwise f/u PRN.

## 2012-07-19 NOTE — Progress Notes (Signed)
Patient complain of having vaginal itching with some discharge Stated she gets yeast infections all the time Some spotting as well

## 2012-07-29 ENCOUNTER — Other Ambulatory Visit: Payer: Self-pay | Admitting: Family Medicine

## 2012-08-30 ENCOUNTER — Other Ambulatory Visit: Payer: Self-pay | Admitting: Family Medicine

## 2012-09-13 ENCOUNTER — Encounter: Payer: Self-pay | Admitting: *Deleted

## 2012-09-22 ENCOUNTER — Other Ambulatory Visit: Payer: Self-pay | Admitting: Family Medicine

## 2012-09-27 ENCOUNTER — Telehealth: Payer: Self-pay | Admitting: Family Medicine

## 2012-09-27 ENCOUNTER — Telehealth: Payer: Self-pay

## 2012-09-27 MED ORDER — ALPRAZOLAM 0.5 MG PO TABS: 0.5000 mg | ORAL_TABLET | Freq: Every evening | ORAL | Status: DC | PRN

## 2012-09-27 NOTE — Telephone Encounter (Signed)
Called pt and advised to schedule OV with PCP to discuss Xanax refill. Pt agreed.  Fwd. To PCP for info. Lorenda Hatchet, Renato Battles

## 2012-09-27 NOTE — Telephone Encounter (Signed)
Patient only has 3 Xanax pills left. Dr. Gwenlyn Saran has no avail appts until the middle of October. Patient made appt on 10/13. Will needs medicine before then.

## 2012-09-27 NOTE — Telephone Encounter (Signed)
Wrote for xanax 0.5mg  30 tablets, no refills. Rx ready at front desk for pick up until her follow up appointment.   Marena Chancy, PGY-3 Family Medicine Resident

## 2012-09-27 NOTE — Telephone Encounter (Signed)
Will fwd to Dr.Losq for review. .Janet Hughes  

## 2012-09-27 NOTE — Telephone Encounter (Signed)
Pt called because she is unsure if she needed to be seen to get a refill on Xanax. [facility] will be faxing [place] a request and if we can not do a refill be call her so that she can set up an appointment. JW

## 2012-09-28 ENCOUNTER — Telehealth: Payer: Self-pay | Admitting: *Deleted

## 2012-09-28 NOTE — Telephone Encounter (Signed)
Called pt. LMVM to call back. See previous message. 'Rx is up front for pick up'.  Thank you. Lorenda Hatchet, Renato Battles

## 2012-09-29 NOTE — Telephone Encounter (Signed)
Pt never called back. .Janet Hughes  

## 2012-10-23 ENCOUNTER — Other Ambulatory Visit: Payer: Self-pay | Admitting: Family Medicine

## 2012-10-24 ENCOUNTER — Ambulatory Visit: Payer: Medicaid Other | Admitting: Family Medicine

## 2012-11-17 ENCOUNTER — Other Ambulatory Visit: Payer: Self-pay | Admitting: Family Medicine

## 2012-11-17 ENCOUNTER — Other Ambulatory Visit: Payer: Self-pay

## 2012-12-22 ENCOUNTER — Other Ambulatory Visit: Payer: Self-pay | Admitting: Family Medicine

## 2013-01-03 ENCOUNTER — Ambulatory Visit (INDEPENDENT_AMBULATORY_CARE_PROVIDER_SITE_OTHER): Payer: PRIVATE HEALTH INSURANCE | Admitting: Family Medicine

## 2013-01-03 ENCOUNTER — Encounter: Payer: Self-pay | Admitting: Family Medicine

## 2013-01-03 VITALS — BP 135/84 | HR 83 | Temp 98.6°F | Ht 62.0 in | Wt 120.0 lb

## 2013-01-03 DIAGNOSIS — G4700 Insomnia, unspecified: Secondary | ICD-10-CM

## 2013-01-03 DIAGNOSIS — R41840 Attention and concentration deficit: Secondary | ICD-10-CM

## 2013-01-03 DIAGNOSIS — F411 Generalized anxiety disorder: Secondary | ICD-10-CM

## 2013-01-03 DIAGNOSIS — F419 Anxiety disorder, unspecified: Secondary | ICD-10-CM

## 2013-01-03 DIAGNOSIS — IMO0001 Reserved for inherently not codable concepts without codable children: Secondary | ICD-10-CM | POA: Insufficient documentation

## 2013-01-03 DIAGNOSIS — Z309 Encounter for contraceptive management, unspecified: Secondary | ICD-10-CM

## 2013-01-03 HISTORY — DX: Insomnia, unspecified: G47.00

## 2013-01-03 MED ORDER — MEDROXYPROGESTERONE ACETATE 150 MG/ML IM SUSP
150.0000 mg | Freq: Once | INTRAMUSCULAR | Status: AC
  Administered 2013-01-03: 150 mg via INTRAMUSCULAR

## 2013-01-03 MED ORDER — TRAZODONE HCL 50 MG PO TABS: 25.0000 mg | ORAL_TABLET | Freq: Every evening | ORAL | Status: DC | PRN

## 2013-01-03 MED ORDER — ALPRAZOLAM 0.5 MG PO TABS: 0.5000 mg | ORAL_TABLET | Freq: Every evening | ORAL | Status: DC | PRN

## 2013-01-03 NOTE — Assessment & Plan Note (Signed)
Much improved since she has been followed by psychiatry and prescribed Adderall. The Adderall is however having side effects on her sleep. Will treat insomnia with trazodone. Encouraged patient to talk with her psychiatrist and see if there is any alternative that would help her not have such trouble with sleep.

## 2013-01-03 NOTE — Progress Notes (Signed)
Patient ID: Janet Hughes    DOB: 11-04-1980, 32 y.o.   MRN: [id number] --- Subjective:  Janet Hughes is a 32 y.o.female who presents for a refill on Xanax. She has been started on Adderall by a psychiatrist that she sees at [facility] She is on 20-30 mg of Adderall depending on her schedule during that day. 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 She feels like her stress level is improved and that she is calm her during the day now that the Adderall has been started. She does have however some problems with sleep. She feels like she cannot sleep and have restful sleep. She denies any caffeine use. She denies any TV before bedtime. She has tried melatonin Motrin PM which have not helped. She uses a fourth of a tablet of Xanax 0.5 mg 4 times a day when she is busy at school. This seems to help her with her nerves.  - She also states that she has run out of her birth control medicine due to her insurance not covering it. Her last dose was on Sunday of this last week. She missed Monday and today's dose. She had unprotected sex yesterday. She would like to depo shot. LMP was 12/26/2012.  ROS: see HPI Past Medical History: reviewed and updated medications and allergies. Social History: Tobacco: current smoker   Objective: Filed Vitals:   01/03/13 0934  BP: 135/84  Pulse: 83  Temp: 98.6 F (37 C)    Physical Examination:   General appearance - alert, well appearing, and in no distress Neck - supple, no significant adenopathy Chest - clear to auscultation, no wheezes, rales or rhonchi, symmetric air entry Heart - normal rate, regular rhythm, normal S1, S2, no murmurs Abdomen - soft, nontender, nondistended, no masses or organomegaly Extremities - no edema

## 2013-01-03 NOTE — Assessment & Plan Note (Signed)
Likely from Adderall. 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 Prescribed trazodone for sleep aid.

## 2013-01-03 NOTE — Assessment & Plan Note (Signed)
OCPs no longer covered by her insurance plan. Would like depo. Since patient had unprotected sex yesterday and ran out of her birth control 2 days ago, will give her depo today and repeat a pregnancy test in 2 weeks. Pregnancy test today was negative.

## 2013-01-03 NOTE — Assessment & Plan Note (Signed)
Xanax refilled.  

## 2013-01-03 NOTE — Patient Instructions (Signed)
For sleep, try taking the trazodone half a tablet to one tablet at night. Please return in 2 weeks for repeat pregnancy test for nurse visit. Followup in 3 months

## 2013-01-17 ENCOUNTER — Other Ambulatory Visit (INDEPENDENT_AMBULATORY_CARE_PROVIDER_SITE_OTHER): Payer: PRIVATE HEALTH INSURANCE

## 2013-01-17 DIAGNOSIS — Z309 Encounter for contraceptive management, unspecified: Secondary | ICD-10-CM

## 2013-01-17 LAB — POCT URINE PREGNANCY: Preg Test, Ur: NEGATIVE

## 2013-01-17 NOTE — Progress Notes (Signed)
Pt returned for urine pregnancy test = negative.   Dewitt HoesBAJORDAN, MLS

## 2013-01-24 ENCOUNTER — Encounter: Payer: Self-pay | Admitting: Family Medicine

## 2013-01-24 ENCOUNTER — Other Ambulatory Visit (HOSPITAL_COMMUNITY)
Admission: RE | Admit: 2013-01-24 | Discharge: 2013-01-24 | Disposition: A | Discharge status: Home / Self Care | Payer: Medicaid Other | Source: Ambulatory Visit | Attending: Family Medicine | Admitting: Family Medicine

## 2013-01-24 ENCOUNTER — Ambulatory Visit (INDEPENDENT_AMBULATORY_CARE_PROVIDER_SITE_OTHER): Payer: Medicaid Other | Admitting: Family Medicine

## 2013-01-24 VITALS — BP 143/75 | HR 93 | Temp 98.2°F | Ht 62.0 in | Wt 118.0 lb

## 2013-01-24 DIAGNOSIS — Z113 Encounter for screening for infections with a predominantly sexual mode of transmission: Secondary | ICD-10-CM | POA: Insufficient documentation

## 2013-01-24 DIAGNOSIS — N760 Acute vaginitis: Secondary | ICD-10-CM

## 2013-01-24 LAB — POCT WET PREP (WET MOUNT): Clue Cells Wet Prep Whiff POC: POSITIVE

## 2013-01-24 MED ORDER — METRONIDAZOLE 500 MG PO TABS: 500.0000 mg | ORAL_TABLET | Freq: Two times a day (BID) | ORAL | Status: DC

## 2013-01-24 NOTE — Assessment & Plan Note (Addendum)
+  Trichomonas on wet prep, history/clinically supportive. No evidence of yeast or BV today Patient tearful with diagnosis today, given concern for etiology of STI, she plans to investigate further regarding her partners history, and agrees to complete STI testing.  Plan: 1. Metronidazole 500mg  BID x 7 days, provided 1 refill for partner 2. No sexual intercourse x 10 days, completion of treatment 3. GC/CT swab collected, HIV, RPR pending - patient to be called with results 4. May call for Diflucan if develops secondary yeast infection, encourage RTC if persistence of symptoms > 1 week

## 2013-01-24 NOTE — Progress Notes (Signed)
Subjective:     Patient ID: Janet Hughes, female   DOB: 08/17/1980, 33 y.o.   MRN: [id number]  Patient presents for same day appointment today.  HPI  VAGINITIS / VAGINAL DISCHARGE: Reports recent increased vaginal discharge and vaginal irritation / itching over past 1-2 weeks, discharge described as green. Similar to previous BV / yeast infections (but with inc discharge). No new sexual partners in past 4 years. No vaginal sexual intercourse since Depo-Provera injection 01/03/13. Interested in STI testing today (HIV, RPR, GC/CT). Denies vaginal bleeding, abdominal pain, fever/chills, dysuria, urinary frequency, n/v.  Review of Systems  As above HPI    Objective:   Physical Exam  BP 143/75  Pulse 93  Temp(Src) 98.2 F (36.8 C) (Oral)  Ht 5\' 2"  (1.575 m)  Wt 118 lb (53.524 kg)  BMI 21.58 kg/m2  LMP 12/26/2012  Gen - thin, well-appearing, NAD HEENT - MMM Heart - RRR, no murmurs heard Lungs - CTAB, no wheezing, crackles, or rhonchi. Normal work of breathing. Abd - soft, NTND, no masses, +active BS Ext - non-tender, no edema Skin - warm, dry, no rashes Neuro - awake, alert, oriented GU: normal external female genitalia, no inguinal tenderness or abnormal lymph nodes, no rash or swelling of Bartholin glands Pelvic: significant increased green thick muco-purulent discharge, no evidence of bleeding or increased vaginal/cervical friability, no lesions observed. Normal appearing cervix with inc discharge. No cervical motion tenderness, normal bi-manual exam w/o adnexal tenderness or enlargement     Assessment:     See specific A&P problem list for details.      Plan:     See specific A&P problem list for details.

## 2013-01-24 NOTE — Patient Instructions (Addendum)
Dear Janet [name], Thank you for coming in to clinic today. It was good to meet you!  Today we discussed your Vaginal Discharge. 1. The results of the wet prep test showed that you do indeed have Trichomonas. However, there was no yeast and no clue cells seen, meaning that you do not have BV (although the treatment is the same) 2. Start Metronidazole (Flagyl) 500mg  twice daily for 7 days (i have also provided 1 refill for your partner to be treated, however I recommend no sexual intercourse for 10 days, until completed therapy). 3. We also collected blood for HIV / Syphilis (RPR) testing today, it may take several days to weeks for these results, we will try to call you within 1 week with results if available. Otherwise please contact us if there are any questions.  Please schedule a follow-up appointment with Dr. Gwenlyn SaranLosq or any available provider in 1-2 weeks if symptoms to not improve.  If you have any other questions or concerns, please feel free to call the clinic to contact me. You may also schedule an earlier appointment if necessary.  However, if your symptoms get significantly worse, please go to the Emergency Department to seek immediate medical attention.  Saralyn PilarAlexander Verle Wheeling, DO [facility]

## 2013-01-25 LAB — HIV ANTIBODY (ROUTINE TESTING W REFLEX): HIV: NONREACTIVE

## 2013-01-25 LAB — RPR

## 2013-01-26 ENCOUNTER — Telehealth: Payer: Self-pay | Admitting: *Deleted

## 2013-01-26 ENCOUNTER — Ambulatory Visit: Payer: PRIVATE HEALTH INSURANCE | Admitting: Family Medicine

## 2013-01-26 NOTE — Telephone Encounter (Signed)
Called pt. LMVM to call back. Janet Hughes.Paulanthony Gleaves, Renato Battleshekla

## 2013-01-26 NOTE — Telephone Encounter (Signed)
Message copied by Arlyss RepressSLADE, Jaelah Hauth on Thu Jan 26, 2013  9:01 AM ------      Message from: Smitty CordsKARAMALEGOS, ALEXANDER J      Created: Thu Jan 26, 2013  7:20 AM      Regarding: one more negative result for patient       Sorry there was one more result pending for this patient when I had sent the message requesting them to be called with results.            Negative gonorrhea and chlamydia.      (in addition to previous, Negative HIV and RPR).            If you could also relay this information I would really appreciate it.            Thank you! Sorry for repeat message.            Saralyn PilarAlexander Karamalegos, DO      [facility], PGY-1       ------

## 2013-01-27 MED ORDER — FLUCONAZOLE 150 MG PO TABS: 150.0000 mg | ORAL_TABLET | Freq: Once | ORAL | Status: DC

## 2013-01-27 NOTE — Telephone Encounter (Signed)
Patient c/o itching and it has gotten worse since starting on metronidazole.  Has hx of yeast infection when on abx or metronidazole.  Patient would like Rx for diflucan sent to [facility].  Will route request to Dr. Althea CharonKaramalegos (saw him recently for +trich).  Gaylene Brooksichardson, Jeannette Ann, RN

## 2013-01-27 NOTE — Telephone Encounter (Signed)
Agree and sent new rx Diflucan 150mg  PO x 1 (with 1 refill) for yeast infection. If persistent symptoms after Diflucan and completed Metronidazole. Would request that patient return to clinic.  Saralyn PilarAlexander Karamalegos, DO [facility], PGY-1

## 2013-02-13 ENCOUNTER — Telehealth: Payer: Self-pay | Admitting: Family Medicine

## 2013-02-13 NOTE — Telephone Encounter (Signed)
Will fwd. To Dr.Karamalegos for review. Thanks. Janet Hughes.Janet Hughes, Janet Hughes

## 2013-02-13 NOTE — Telephone Encounter (Signed)
Pt returned Janet Hughes's call and stated that she took whatever medication they told her to take. jw

## 2013-02-13 NOTE — Telephone Encounter (Signed)
LMVM to call back.  Did pt take Diflucan 1/16. Janet Hughes.Tahani Potier, Renato Battleshekla

## 2013-02-13 NOTE — Telephone Encounter (Signed)
Still itching after finishing all meds Please advise

## 2013-02-13 NOTE — Telephone Encounter (Signed)
Reviewed recent chart, called [facility] pharmacy and patient still has Diflucan 150mg  tab refill for 1 tablet available for pick-up. Seen in OV for vaginitis, dx with Trichomoniasis, no clinical evidence of yeast at that time, but prone to developing secondary vaginal candidiasis secondary to antibiotics. Previously called in Diflucan 150mg  PO x 1 with 1 refill, has taken Diflucan x 1. Would recommend that patient pick up the refill and take it. If persistent symptoms within 24-48 hours, would advise to call clinic back to schedule an appointment to be seen for further evaluation.  If you could please, contact patient to relay this message. Thank you.  Saralyn PilarAlexander Love Milbourne, DO [facility], PGY-1

## 2013-02-14 NOTE — Telephone Encounter (Signed)
Called pt. LMVM for pt to call back. Please see message. Thanks. Lorenda Hatchet.Garren Greenman, Renato Battleshekla

## 2013-02-16 ENCOUNTER — Ambulatory Visit (INDEPENDENT_AMBULATORY_CARE_PROVIDER_SITE_OTHER): Payer: Medicaid Other | Admitting: Family Medicine

## 2013-02-16 VITALS — BP 127/84 | HR 82 | Temp 98.2°F | Ht 62.0 in | Wt 118.0 lb

## 2013-02-16 DIAGNOSIS — N898 Other specified noninflammatory disorders of vagina: Secondary | ICD-10-CM

## 2013-02-16 DIAGNOSIS — N760 Acute vaginitis: Secondary | ICD-10-CM

## 2013-02-16 LAB — POCT WET PREP (WET MOUNT): CLUE CELLS WET PREP WHIFF POC: NEGATIVE

## 2013-02-16 MED ORDER — FLUCONAZOLE 150 MG PO TABS: 150.0000 mg | ORAL_TABLET | Freq: Once | ORAL | Status: DC

## 2013-02-16 MED ORDER — CEPHALEXIN 500 MG PO CAPS
500.0000 mg | ORAL_CAPSULE | Freq: Four times a day (QID) | ORAL | Status: DC
Start: 2013-02-16 — End: 2014-02-09

## 2013-02-16 NOTE — Progress Notes (Signed)
   Subjective:    Patient ID: Janet Hughes, female    DOB: 12/20/1980, 33 y.o.   MRN: [id number]  HPI [age] year old female presents for re-evaluation of vaginal discharge.  1) Vaginal discharge - Patient recently seen on 01/24/13 and was diagnosed with Trichomonas. GC/Chlamydia was negative. She was treated with a 7 day course of flagyl. - She reports that she had improvement after a few days of treatment.  However, prior to completion of the antibiotic, her vaginal discharge returned.  - Discharge is green in color, with associated itching.  She has not had intercourse since prior to the previous office visit.   - No nausea, vomiting, abdominal pain, fevers, chills.  Review of Systems Per HPI    Objective:   Physical Exam Filed Vitals:   02/16/13 1510  BP: 127/84  Pulse: 82  Temp: 98.2 F (36.8 C)   Exam: General: well appearing female in NAD.  Pelvic Exam:        External: normal female genitalia without lesions or masses        Vagina: white discharge noted in vaginal vault.        Cervix: normal without lesions or masses        Adnexa: normal bimanual exam without masses or fullness or tenderness.         Uterus: normal by palpation        Samples for Wet prep obtained today.    Assessment & Plan:  See Problem List

## 2013-02-16 NOTE — Assessment & Plan Note (Addendum)
Wet prep today revealed bacterial vaginosis. No evidence of Trichomonas. Given patient's recurrent bacterial vaginosis, will treat with Keflex x 7 days.

## 2013-02-16 NOTE — Patient Instructions (Signed)
It was nice to see you .  Take the antibiotic as prescribed. Use the diflucan if needed.  Follow up as needed.

## 2013-03-07 ENCOUNTER — Ambulatory Visit: Payer: Medicaid Other | Admitting: Family Medicine

## 2013-03-24 ENCOUNTER — Other Ambulatory Visit: Payer: Self-pay | Admitting: Family Medicine

## 2013-03-24 NOTE — Telephone Encounter (Signed)
Called in xanax 0.5mg  take 1 tablet as needed for anxiety dispense 30 tablets, no refills.  Left message with prescription.   Marena ChancyStephanie Briani Maul, PGY-3 Family Medicine Resident

## 2013-04-04 ENCOUNTER — Ambulatory Visit (INDEPENDENT_AMBULATORY_CARE_PROVIDER_SITE_OTHER): Payer: Medicaid Other | Admitting: *Deleted

## 2013-04-04 DIAGNOSIS — Z309 Encounter for contraceptive management, unspecified: Secondary | ICD-10-CM

## 2013-04-04 MED ORDER — MEDROXYPROGESTERONE ACETATE 150 MG/ML IM SUSP
150.0000 mg | Freq: Once | INTRAMUSCULAR | Status: AC
  Administered 2013-04-04: 150 mg via INTRAMUSCULAR

## 2013-04-04 NOTE — Progress Notes (Signed)
   Pt in for Depo Provera injection.  Pt tolerated Depo injection. Depo given Left upper outer quadrant.  Next injection due June 9 - July 04, 2013.  Reminder card given. Clovis PuMartin, Tamika L, RN

## 2013-07-10 ENCOUNTER — Ambulatory Visit: Payer: Medicaid Other

## 2013-09-12 ENCOUNTER — Encounter: Payer: Self-pay | Admitting: Family Medicine

## 2013-09-12 ENCOUNTER — Ambulatory Visit (INDEPENDENT_AMBULATORY_CARE_PROVIDER_SITE_OTHER): Payer: Medicaid Other | Admitting: Family Medicine

## 2013-09-12 VITALS — BP 130/86 | HR 81 | Temp 98.1°F | Wt 124.0 lb

## 2013-09-12 DIAGNOSIS — Z111 Encounter for screening for respiratory tuberculosis: Secondary | ICD-10-CM

## 2013-09-12 DIAGNOSIS — Z0000 Encounter for general adult medical examination without abnormal findings: Secondary | ICD-10-CM

## 2013-09-12 DIAGNOSIS — G4700 Insomnia, unspecified: Secondary | ICD-10-CM

## 2013-09-12 MED ORDER — ALPRAZOLAM 0.5 MG PO TABS
0.5000 mg | ORAL_TABLET | Freq: Every evening | ORAL | Status: DC | PRN
Start: 2013-09-12 — End: 2014-02-09

## 2013-09-12 NOTE — Progress Notes (Signed)
   Subjective:    Patient ID: Janet Hughes, female    DOB: 1980/05/28, 33 y.o.   MRN: [id number]  Gynecologic Exam   Pt presents for wellness exam. Only current health concern is rare palpitations, started when started taking adderal. No CP or SOB.   Review of Systems See HPI    Objective:   Physical Exam  Nursing note and vitals reviewed. Constitutional: She is oriented to person, place, and time. She appears well-developed and well-nourished. No distress.  HENT:  Head: Normocephalic and atraumatic.  Eyes: Conjunctivae and EOM are normal. Pupils are equal, round, and reactive to light. Right eye exhibits no discharge. Left eye exhibits no discharge. No scleral icterus.  Cardiovascular: Normal rate, regular rhythm and normal heart sounds.  Exam reveals no gallop and no friction rub.   No murmur heard. Pulmonary/Chest: Effort normal and breath sounds normal. No respiratory distress. She has no wheezes.  Abdominal: Soft. Bowel sounds are normal. She exhibits no distension. There is no tenderness. There is no rebound.  Neurological: She is alert and oriented to person, place, and time.  Skin: Skin is warm and dry. She is not diaphoretic.  Psychiatric: She has a normal mood and affect. Her behavior is normal.          Assessment & Plan:

## 2013-09-12 NOTE — Assessment & Plan Note (Signed)
Refilled xanax, working well, taking 2-3 per month

## 2013-09-12 NOTE — Assessment & Plan Note (Signed)
UTD on health maintenance, pap due in July 2019

## 2013-09-12 NOTE — Patient Instructions (Signed)
Thank you for coming in today. Your heart sounds regular without any murmur or other concerning signs. The rest of your exam is also normal. If you feel like your heart racing is getting worse or becomes associated with pain or shortness of breath, please come see [place].  You will not need another pap smear until 2019.  We would like to see you for another checkup in 1 year or sooner if you have any concerns.  Thank you.

## 2013-09-14 ENCOUNTER — Encounter: Payer: Self-pay | Admitting: *Deleted

## 2013-09-14 ENCOUNTER — Ambulatory Visit (INDEPENDENT_AMBULATORY_CARE_PROVIDER_SITE_OTHER): Payer: Medicaid Other | Admitting: *Deleted

## 2013-09-14 DIAGNOSIS — Z111 Encounter for screening for respiratory tuberculosis: Secondary | ICD-10-CM

## 2013-09-14 LAB — TB SKIN TEST
Induration: 0 mm
TB Skin Test: NEGATIVE

## 2013-09-14 NOTE — Progress Notes (Signed)
   PPD Reading Note PPD read and results entered in EpicCare. Result: 0 mm induration. Interpretation: Negative; area bruised. Clovis Pu, RN

## 2014-02-02 ENCOUNTER — Ambulatory Visit: Payer: Medicaid Other | Admitting: Family Medicine

## 2014-02-09 ENCOUNTER — Encounter: Payer: Self-pay | Admitting: Family Medicine

## 2014-02-09 ENCOUNTER — Ambulatory Visit (INDEPENDENT_AMBULATORY_CARE_PROVIDER_SITE_OTHER): Payer: Medicaid Other | Admitting: Family Medicine

## 2014-02-09 VITALS — BP 137/87 | HR 81 | Temp 97.6°F | Ht 62.0 in | Wt 122.0 lb

## 2014-02-09 DIAGNOSIS — F419 Anxiety disorder, unspecified: Secondary | ICD-10-CM

## 2014-02-09 DIAGNOSIS — Z30011 Encounter for initial prescription of contraceptive pills: Secondary | ICD-10-CM | POA: Insufficient documentation

## 2014-02-09 HISTORY — DX: Encounter for initial prescription of contraceptive pills: Z30.011

## 2014-02-09 MED ORDER — ALPRAZOLAM 0.5 MG PO TABS: 0.5000 mg | ORAL_TABLET | Freq: Every evening | ORAL | Status: DC | PRN

## 2014-02-09 MED ORDER — NORGESTIM-ETH ESTRAD TRIPHASIC 0.18/0.215/0.25 MG-25 MCG PO TABS: 1.0000 | ORAL_TABLET | Freq: Every day | ORAL | Status: DC

## 2014-02-09 NOTE — Progress Notes (Addendum)
Patient ID: Janet Hughes, female   DOB: 01/20/1980, 34 y.o.   MRN: [id number]   HPI  Patient presents today for refills and birth control counseling  Birth control Patient previously used Depo-Provera but had hair loss with it and so stopped. She would like to try Ortho Tri-Cyclen Lo, she had good success with this previously  Xanax refill needed today She takes this for anxiety and insomnia, she uses an average of 10 per month. States that her life is very stressful this time however no SI or series interference with her daily life except for at bedtime. 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  She is currently menstruating  Smoking status noted ROS: Per HPI  Objective: BP 137/87 mmHg  Pulse 81  Temp(Src) 97.6 F (36.4 C) (Oral)  Ht 5\' 2"  (1.575 m)  Wt 122 lb (55.339 kg)  BMI 22.31 kg/m2  LMP 02/04/2014 Gen: NAD, alert, cooperative with exam HEENT: NCAT CV: RRR, good S1/S2, no murmur Resp: CTABL, no wheezes, non-labored Neuro: Alert and oriented, No gross deficits  Assessment and plan:  Oral contraceptive prescribed Previously on Depo-Provera but had her loss Would like to go back to Ortho Tri-Cyclen low     Anxiety Anxiety with insomnia, possibly Adderall contributing Refilled Xanax with 0 refills Ask her to follow-up with her PCP for further refills    Meds ordered this encounter  Medications  . ALPRAZolam (XANAX) 0.5 MG tablet    Sig: Take 1 tablet (0.5 mg total) by mouth at bedtime as needed for sleep.    Dispense:  30 tablet    Refill:  0  . Norgestimate-Ethinyl Estradiol Triphasic (ORTHO TRI-CYCLEN LO) 0.18/0.215/0.25 MG-25 MCG tab    Sig: Take 1 tablet by mouth daily.    Dispense:  1 Package    Refill:  11

## 2014-02-09 NOTE — Assessment & Plan Note (Signed)
Anxiety with insomnia, possibly Adderall contributing Refilled Xanax with 0 refills Ask her to follow-up with her PCP for further refills

## 2014-02-09 NOTE — Patient Instructions (Signed)
Great to see you!  Come back to see Dr. Richarda BladeAdamo in 3 months  Please call if there are any problems with the medicine

## 2014-02-09 NOTE — Assessment & Plan Note (Signed)
Previously on Depo-Provera but had her loss Would like to go back to Ortho Tri-Cyclen low

## 2014-04-03 ENCOUNTER — Other Ambulatory Visit (HOSPITAL_COMMUNITY)
Admission: RE | Admit: 2014-04-03 | Discharge: 2014-04-03 | Disposition: A | Discharge status: Home / Self Care | Payer: Medicaid Other | Source: Ambulatory Visit | Attending: Family Medicine | Admitting: Family Medicine

## 2014-04-03 ENCOUNTER — Ambulatory Visit (INDEPENDENT_AMBULATORY_CARE_PROVIDER_SITE_OTHER): Payer: Medicaid Other | Admitting: Family Medicine

## 2014-04-03 ENCOUNTER — Encounter: Payer: Self-pay | Admitting: Family Medicine

## 2014-04-03 VITALS — BP 122/70 | Temp 98.7°F | Wt 124.0 lb

## 2014-04-03 DIAGNOSIS — N760 Acute vaginitis: Secondary | ICD-10-CM

## 2014-04-03 DIAGNOSIS — Z113 Encounter for screening for infections with a predominantly sexual mode of transmission: Secondary | ICD-10-CM | POA: Insufficient documentation

## 2014-04-03 DIAGNOSIS — N898 Other specified noninflammatory disorders of vagina: Secondary | ICD-10-CM

## 2014-04-03 LAB — POCT WET PREP (WET MOUNT): Clue Cells Wet Prep Whiff POC: NEGATIVE

## 2014-04-03 MED ORDER — AZITHROMYCIN 500 MG PO TABS
1000.0000 mg | ORAL_TABLET | Freq: Once | ORAL | Status: AC
  Administered 2014-04-03: 1000 mg via ORAL

## 2014-04-03 MED ORDER — CEFTRIAXONE SODIUM 1 G IJ SOLR
250.0000 mg | Freq: Once | INTRAMUSCULAR | Status: AC
  Administered 2014-04-03: 250 mg via INTRAMUSCULAR

## 2014-04-03 NOTE — Patient Instructions (Signed)

## 2014-04-03 NOTE — Progress Notes (Signed)
  Subjective:    Janet Hughes is a 34 y.o. female who presents for sexually transmitted disease check. Sexual history reviewed with the patient. STI Exposure: sexual contact with individual with uncertain background 4 days ago. Previous history of STI GC, chlamydia. Current symptoms vaginal discharge: copious and white. Contraception: condoms Menstrual History: OB History    No data available     The following portions of the patient's history were reviewed and updated as appropriate: allergies, current medications, past family history, past medical history, past social history, past surgical history and problem list.  Review of Systems Pertinent items are noted in HPI.    Objective:    BP 122/70 mmHg  Temp(Src) 98.7 F (37.1 C) (Oral)  Wt 124 lb (56.246 kg)  LMP 03/05/2014 (Approximate) General:   alert, cooperative and appears stated age  Pelvis:  External genitalia: normal general appearance Cervix: normal appearance  Cultures:  GC and Chlamydia genprobes     Assessment:    Possible STD exposure, screening for STD   Vaginitis    Plan:     WEt prep, Tx as indicated      GC/Chlamydia probe, discuss when results come back  Addendum: Wet prep negative for BV and yeast, but immense number of WBC.  After discussion with recent sexual contact, unprotected 4 days ago, vaginal d/c and wet prep, pt would like to be tx with 250 mg IM Rocephin and 1 gm Azithro.

## 2014-04-03 NOTE — Addendum Note (Signed)
Addended by: Henri MedalHARTSELL, JAZMIN M on: 04/03/2014 10:26 AM   Modules accepted: Orders

## 2014-04-04 ENCOUNTER — Telehealth: Payer: Self-pay | Admitting: Family Medicine

## 2014-04-04 LAB — CERVICOVAGINAL ANCILLARY ONLY
CHLAMYDIA, DNA PROBE: NEGATIVE
Neisseria Gonorrhea: NEGATIVE

## 2014-04-04 NOTE — Telephone Encounter (Signed)
Let pt know her GC/Chlamydia were both negative.  No further questions at this time.  Twana FirstBryan R. Paulina FusiHess, DO of [facility] 04/04/2014, 1:42 PM

## 2014-05-03 ENCOUNTER — Ambulatory Visit: Payer: Medicaid Other | Admitting: Family Medicine

## 2014-05-17 ENCOUNTER — Ambulatory Visit: Payer: Medicaid Other | Admitting: Family Medicine

## 2014-05-25 ENCOUNTER — Encounter: Payer: Self-pay | Admitting: Family Medicine

## 2014-05-25 ENCOUNTER — Other Ambulatory Visit (HOSPITAL_COMMUNITY)
Admission: RE | Admit: 2014-05-25 | Discharge: 2014-05-25 | Disposition: A | Discharge status: Home / Self Care | Payer: Medicaid Other | Source: Ambulatory Visit | Attending: Family Medicine | Admitting: Family Medicine

## 2014-05-25 ENCOUNTER — Ambulatory Visit (INDEPENDENT_AMBULATORY_CARE_PROVIDER_SITE_OTHER): Payer: Medicaid Other | Admitting: Family Medicine

## 2014-05-25 VITALS — BP 140/92 | HR 90 | Temp 98.3°F | Ht 62.0 in | Wt 120.0 lb

## 2014-05-25 DIAGNOSIS — R87610 Atypical squamous cells of undetermined significance on cytologic smear of cervix (ASC-US): Secondary | ICD-10-CM

## 2014-05-25 DIAGNOSIS — Z01419 Encounter for gynecological examination (general) (routine) without abnormal findings: Secondary | ICD-10-CM | POA: Insufficient documentation

## 2014-05-25 DIAGNOSIS — Z124 Encounter for screening for malignant neoplasm of cervix: Secondary | ICD-10-CM

## 2014-05-25 DIAGNOSIS — Z1151 Encounter for screening for human papillomavirus (HPV): Secondary | ICD-10-CM | POA: Diagnosis present

## 2014-05-25 MED ORDER — ALPRAZOLAM 0.5 MG PO TABS: 0.5000 mg | ORAL_TABLET | Freq: Every evening | ORAL | Status: DC | PRN

## 2014-05-25 NOTE — Patient Instructions (Signed)
Pap Test A Pap test is a procedure done in a clinic office to evaluate cells that are on the surface of the cervix. The cervix is the lower portion of the uterus and upper portion of the vagina. For some women, the cervical region has the potential to form cancer. With consistent evaluations by your caregiver, this type of cancer can be prevented.  If a Pap test is abnormal, it is most often a result of a previous exposure to human papillomavirus (HPV). HPV is a virus that can infect the cells of the cervix and cause dysplasia. Dysplasia is where the cells no longer look normal. If a woman has been diagnosed with high-grade or severe dysplasia, they are at higher risk of developing cervical cancer. People diagnosed with low-grade dysplasia should still be seen by their caregiver because there is a small chance that low-grade dysplasia could develop into cancer.  LET YOUR CAREGIVER KNOW ABOUT:  Recent sexually transmitted infection (STI) you have had.  Any new sex partners you have had.  History of previous abnormal Pap tests results.  History of previous cervical procedures you have had (colposcopy, biopsy, loop electrosurgical excision procedure [LEEP]).  Concerns you have had regarding unusual vaginal discharge.  History of pelvic pain.  Your use of birth control. BEFORE THE PROCEDURE  Ask your caregiver when to schedule your Pap test. It is best not to be on your period if your caregiver uses a wooden spatula to collect cells or applies cells to a glass slide. Newer techniques are not so sensitive to the timing of a menstrual cycle.  Do not douche or have sexual intercourse for 24 hours before the test.   Do not use vaginal creams or tampons for 24 hours before the test.   Empty your bladder just before the test to lessen any discomfort.  PROCEDURE You will lie on an exam table with your feet in stirrups. A warm metal or plastic instrument (speculum) is placed in your vagina. This  instrument allows your caregiver to see the inside of your vagina and look at your cervix. A small, plastic brush or wooden spatula is then used to collect cervical cells. These cells are placed in a lab specimen container. The cells are looked at under a microscope. A specialist will determine if the cells are normal.  AFTER THE PROCEDURE Make sure to get your test results.If your results come back abnormal, you may need further testing.  Document Released: 03/21/2002 Document Revised: 03/23/2011 Document Reviewed: 12/25/2010 ExitCare Patient Information 2015 [employer]. This information is not intended to replace advice given to you by your health care provider. Make sure you discuss any questions you have with your health care provider.  

## 2014-05-29 LAB — CYTOLOGY - PAP

## 2014-05-30 ENCOUNTER — Telehealth: Payer: Self-pay | Admitting: *Deleted

## 2014-05-30 NOTE — Telephone Encounter (Signed)
-----   Message from Abram SanderElena M Adamo, MD sent at 05/30/2014  8:24 AM EDT ----- Please let patient know that her pap and HPV tests were normal. She will need her next pap in 5 years.

## 2014-05-30 NOTE — Telephone Encounter (Signed)
Left message on voicemail for patient to call back. 

## 2014-05-31 NOTE — Assessment & Plan Note (Signed)
Repeat pap on patient's request, normal 2 years ago

## 2014-05-31 NOTE — Progress Notes (Signed)
   Subjective:    Patient ID: Janet Hughes, female    DOB: 02/19/1980, 34 y.o.   MRN: [id number]  HPI Pt presents for pap smear. Reports she did not get it at last visit because it was not indicated but has been worrying about it since and would like to get it anyway. She has a remote history of abnormal paps but has normal paps with negative high risk HPV over the past few years, including 1 in 2014.   Review of Systems  All other systems reviewed and are negative.      Objective:   Physical Exam  Constitutional: She is oriented to person, place, and time. She appears well-developed and well-nourished. No distress.  HENT:  Head: Normocephalic and atraumatic.  Eyes: Conjunctivae are normal. Right eye exhibits no discharge. Left eye exhibits no discharge. No scleral icterus.  Neck: Normal range of motion. Neck supple.  Cardiovascular: Normal rate.   Pulmonary/Chest: Effort normal. No respiratory distress.  Abdominal: Soft. She exhibits no distension. There is no tenderness.  Genitourinary: Vagina normal and uterus normal. No vaginal discharge found.  Musculoskeletal: She exhibits no edema.  Neurological: She is alert and oriented to person, place, and time.  Skin: Skin is warm and dry. No rash noted. She is not diaphoretic.  Psychiatric: She has a normal mood and affect. Her behavior is normal.  Nursing note and vitals reviewed.         Assessment & Plan:

## 2014-07-09 ENCOUNTER — Telehealth: Payer: Self-pay | Admitting: Family Medicine

## 2014-07-09 NOTE — Telephone Encounter (Signed)
Pt called because the doctors office she was going to for her Adderall is now out of network for her Medicaid. She wanted to know can you prescribe this or do a referral to another place that would be in Medicaid's network jw

## 2014-07-09 NOTE — Telephone Encounter (Signed)
I may be able to prescribe it but would need a visit to discuss this with her first. I would also be happy to refer her to a psychiatrist who can manage it. We can do it either way she wants. Thanks!

## 2014-07-10 NOTE — Telephone Encounter (Signed)
Pt informed and scheduled for an appt. Izella Ybanez, CMA  

## 2014-07-11 ENCOUNTER — Ambulatory Visit: Payer: Medicaid Other | Admitting: Family Medicine

## 2014-08-07 ENCOUNTER — Ambulatory Visit (INDEPENDENT_AMBULATORY_CARE_PROVIDER_SITE_OTHER): Payer: Medicaid Other | Admitting: Family Medicine

## 2014-08-07 ENCOUNTER — Encounter: Payer: Self-pay | Admitting: Family Medicine

## 2014-08-07 VITALS — BP 131/77 | HR 84 | Temp 97.7°F | Ht 62.0 in | Wt 125.7 lb

## 2014-08-07 DIAGNOSIS — F900 Attention-deficit hyperactivity disorder, predominantly inattentive type: Secondary | ICD-10-CM

## 2014-08-07 DIAGNOSIS — F909 Attention-deficit hyperactivity disorder, unspecified type: Secondary | ICD-10-CM

## 2014-08-07 HISTORY — DX: Attention-deficit hyperactivity disorder, unspecified type: F90.9

## 2014-08-07 MED ORDER — AMPHETAMINE-DEXTROAMPHETAMINE 20 MG PO TABS: 20.0000 mg | ORAL_TABLET | Freq: Two times a day (BID) | ORAL | Status: DC

## 2014-08-07 NOTE — Patient Instructions (Signed)
Thank you for coming in today!  We will refill your Adderall for the next 3 months. If anything goes wrong with the prescription payment wise call us and we can reassess. See you in 3 months.   Melatonin oral capsules and tablets What is this medicine? MELATONIN (mel uh TOH nin) is a dietary supplement. It is promoted to help maintain normal sleep patterns. The FDA has not approved this supplement for any medical use. This supplement may be used for other purposes; ask your health care provider or pharmacist if you have questions. This medicine may be used for other purposes; ask your health care provider or pharmacist if you have questions. COMMON BRAND NAME(S): Melatonex What should I tell my health care provider before I take this medicine? They need to know if you have any of these conditions: -cancer -if you frequently drink alcohol containing drinks -immune system problems -liver disease -seizure disorder -an unusual or allergic reaction to melatonin, other medicines, foods, dyes, or preservatives -pregnant or trying to get pregnant -breast-feeding How should I use this medicine? Take this supplement by mouth with a glass of water. This supplement is usually taken prior to bedtime. Do not chew or crush most tablets or capsules. Some tablets are chewable and are chewed before swallowing. Some tablets are meant to be dissolved in the mouth or under the tongue. Follow the directions on the package labeling, or take as directed by your health care professional. Do not take this supplement more often than directed. Talk to your pediatrician regarding the use of this supplement in children. This supplement is not recommended for use in children. Overdosage: If you think you have taken too much of this medicine contact a poison control center or emergency room at once. NOTE: This medicine is only for you. Do not share this medicine with others. What if I miss a dose? This does not apply;  this medicine is not for regular use. Do not take double or extra doses. What may interact with this medicine? Check with your doctor or healthcare professional if you are taking any of the following medications: -hormone medicines -medicines for blood pressure like nifedipine -medications for anxiety, depression, or other emotional or psychiatric problems -medications for seizures -medications for sleep -other herbal or dietary supplements -tamoxifen -treatments for cancer or immune disorders This list may not describe all possible interactions. Give your health care provider a list of all the medicines, herbs, non-prescription drugs, or dietary supplements you use. Also tell them if you smoke, drink alcohol, or use illegal drugs. Some items may interact with your medicine. What should I watch for while using this medicine? See your doctor if your symptoms do not get better or if they get worse. Do not take this supplement for more than 2 weeks unless your doctor tells you to. You may get drowsy or dizzy. Do not drive, use machinery, or do anything that needs mental alertness until you know how this medicine affects you. Do not stand or sit up quickly, especially if you are an older patient. This reduces the risk of dizzy or fainting spells. Alcohol may interfere with the effect of this medicine. Avoid alcoholic drinks. Talk to your doctor before you use this supplement if you are currently being treated for an emotional, mental, or sleep problem. This medicine may interfere with your treatment. Herbal or dietary supplements are not regulated like medicines. Rigid quality control standards are not required for dietary supplements. The purity and strength of these  products can vary. The safety and effect of this dietary supplement for a certain disease or illness is not well known. This product is not intended to diagnose, treat, cure or prevent any disease. The Food and Drug Administration suggests  the following to help consumers protect themselves: -Always read product labels and follow directions. -Natural does not mean a product is safe for humans to take. -Look for products that include USP after the ingredient name. This means that the manufacturer followed the standards of the [place] Pharmacopoeia. -Supplements made or sold by a nationally known food or drug company are more likely to be made under tight controls. You can write to the company for more information about how the product was made. What side effects may I notice from receiving this medicine? Side effects that you should report to your doctor or health care professional as soon as possible: -allergic reactions like skin rash, itching or hives, swelling of the face, lips, or tongue -breathing problems -confusion, forgetful -depressed, nervous, or other mood changes -fast or pounding heartbeat -trouble staying awake or alert during the day Side effects that usually do not require medical attention (report to your doctor or health care professional if they continue or are bothersome): -drowsiness, dizziness -headache -nightmares -upset stomach This list may not describe all possible side effects. Call your doctor for medical advice about side effects. You may report side effects to FDA at 1-800-FDA-1088. Where should I keep my medicine? Keep out of the reach of children. Store at room temperature or as directed on the package label. Protect from moisture. Throw away any unused supplement after the expiration date. NOTE: This sheet is a summary. It may not cover all possible information. If you have questions about this medicine, talk to your doctor, pharmacist, or health care provider.  2015, Elsevier/Gold Standard. (2012-11-11 18:36:27)

## 2014-08-07 NOTE — Progress Notes (Signed)
   Subjective:   Janet Hughes is a 34 y.o. 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 She takes her adderall at least once a day and takes a second dose on days when she works 12 hours. She tolerates the medication fine and has been stable on this regimen for about 6 months. She occasionally has some insomnia when she takes the second dose and occasionally uses xanax for this (got 30 pills in May and not out yet).  Review of Systems:  Per HPI. All other systems reviewed and are negative.   PMH, PSH, Medications, Allergies, and FmHx reviewed and updated in EMR.  Social History: non smoker  Objective:  BP 131/77 mmHg  Pulse 84  Temp(Src) 97.7 F (36.5 C) (Oral)  Ht  (1.575 m)  Wt 125 lb 11.2 oz (57.017 kg)  BMI 22.98 kg/m2  Gen:  34 y.o. female in NAD HEENT: NCAT, MMM, EOMI, PERRL, anicteric sclerae CV: RRR, no MRG, no JVD Resp: Non-labored, CTAB, no wheezes noted Abd: Soft, NTND, BS present, no guarding or organomegaly Ext: WWP, no edema MSK: Full ROM, strength intact Neuro: Alert and oriented, speech normal      Chemistry      Component Value Date/Time   NA 139 03/31/2012 0859   K 4.2 03/31/2012 0859   CL 105 03/31/2012 0859   CO2 27 03/31/2012 0859   BUN 15 03/31/2012 0859   CREATININE 0.94 03/31/2012 0859   CREATININE 1.05 09/02/2007 2121      Component Value Date/Time   CALCIUM 9.6 03/31/2012 0859   ALKPHOS 37* 09/02/2007 2121   AST 14 09/02/2007 2121   ALT 10 09/02/2007 2121   BILITOT 0.3 09/02/2007 2121      Lab Results  Component Value Date   WBC 3.2* 03/31/2012   HGB 12.9 03/31/2012   HCT 39.1 03/31/2012   MCV 85.6 03/31/2012   PLT 273 03/31/2012   Lab Results  Component Value Date   TSH 1.015  12/29/2011   No results found for: HGBA1C Assessment:     Janet Hughes is a 34 y.o. female here for ADHD follow-up    Plan:     See problem list for problem-specific plans.   Abram Sander, MD PGY-3,  [facility] 08/07/2014  3:05 PM

## 2014-08-07 NOTE — Assessment & Plan Note (Signed)
Reviewed psych records. Per patient she needs med managed by in network doctor for her insurance to cover it and her psychiatrist is not in network - will take over management of this med given she is stable and uncomplicated - gave 3 month supply of adderall, f/u in 3 months

## 2014-09-05 ENCOUNTER — Ambulatory Visit (INDEPENDENT_AMBULATORY_CARE_PROVIDER_SITE_OTHER): Payer: Medicaid Other | Admitting: Family Medicine

## 2014-09-05 VITALS — BP 126/79 | HR 80 | Temp 98.6°F | Ht 62.0 in | Wt 125.2 lb

## 2014-09-05 DIAGNOSIS — F419 Anxiety disorder, unspecified: Secondary | ICD-10-CM

## 2014-09-05 DIAGNOSIS — G4700 Insomnia, unspecified: Secondary | ICD-10-CM

## 2014-09-07 ENCOUNTER — Encounter (HOSPITAL_COMMUNITY): Payer: Self-pay

## 2014-09-07 ENCOUNTER — Inpatient Hospital Stay (HOSPITAL_COMMUNITY)
Admission: AD | Admit: 2014-09-07 | Discharge: 2014-09-09 | DRG: 777 | Disposition: A | Discharge status: Home / Self Care | Payer: Medicaid Other | Attending: Obstetrics and Gynecology | Admitting: Obstetrics and Gynecology

## 2014-09-07 DIAGNOSIS — K661 Hemoperitoneum: Secondary | ICD-10-CM | POA: Diagnosis present

## 2014-09-07 DIAGNOSIS — F17210 Nicotine dependence, cigarettes, uncomplicated: Secondary | ICD-10-CM | POA: Diagnosis present

## 2014-09-07 DIAGNOSIS — O009 Unspecified ectopic pregnancy without intrauterine pregnancy: Secondary | ICD-10-CM

## 2014-09-07 DIAGNOSIS — O001 Tubal pregnancy: Principal | ICD-10-CM | POA: Diagnosis present

## 2014-09-07 DIAGNOSIS — O00101 Right tubal pregnancy without intrauterine pregnancy: Secondary | ICD-10-CM

## 2014-09-07 HISTORY — DX: Anxiety disorder, unspecified: F41.9

## 2014-09-07 LAB — COMPREHENSIVE METABOLIC PANEL
ALBUMIN: 3.4 g/dL — AB (ref 3.5–5.0)
ALK PHOS: 37 U/L — AB (ref 38–126)
ALT: 14 U/L (ref 14–54)
AST: 19 U/L (ref 15–41)
Anion gap: 5 (ref 5–15)
BUN: 14 mg/dL (ref 6–20)
CALCIUM: 8.4 mg/dL — AB (ref 8.9–10.3)
CHLORIDE: 108 mmol/L (ref 101–111)
CO2: 25 mmol/L (ref 22–32)
CREATININE: 1.48 mg/dL — AB (ref 0.44–1.00)
GFR calc non Af Amer: 46 mL/min — ABNORMAL LOW (ref >60)
GFR, EST AFRICAN AMERICAN: 53 mL/min — AB (ref >60)
GLUCOSE: 163 mg/dL — AB (ref 65–99)
Potassium: 3.4 mmol/L — ABNORMAL LOW (ref 3.5–5.1)
SODIUM: 138 mmol/L (ref 135–145)
Total Bilirubin: 0.3 mg/dL (ref 0.3–1.2)
Total Protein: 5.7 g/dL — ABNORMAL LOW (ref 6.5–8.1)

## 2014-09-07 LAB — URINALYSIS, ROUTINE W REFLEX MICROSCOPIC
Bilirubin Urine: NEGATIVE
GLUCOSE, UA: NEGATIVE mg/dL
HGB URINE DIPSTICK: NEGATIVE
KETONES UR: NEGATIVE mg/dL
Leukocytes, UA: NEGATIVE
Nitrite: NEGATIVE
PH: 6 (ref 5.0–8.0)
PROTEIN: NEGATIVE mg/dL
Specific Gravity, Urine: 1.03 (ref 1.005–1.030)
Urobilinogen, UA: 0.2 mg/dL (ref 0.0–1.0)

## 2014-09-07 LAB — CBC
HCT: 32.9 % — ABNORMAL LOW (ref 36.0–46.0)
Hemoglobin: 10.7 g/dL — ABNORMAL LOW (ref 12.0–15.0)
MCH: 29.2 pg (ref 26.0–34.0)
MCHC: 32.5 g/dL (ref 30.0–36.0)
MCV: 89.9 fL (ref 78.0–100.0)
PLATELETS: 210 10*3/uL (ref 150–400)
RBC: 3.66 MIL/uL — ABNORMAL LOW (ref 3.87–5.11)
RDW: 13.4 % (ref 11.5–15.5)
WBC: 5.8 10*3/uL (ref 4.0–10.5)

## 2014-09-07 LAB — I-STAT BETA HCG BLOOD, ED (MC, WL, AP ONLY): I-stat hCG, quantitative: 1646 m[IU]/mL — ABNORMAL HIGH (ref <5)

## 2014-09-07 LAB — POC URINE PREG, ED: Preg Test, Ur: POSITIVE — AB

## 2014-09-07 LAB — LIPASE, BLOOD: Lipase: 32 U/L (ref 22–51)

## 2014-09-07 MED ORDER — SODIUM CHLORIDE 0.9 % IV SOLN
Freq: Once | INTRAVENOUS | Status: AC
  Administered 2014-09-08: 01:00:00 via INTRAVENOUS

## 2014-09-07 MED ORDER — ONDANSETRON HCL 4 MG/2ML IJ SOLN
4.0000 mg | Freq: Once | INTRAMUSCULAR | Status: AC
  Administered 2014-09-07: 4 mg via INTRAVENOUS
  Filled 2014-09-07: qty 2

## 2014-09-07 MED ORDER — FENTANYL CITRATE (PF) 100 MCG/2ML IJ SOLN
50.0000 ug | Freq: Once | INTRAMUSCULAR | Status: AC
  Administered 2014-09-07: 50 ug via INTRAVENOUS
  Filled 2014-09-07: qty 2

## 2014-09-07 NOTE — ED Notes (Signed)
Spoke with OB about patient's pain. Stated, "I do not want to drop her pressure. We will deal with it when we get there." [facility] notified.

## 2014-09-07 NOTE — ED Notes (Signed)
OB at the bedside

## 2014-09-07 NOTE — ED Provider Notes (Addendum)
CSN: [id number]     Arrival date & time 09/07/14  2128 History   First MD Initiated Contact with Patient 09/07/14 2142     Chief Complaint  Patient presents with  . Abdominal Pain     (Consider location/radiation/quality/duration/timing/severity/associated sxs/prior Treatment) Patient is a 34 y.o. female presenting with abdominal pain.  Abdominal Pain Associated symptoms: nausea and vaginal discharge    Complains of sudden onset abdominal pain lasting 1.5 hours ago. Coming by nausea. Pain started a low abdomen has radiated upper abdomen. Patient felt lightheaded "as if she were going to pass out. She was treated with fentanyl by EMS with partial relief. Pain initially started as crampy. Now sharp. No other associated symptoms. No fever. Denies urinary symptoms. Last normal menstrual period 26 days ago Past Medical History  Diagnosis Date  . Post partum depression   . Abortion, therapeutic 12/01  . Chlamydia infection     repeated infections  . Anxiety    Past Surgical History  Procedure Laterality Date  . Colposcopy      no bx, pregnant  . Cesarean section      2006  . Breast enhancement surgery Bilateral 04/26/2012   Family History  Problem Relation Age of Onset  . Hypertension Maternal Grandmother   . Depression Mother   . Anxiety disorder Father   . Post-traumatic stress disorder Father   . Cancer Maternal Aunt     Cervical CA   Social History  Substance Use Topics  . Smoking status: Current Every Day Smoker -- 0.30 packs/day    Types: Cigarettes    Last Attempt to Quit: 02/26/2012  . Smokeless tobacco: Never Used     Comment: used chantix, c/o body aches  . Alcohol Use: No   OB History    No data available     Review of Systems  Constitutional: Negative.   HENT: Negative.   Respiratory: Negative.   Cardiovascular: Negative.   Gastrointestinal: Positive for nausea and abdominal pain.  Genitourinary: Positive for vaginal discharge.       Chronic vaginal  discharge, unchanged  Musculoskeletal: Negative.   Skin: Negative.   Neurological: Positive for light-headedness.  Psychiatric/Behavioral: Negative.   All other systems reviewed and are negative.     Allergies  Review of patient's allergies indicates no known allergies.  Home Medications   Prior to Admission medications   Medication Sig Start Date End Date Taking? Authorizing Provider  ALPRAZolam Prudy Feeler) 0.5 MG tablet Take 1 tablet (0.5 mg total) by mouth at bedtime as needed for sleep. 05/25/14   Abram Sander, MD  amphetamine-dextroamphetamine (ADDERALL) 20 MG tablet Take 1 tablet (20 mg total) by mouth 2 (two) times daily. 08/07/14   Abram Sander, MD  traZODone (DESYREL) 50 MG tablet Take 0.5-1 tablets (25-50 mg total) by mouth at bedtime as needed for sleep. 01/03/13   Lonia Skinner, MD   BP 121/78 mmHg  Pulse 70  Temp(Src) 97.7 F (36.5 C) (Oral)  Resp 18  Ht 5\' 2"  (1.575 m)  Wt 125 lb (56.7 kg)  BMI 22.86 kg/m2  SpO2 100%  LMP 08/12/2014 Physical Exam  Constitutional: She appears well-developed and well-nourished.  HENT:  Head: Normocephalic and atraumatic.  Eyes: Conjunctivae are normal. Pupils are equal, round, and reactive to light.  Neck: Neck supple. No tracheal deviation present. No thyromegaly present.  Cardiovascular: Normal rate and regular rhythm.   No murmur heard. Pulmonary/Chest: Effort normal and breath sounds normal.  Abdominal: Soft. Bowel sounds  are normal. She exhibits no distension. There is tenderness.  Diffusely tender.  Genitourinary: Vaginal discharge found.  No external lesion. Cervical os closed. White vaginal discharge. No cervical motion tenderness. Uterine fundus tenderness. Bilateral adnexal tenderness  Musculoskeletal: Normal range of motion. She exhibits no edema or tenderness.  Neurological: She is alert. Coordination normal.  Skin: Skin is warm and dry. No rash noted.  Psychiatric: She has a normal mood and affect.  Nursing note  and vitals reviewed.   ED Course  Procedures (including critical care time) Labs Review Labs Reviewed  LIPASE, BLOOD  COMPREHENSIVE METABOLIC PANEL  CBC  URINALYSIS, ROUTINE W REFLEX MICROSCOPIC (NOT AT [facility])  HCG, QUANTITATIVE, PREGNANCY    Imaging Review No results found. I have personally reviewed and evaluated these images and lab results as part of my medical decision-making.   EKG Interpretation None     10:58 PM patient's pain became suddenly worse and she had near syncopal event. On reexam she looks pale, she is hypotensive. Results for orders placed or performed during the hospital encounter of 09/07/14  Lipase, blood  Result Value Ref Range   Lipase 32 22 - 51 U/L  Comprehensive metabolic panel  Result Value Ref Range   Sodium 138 135 - 145 mmol/L   Potassium 3.4 (L) 3.5 - 5.1 mmol/L   Chloride 108 101 - 111 mmol/L   CO2 25 22 - 32 mmol/L   Glucose, Bld 163 (H) 65 - 99 mg/dL   BUN 14 6 - 20 mg/dL   Creatinine, Ser 0.96 (H) 0.44 - 1.00 mg/dL   Calcium 8.4 (L) 8.9 - 10.3 mg/dL   Total Protein 5.7 (L) 6.5 - 8.1 g/dL   Albumin 3.4 (L) 3.5 - 5.0 g/dL   AST 19 15 - 41 U/L   ALT 14 14 - 54 U/L   Alkaline Phosphatase 37 (L) 38 - 126 U/L   Total Bilirubin 0.3 0.3 - 1.2 mg/dL   GFR calc non Af Amer 46 (L) >60 mL/min   GFR calc Af Amer 53 (L) >60 mL/min   Anion gap 5 5 - 15  CBC  Result Value Ref Range   WBC 5.8 4.0 - 10.5 K/uL   RBC 3.66 (L) 3.87 - 5.11 MIL/uL   Hemoglobin 10.7 (L) 12.0 - 15.0 g/dL   HCT 04.5 (L) 40.9 - 81.1 %   MCV 89.9 78.0 - 100.0 fL   MCH 29.2 26.0 - 34.0 pg   MCHC 32.5 30.0 - 36.0 g/dL   RDW 91.4 78.2 - 95.6 %   Platelets 210 150 - 400 K/uL  Urinalysis, Routine w reflex microscopic (not at [facility])  Result Value Ref Range   Color, Urine YELLOW YELLOW   APPearance CLEAR CLEAR   Specific Gravity, Urine 1.030 1.005 - 1.030   pH 6.0 5.0 - 8.0   Glucose, UA NEGATIVE NEGATIVE mg/dL   Hgb urine dipstick NEGATIVE NEGATIVE   Bilirubin Urine  NEGATIVE NEGATIVE   Ketones, ur NEGATIVE NEGATIVE mg/dL   Protein, ur NEGATIVE NEGATIVE mg/dL   Urobilinogen, UA 0.2 0.0 - 1.0 mg/dL   Nitrite NEGATIVE NEGATIVE   Leukocytes, UA NEGATIVE NEGATIVE  POC urine preg, ED (not at [facility])  Result Value Ref Range   Preg Test, Ur POSITIVE (A) NEGATIVE  I-Stat Beta hCG blood, ED ([facility], [facility], [facility] only)  Result Value Ref Range   I-stat hCG, quantitative 1646.0 (H) <5 mIU/mL   Comment 3  No results found. 2340 pt alert gcs 15  bp improved after treatment with ivfluids,  MDM  Strongly suspect ruptured ectopic pregnancy. I spoke with Dr. Emelda Fear who requests bedside ultrasound. Ordered by me Patient typed and crossmatched . Dr. Emelda Fear was consult Me he came to the emergency department and made arrangements for transfer to [facility] for Final diagnoses:  None  Dx#1 abdominal pain #2 near syncope #3 hypotension #4 pregnancy #5 anemia #6 hypokalemia  CRITICAL CARE Performed by: Doug Sou Total critical care time: 40 minute Critical care time was exclusive of separately billable procedures and treating other patients. Critical care was necessary to treat or prevent imminent or life-threatening deterioration. Critical care was time spent personally by me on the following activities: development of treatment plan with patient and/or surrogate as well as nursing, discussions with consultants, evaluation of patient's response to treatment, examination of patient, obtaining history from patient or surrogate, ordering and performing treatments and interventions, ordering and review of laboratory studies, ordering and review of radiographic studies, pulse oximetry and re-evaluation of patient's condition.   Doug Sou, MD 09/07/14 2356  Doug Sou, MD 09/07/14 [phone]

## 2014-09-07 NOTE — ED Notes (Signed)
Per PTAR, Patient was at the football game and started to have extremely abdominal pain in her lower abdomen. Patient reports the pain "feeling like bad gas." When pateint arrived to the ED, patient was diaphoretic, pale, and reports feeling like she was going to pass out. Patient was placed in Trendelenburg, placed on oxygen and report some relief. Patient is alert and oriented x4. Patient reports pain decreasing. Vitals per PTAR: 138/80, 84 HR, 99% on RA.

## 2014-09-07 NOTE — Consult Note (Signed)
Reason for Consult rule out ectopic Referring Physician: Jacubowitz  Janet Hughes is an 34 y.o. female. She is seen in room E 48 for acute onset abdominal pain. She had onset at 8 pm, and presented to the ED . She had lower abd pain , and LMP was last month . Last sex was "3 months ago" No  Contraception. She was initially sycopal; at 11:00 and has responded to IV fluids by 2 iv lines. She is being transferred for care, now that VS have improved.  Pertinent Gynecological History: Menses: regular Bleeding:  Contraception: none DES exposure: unknown Blood transfusions: none Sexually transmitted diseases: no past history Previous GYN Procedures:   Last mammogram:  Date:  Last pap:  Date:  OB History: G, P   Menstrual History: Menarche age:  Patient's last menstrual period was 08/12/2014.    Past Medical History  Diagnosis Date  . Post partum depression   . Abortion, therapeutic 12/01  . Chlamydia infection     repeated infections  . Anxiety     Past Surgical History  Procedure Laterality Date  . Colposcopy      no bx, pregnant  . Cesarean section      2006  . Breast enhancement surgery Bilateral 04/26/2012    Family History  Problem Relation Age of Onset  . Hypertension Maternal Grandmother   . Depression Mother   . Anxiety disorder Father   . Post-traumatic stress disorder Father   . Cancer Maternal Aunt     Cervical CA    Social History:  reports that she has been smoking Cigarettes.  She has been smoking about 0.30 packs per day. She has never used smokeless tobacco. She reports that she does not drink alcohol or use illicit drugs.  Allergies: No Known Allergies  Medications: I have reviewed the patient's current medications.  ROS  Blood pressure 127/55, pulse 95, temperature 98.2 F (36.8 C), temperature source Oral, resp. rate 23, height 5' 2" (1.575 m), weight 56.7 kg (125 lb), last menstrual period 08/12/2014, SpO2 100 %. Physical Exam   Constitutional: She appears well-developed and well-nourished.  HENT:  Head: Normocephalic and atraumatic.  Eyes: Pupils are equal, round, and reactive to light.  Neck: Normal range of motion.  Cardiovascular: Regular rhythm.   .current pulse 93  Respiratory: Effort normal.  GI: There is tenderness.  Suprapubic tenderness bilateral  Skin: She is diaphoretic.    Results for orders placed or performed during the hospital encounter of 09/07/14 (from the past 48 hour(s))  Lipase, blood     Status: None   Collection Time: 09/07/14  9:45 PM  Result Value Ref Range   Lipase 32 22 - 51 U/L  Comprehensive metabolic panel     Status: Abnormal   Collection Time: 09/07/14  9:45 PM  Result Value Ref Range   Sodium 138 135 - 145 mmol/L   Potassium 3.4 (L) 3.5 - 5.1 mmol/L   Chloride 108 101 - 111 mmol/L   CO2 25 22 - 32 mmol/L   Glucose, Bld 163 (H) 65 - 99 mg/dL   BUN 14 6 - 20 mg/dL   Creatinine, Ser 1.48 (H) 0.44 - 1.00 mg/dL   Calcium 8.4 (L) 8.9 - 10.3 mg/dL   Total Protein 5.7 (L) 6.5 - 8.1 g/dL   Albumin 3.4 (L) 3.5 - 5.0 g/dL   AST 19 15 - 41 U/L   ALT 14 14 - 54 U/L   Alkaline Phosphatase 37 (L) 38 - 126   U/L   Total Bilirubin 0.3 0.3 - 1.2 mg/dL   GFR calc non Af Amer 46 (L) >60 mL/min   GFR calc Af Amer 53 (L) >60 mL/min    Comment: (NOTE) The eGFR has been calculated using the CKD EPI equation. This calculation has not been validated in all clinical situations. eGFR's persistently <60 mL/min signify possible Chronic Kidney Disease.    Anion gap 5 5 - 15  CBC     Status: Abnormal   Collection Time: 09/07/14  9:45 PM  Result Value Ref Range   WBC 5.8 4.0 - 10.5 K/uL   RBC 3.66 (L) 3.87 - 5.11 MIL/uL   Hemoglobin 10.7 (L) 12.0 - 15.0 g/dL   HCT 32.9 (L) 36.0 - 46.0 %   MCV 89.9 78.0 - 100.0 fL   MCH 29.2 26.0 - 34.0 pg   MCHC 32.5 30.0 - 36.0 g/dL   RDW 13.4 11.5 - 15.5 %   Platelets 210 150 - 400 K/uL  Urinalysis, Routine w reflex microscopic (not at [facility])      Status: None   Collection Time: 09/07/14 10:28 PM  Result Value Ref Range   Color, Urine YELLOW YELLOW   APPearance CLEAR CLEAR   Specific Gravity, Urine 1.030 1.005 - 1.030   pH 6.0 5.0 - 8.0   Glucose, UA NEGATIVE NEGATIVE mg/dL   Hgb urine dipstick NEGATIVE NEGATIVE   Bilirubin Urine NEGATIVE NEGATIVE   Ketones, ur NEGATIVE NEGATIVE mg/dL   Protein, ur NEGATIVE NEGATIVE mg/dL   Urobilinogen, UA 0.2 0.0 - 1.0 mg/dL   Nitrite NEGATIVE NEGATIVE   Leukocytes, UA NEGATIVE NEGATIVE    Comment: MICROSCOPIC NOT DONE ON URINES WITH NEGATIVE PROTEIN, BLOOD, LEUKOCYTES, NITRITE, OR GLUCOSE <1000 mg/dL.  POC urine preg, ED (not at [facility])     Status: Abnormal   Collection Time: 09/07/14 10:36 PM  Result Value Ref Range   Preg Test, Ur POSITIVE (A) NEGATIVE    Comment:        THE SENSITIVITY OF THIS METHODOLOGY IS >24 mIU/mL     No results found.  Assessment/Plan: Pregnancy unknown location' Syncope Suspected ectopic until ruled out  [facility], [facility] here.   Janet Hughes 09/07/2014

## 2014-09-07 NOTE — ED Notes (Signed)
MD at the bedside  

## 2014-09-07 NOTE — ED Notes (Signed)
Dr Emelda Fear OBGYN in room with Dr Shela Commons and this RN

## 2014-09-07 NOTE — ED Notes (Signed)
Pt called out and stated that she was nauseous, this RN into room, sat patient up to 45 degrees and 15 seconds later the pt had a syncopal episode and BP dropped to 75/38, pt placed in reverse trendlenberg and 1 Liter of fluid given, Dr Nunzio Cory in room with this RN and Edwina Barth. Pt began to come around after a couple minutes.

## 2014-09-08 ENCOUNTER — Inpatient Hospital Stay (HOSPITAL_COMMUNITY): Payer: Medicaid Other | Admitting: Anesthesiology

## 2014-09-08 ENCOUNTER — Encounter (HOSPITAL_COMMUNITY)
Admission: AD | Disposition: A | Discharge status: Home / Self Care | Payer: Self-pay | Source: Home / Self Care | Attending: Obstetrics and Gynecology

## 2014-09-08 ENCOUNTER — Encounter (HOSPITAL_COMMUNITY): Payer: Self-pay | Admitting: Anesthesiology

## 2014-09-08 ENCOUNTER — Inpatient Hospital Stay (HOSPITAL_COMMUNITY): Payer: Medicaid Other

## 2014-09-08 DIAGNOSIS — O009 Unspecified ectopic pregnancy without intrauterine pregnancy: Secondary | ICD-10-CM

## 2014-09-08 DIAGNOSIS — O00101 Right tubal pregnancy without intrauterine pregnancy: Secondary | ICD-10-CM

## 2014-09-08 DIAGNOSIS — F17210 Nicotine dependence, cigarettes, uncomplicated: Secondary | ICD-10-CM | POA: Diagnosis present

## 2014-09-08 DIAGNOSIS — K661 Hemoperitoneum: Secondary | ICD-10-CM | POA: Diagnosis present

## 2014-09-08 DIAGNOSIS — O001 Tubal pregnancy: Secondary | ICD-10-CM | POA: Diagnosis present

## 2014-09-08 HISTORY — PX: LAPAROTOMY: SHX154

## 2014-09-08 HISTORY — DX: Unspecified ectopic pregnancy without intrauterine pregnancy: O00.90

## 2014-09-08 LAB — CBC
HCT: 18.9 % — ABNORMAL LOW (ref 36.0–46.0)
HCT: 33.6 % — ABNORMAL LOW (ref 36.0–46.0)
HEMOGLOBIN: 11.4 g/dL — AB (ref 12.0–15.0)
Hemoglobin: 6.4 g/dL — CL (ref 12.0–15.0)
MCH: 30.3 pg (ref 26.0–34.0)
MCH: 31.1 pg (ref 26.0–34.0)
MCHC: 33.9 g/dL (ref 30.0–36.0)
MCHC: 33.9 g/dL (ref 30.0–36.0)
MCV: 89.4 fL (ref 78.0–100.0)
MCV: 91.7 fL (ref 78.0–100.0)
PLATELETS: 99 10*3/uL — AB (ref 150–400)
Platelets: 156 10*3/uL (ref 150–400)
RBC: 2.06 MIL/uL — ABNORMAL LOW (ref 3.87–5.11)
RBC: 3.76 MIL/uL — ABNORMAL LOW (ref 3.87–5.11)
RDW: 13.7 % (ref 11.5–15.5)
RDW: 13.8 % (ref 11.5–15.5)
WBC: 12.7 10*3/uL — ABNORMAL HIGH (ref 4.0–10.5)
WBC: 14.6 10*3/uL — ABNORMAL HIGH (ref 4.0–10.5)

## 2014-09-08 LAB — APTT: aPTT: 27 seconds (ref 24–37)

## 2014-09-08 LAB — PREPARE RBC (CROSSMATCH)

## 2014-09-08 LAB — HEMOGLOBIN AND HEMATOCRIT, BLOOD
HCT: 20.9 % — ABNORMAL LOW (ref 36.0–46.0)
Hemoglobin: 7 g/dL — ABNORMAL LOW (ref 12.0–15.0)

## 2014-09-08 LAB — BASIC METABOLIC PANEL
Anion gap: 0 — ABNORMAL LOW (ref 5–15)
BUN: 11 mg/dL (ref 6–20)
CALCIUM: 6.6 mg/dL — AB (ref 8.9–10.3)
CO2: 20 mmol/L — ABNORMAL LOW (ref 22–32)
CREATININE: 0.75 mg/dL (ref 0.44–1.00)
Chloride: 117 mmol/L — ABNORMAL HIGH (ref 101–111)
Glucose, Bld: 143 mg/dL — ABNORMAL HIGH (ref 65–99)
Potassium: 4 mmol/L (ref 3.5–5.1)
SODIUM: 137 mmol/L (ref 135–145)

## 2014-09-08 LAB — DIC (DISSEMINATED INTRAVASCULAR COAGULATION)PANEL
Fibrinogen: 145 mg/dL — ABNORMAL LOW (ref 204–475)
Prothrombin Time: 17.8 seconds — ABNORMAL HIGH (ref 11.6–15.2)
Smear Review: NONE SEEN

## 2014-09-08 LAB — DIC (DISSEMINATED INTRAVASCULAR COAGULATION) PANEL
APTT: 27 s (ref 24–37)
D DIMER QUANT: 0.35 ug{FEU}/mL (ref 0.00–0.48)
INR: 1.46 (ref 0.00–1.49)
PLATELETS: 111 10*3/uL — AB (ref 150–400)

## 2014-09-08 LAB — GLUCOSE, CAPILLARY: GLUCOSE-CAPILLARY: 118 mg/dL — AB (ref 65–99)

## 2014-09-08 LAB — PROTIME-INR
INR: 1.53 — ABNORMAL HIGH (ref 0.00–1.49)
PROTHROMBIN TIME: 18.5 s — AB (ref 11.6–15.2)

## 2014-09-08 LAB — HCG, QUANTITATIVE, PREGNANCY: hCG, Beta Chain, Quant, S: 1761 m[IU]/mL — ABNORMAL HIGH (ref <5)

## 2014-09-08 LAB — ABO/RH: ABO/RH(D): O POS

## 2014-09-08 LAB — MASSIVE TRANSFUSION PROTOCOL ORDER (BLOOD BANK NOTIFICATION)

## 2014-09-08 SURGERY — LAPAROTOMY, EXPLORATORY
Anesthesia: General | Site: Abdomen

## 2014-09-08 MED ORDER — PROPOFOL 10 MG/ML IV BOLUS
INTRAVENOUS | Status: AC
  Filled 2014-09-08: qty 20

## 2014-09-08 MED ORDER — KETOROLAC TROMETHAMINE 30 MG/ML IJ SOLN
30.0000 mg | Freq: Four times a day (QID) | INTRAMUSCULAR | Status: DC
  Administered 2014-09-08 – 2014-09-09 (×4): 30 mg via INTRAVENOUS
  Filled 2014-09-08 (×4): qty 1

## 2014-09-08 MED ORDER — FENTANYL CITRATE (PF) 100 MCG/2ML IJ SOLN
100.0000 ug | INTRAMUSCULAR | Status: AC | PRN
  Administered 2014-09-08 (×2): 100 ug via INTRAVENOUS

## 2014-09-08 MED ORDER — NEOSTIGMINE METHYLSULFATE 10 MG/10ML IV SOLN
INTRAVENOUS | Status: DC | PRN
Start: 2014-09-08 — End: 2014-09-08
  Administered 2014-09-08: 1 mg via INTRAVENOUS
  Administered 2014-09-08: 3 mg via INTRAVENOUS

## 2014-09-08 MED ORDER — ONDANSETRON HCL 4 MG PO TABS: 4.0000 mg | ORAL_TABLET | Freq: Four times a day (QID) | ORAL | Status: DC | PRN

## 2014-09-08 MED ORDER — LIDOCAINE HCL (CARDIAC) 20 MG/ML IV SOLN
INTRAVENOUS | Status: DC | PRN
  Administered 2014-09-08: 100 mg via INTRAVENOUS

## 2014-09-08 MED ORDER — LACTATED RINGERS IV SOLN: INTRAVENOUS | Status: DC

## 2014-09-08 MED ORDER — KETOROLAC TROMETHAMINE 30 MG/ML IJ SOLN: 30.0000 mg | Freq: Once | INTRAMUSCULAR | Status: DC

## 2014-09-08 MED ORDER — FENTANYL CITRATE (PF) 100 MCG/2ML IJ SOLN
INTRAMUSCULAR | Status: AC
  Filled 2014-09-08: qty 4

## 2014-09-08 MED ORDER — ONDANSETRON HCL 4 MG/2ML IJ SOLN: 4.0000 mg | Freq: Four times a day (QID) | INTRAMUSCULAR | Status: DC | PRN

## 2014-09-08 MED ORDER — ROCURONIUM BROMIDE 100 MG/10ML IV SOLN
INTRAVENOUS | Status: AC
  Filled 2014-09-08: qty 1

## 2014-09-08 MED ORDER — FAMOTIDINE IN NACL 20-0.9 MG/50ML-% IV SOLN
20.0000 mg | Freq: Once | INTRAVENOUS | Status: AC
  Administered 2014-09-08: 20 mg via INTRAVENOUS
  Filled 2014-09-08: qty 50

## 2014-09-08 MED ORDER — HYDROMORPHONE HCL 1 MG/ML IJ SOLN
INTRAMUSCULAR | Status: AC
  Filled 2014-09-08: qty 1

## 2014-09-08 MED ORDER — KETOROLAC TROMETHAMINE 30 MG/ML IJ SOLN: 30.0000 mg | Freq: Four times a day (QID) | INTRAMUSCULAR | Status: DC

## 2014-09-08 MED ORDER — ROCURONIUM BROMIDE 100 MG/10ML IV SOLN
INTRAVENOUS | Status: DC | PRN
  Administered 2014-09-08: 50 mg via INTRAVENOUS

## 2014-09-08 MED ORDER — PANTOPRAZOLE SODIUM 40 MG PO TBEC
40.0000 mg | DELAYED_RELEASE_TABLET | Freq: Every day | ORAL | Status: DC
  Administered 2014-09-09: 40 mg via ORAL
  Filled 2014-09-08: qty 1

## 2014-09-08 MED ORDER — MIDAZOLAM HCL 2 MG/2ML IJ SOLN
INTRAMUSCULAR | Status: AC
  Filled 2014-09-08: qty 4

## 2014-09-08 MED ORDER — IBUPROFEN 600 MG PO TABS: 600.0000 mg | ORAL_TABLET | Freq: Four times a day (QID) | ORAL | Status: DC | PRN

## 2014-09-08 MED ORDER — CEFAZOLIN SODIUM-DEXTROSE 2-3 GM-% IV SOLR
INTRAVENOUS | Status: DC | PRN
  Administered 2014-09-08: 2 g via INTRAVENOUS

## 2014-09-08 MED ORDER — CITRIC ACID-SODIUM CITRATE 334-500 MG/5ML PO SOLN
ORAL | Status: AC
  Filled 2014-09-08: qty 15

## 2014-09-08 MED ORDER — SUCCINYLCHOLINE CHLORIDE 20 MG/ML IJ SOLN
INTRAMUSCULAR | Status: DC | PRN
  Administered 2014-09-08: 140 mg via INTRAVENOUS

## 2014-09-08 MED ORDER — PROPOFOL 10 MG/ML IV BOLUS
INTRAVENOUS | Status: DC | PRN
  Administered 2014-09-08: 170 mg via INTRAVENOUS

## 2014-09-08 MED ORDER — MIDAZOLAM HCL 2 MG/2ML IJ SOLN
INTRAMUSCULAR | Status: DC | PRN
  Administered 2014-09-08: 2 mg via INTRAVENOUS

## 2014-09-08 MED ORDER — FENTANYL CITRATE (PF) 250 MCG/5ML IJ SOLN
INTRAMUSCULAR | Status: AC
  Filled 2014-09-08: qty 25

## 2014-09-08 MED ORDER — SODIUM CHLORIDE 0.9 % IV SOLN: Freq: Once | INTRAVENOUS | Status: DC

## 2014-09-08 MED ORDER — ALBUMIN HUMAN 5 % IV SOLN
INTRAVENOUS | Status: DC | PRN
  Administered 2014-09-08 (×2): via INTRAVENOUS

## 2014-09-08 MED ORDER — ONDANSETRON HCL 4 MG/2ML IJ SOLN
INTRAMUSCULAR | Status: AC
  Filled 2014-09-08: qty 2

## 2014-09-08 MED ORDER — FENTANYL CITRATE (PF) 100 MCG/2ML IJ SOLN
INTRAMUSCULAR | Status: DC | PRN
  Administered 2014-09-08: 50 ug via INTRAVENOUS
  Administered 2014-09-08: 25 ug via INTRAVENOUS
  Administered 2014-09-08 (×2): 50 ug via INTRAVENOUS
  Administered 2014-09-08: 25 ug via INTRAVENOUS
  Administered 2014-09-08: 100 ug via INTRAVENOUS
  Administered 2014-09-08 (×2): 25 ug via INTRAVENOUS

## 2014-09-08 MED ORDER — GLYCOPYRROLATE 0.2 MG/ML IJ SOLN
INTRAMUSCULAR | Status: AC
  Filled 2014-09-08: qty 3

## 2014-09-08 MED ORDER — DEXAMETHASONE SODIUM PHOSPHATE 4 MG/ML IJ SOLN
INTRAMUSCULAR | Status: DC | PRN
  Administered 2014-09-08: 4 mg via INTRAVENOUS

## 2014-09-08 MED ORDER — PHENYLEPHRINE 8 MG IN D5W 100 ML (0.08MG/ML) PREMIX OPTIME
INJECTION | INTRAVENOUS | Status: DC | PRN
  Administered 2014-09-08: 60 ug/min via INTRAVENOUS

## 2014-09-08 MED ORDER — ONDANSETRON HCL 4 MG/2ML IJ SOLN
INTRAMUSCULAR | Status: DC | PRN
  Administered 2014-09-08: 4 mg via INTRAVENOUS

## 2014-09-08 MED ORDER — SODIUM CHLORIDE 0.9 % IV SOLN
INTRAVENOUS | Status: DC | PRN
  Administered 2014-09-08 (×5): via INTRAVENOUS

## 2014-09-08 MED ORDER — PHENYLEPHRINE HCL 10 MG/ML IJ SOLN
INTRAMUSCULAR | Status: DC | PRN
  Administered 2014-09-08 (×6): 80 ug via INTRAVENOUS

## 2014-09-08 MED ORDER — SCOPOLAMINE 1 MG/3DAYS TD PT72
MEDICATED_PATCH | TRANSDERMAL | Status: AC
  Administered 2014-09-08: 1.5 mg
  Filled 2014-09-08: qty 1

## 2014-09-08 MED ORDER — CITRIC ACID-SODIUM CITRATE 334-500 MG/5ML PO SOLN
30.0000 mL | Freq: Once | ORAL | Status: AC
  Administered 2014-09-08: 30 mL via ORAL

## 2014-09-08 MED ORDER — SODIUM CHLORIDE 0.9 % IV SOLN: INTRAVENOUS | Status: DC

## 2014-09-08 MED ORDER — PHENYLEPHRINE 8 MG IN D5W 100 ML (0.08MG/ML) PREMIX OPTIME
INJECTION | INTRAVENOUS | Status: AC
  Filled 2014-09-08: qty 100

## 2014-09-08 MED ORDER — KETOROLAC TROMETHAMINE 30 MG/ML IJ SOLN
INTRAMUSCULAR | Status: AC
  Filled 2014-09-08: qty 1

## 2014-09-08 MED ORDER — DEXAMETHASONE SODIUM PHOSPHATE 4 MG/ML IJ SOLN
INTRAMUSCULAR | Status: AC
  Filled 2014-09-08: qty 1

## 2014-09-08 MED ORDER — OXYCODONE-ACETAMINOPHEN 5-325 MG PO TABS
1.0000 | ORAL_TABLET | ORAL | Status: DC | PRN
  Administered 2014-09-08 – 2014-09-09 (×2): 1 via ORAL
  Filled 2014-09-08 (×2): qty 1

## 2014-09-08 MED ORDER — GLYCOPYRROLATE 0.2 MG/ML IJ SOLN
INTRAMUSCULAR | Status: DC | PRN
  Administered 2014-09-08: 0.2 mg via INTRAVENOUS
  Administered 2014-09-08: 0.6 mg via INTRAVENOUS

## 2014-09-08 MED ORDER — HYDROMORPHONE HCL 1 MG/ML IJ SOLN
1.0000 mg | INTRAMUSCULAR | Status: DC | PRN
  Administered 2014-09-08 (×2): 1 mg via INTRAVENOUS
  Filled 2014-09-08: qty 1

## 2014-09-08 MED ORDER — LIDOCAINE HCL (CARDIAC) 20 MG/ML IV SOLN
INTRAVENOUS | Status: AC
  Filled 2014-09-08: qty 5

## 2014-09-08 MED ORDER — NEOSTIGMINE METHYLSULFATE 10 MG/10ML IV SOLN
INTRAVENOUS | Status: AC
  Filled 2014-09-08: qty 1

## 2014-09-08 SURGICAL SUPPLY — 40 items
BLADE SURG 10 STRL SS (BLADE) ×6 IMPLANT
CANISTER SUCT 3000ML (MISCELLANEOUS) ×6 IMPLANT
CELLS DAT CNTRL 66122 CELL SVR (MISCELLANEOUS) ×1 IMPLANT
CLOTH BEACON ORANGE TIMEOUT ST (SAFETY) IMPLANT
CONT PATH 16OZ SNAP LID 3702 (MISCELLANEOUS) ×3 IMPLANT
DRAPE WARM FLUID 44X44 (DRAPE) IMPLANT
DRSG OPSITE POSTOP 4X10 (GAUZE/BANDAGES/DRESSINGS) ×3 IMPLANT
GAUZE SPONGE 4X4 16PLY XRAY LF (GAUZE/BANDAGES/DRESSINGS) ×3 IMPLANT
GLOVE BIOGEL PI IND STRL 9 (GLOVE) ×2 IMPLANT
GLOVE BIOGEL PI INDICATOR 9 (GLOVE) ×4
GLOVE ECLIPSE 9.0 STRL (GLOVE) ×3 IMPLANT
GOWN STRL REUS W/TWL 2XL LVL3 (GOWN DISPOSABLE) ×3 IMPLANT
GOWN STRL REUS W/TWL LRG LVL3 (GOWN DISPOSABLE) ×3 IMPLANT
NS IRRIG 1000ML POUR BTL (IV SOLUTION) ×3 IMPLANT
PACK ABDOMINAL GYN (CUSTOM PROCEDURE TRAY) ×3 IMPLANT
PAD ABD 7.5X8 STRL (GAUZE/BANDAGES/DRESSINGS) ×3 IMPLANT
PAD OB MATERNITY 4.3X12.25 (PERSONAL CARE ITEMS) ×3 IMPLANT
PENCIL SMOKE EVAC W/HOLSTER (ELECTROSURGICAL) IMPLANT
RETRACTOR WND ALEXIS 25 LRG (MISCELLANEOUS) IMPLANT
RTRCTR WOUND ALEXIS 18CM MED (MISCELLANEOUS) ×3
RTRCTR WOUND ALEXIS 25CM LRG (MISCELLANEOUS)
SPONGE GAUZE 4X4 12PLY STER LF (GAUZE/BANDAGES/DRESSINGS) ×3 IMPLANT
SPONGE LAP 18X18 X RAY DECT (DISPOSABLE) ×6 IMPLANT
STAPLER VISISTAT 35W (STAPLE) ×3 IMPLANT
SUT CHROMIC 0 CT 1 (SUTURE) IMPLANT
SUT CHROMIC 2 0 CT 1 (SUTURE) ×3 IMPLANT
SUT CHROMIC 2 0 SH (SUTURE) IMPLANT
SUT CHROMIC GUT AB #0 18 (SUTURE) ×3 IMPLANT
SUT PLAIN 2 0 (SUTURE)
SUT PLAIN ABS 2-0 CT1 27XMFL (SUTURE) IMPLANT
SUT PROLENE 0 CT 1 30 (SUTURE) IMPLANT
SUT VIC AB 0 CT1 27 (SUTURE) ×4
SUT VIC AB 0 CT1 27XBRD ANBCTR (SUTURE) ×2 IMPLANT
SUT VIC AB 2-0 CT1 27 (SUTURE)
SUT VIC AB 2-0 CT1 TAPERPNT 27 (SUTURE) IMPLANT
SYR BULB IRRIGATION 50ML (SYRINGE) ×3 IMPLANT
TAPE PAPER 2X10 WHT MICROPORE (GAUZE/BANDAGES/DRESSINGS) ×3 IMPLANT
TOWEL OR 17X24 6PK STRL BLUE (TOWEL DISPOSABLE) ×6 IMPLANT
TRAY FOLEY CATH SILVER 14FR (SET/KITS/TRAYS/PACK) ×3 IMPLANT
WATER STERILE IRR 1000ML POUR (IV SOLUTION) ×3 IMPLANT

## 2014-09-08 NOTE — Addendum Note (Signed)
Addendum  created 09/08/14 0723 by Yolonda Kida, CRNA   Modules edited: Notes Section   Notes Section:  File: [id number]

## 2014-09-08 NOTE — Anesthesia Postprocedure Evaluation (Signed)
  Anesthesia Post-op Note  Patient: Janet Hughes  Procedure(s) Performed: Procedure(s) with comments: EXPLORATORY LAPAROTOMY WITH RIGHT SALPINGECTOMY (N/A) - Ruptured Right Ectopic Pregnancy  Patient Location: [facility] Unit  Anesthesia Type:General  Level of Consciousness: awake, alert , oriented and patient cooperative  Airway and Oxygen Therapy: Patient Spontanous Breathing  Post-op Pain: mild  Post-op Assessment: Post-op Vital signs reviewed, Patient's Cardiovascular Status Stable, Respiratory Function Stable, Patent Airway and No signs of Nausea or vomiting              Post-op Vital Signs: Reviewed and stable  Last Vitals:  Filed Vitals:   09/08/14 0652  BP: 114/62  Pulse:   Temp: 37 C  Resp: 18    Complications: No apparent anesthesia complications

## 2014-09-08 NOTE — Brief Op Note (Signed)
09/07/2014 - 09/08/2014  2:05 AM  PATIENT:  Janet Hughes  34 y.o. female  PRE-OPERATIVE DIAGNOSIS:  Ruptured Ectopic Pregnancy  POST-OPERATIVE DIAGNOSIS:  Ruptured Right Ectopic Pregnancy, hemoperitoneum  PROCEDURE:  Procedure(s) with comments: EXPLORATORY LAPAROTOMY WITH RIGHT SALPINGECTOMY (N/A) - Ruptured Right Ectopic Pregnancy  SURGEON:  Surgeon(s) and Role:    * Tilda Burrow, MD - Primary  PHYSICIAN ASSISTANT:   ASSISTANTS: none   ANESTHESIA:   general  EBL:  Total I/O In: 4325 [I.V.:3800; Blood:25; IV Piggyback:500] Out: 1650 [Blood:1650]  BLOOD ADMINISTERED:4 units CC PRBC  DRAINS: Urinary Catheter (Foley)   LOCAL MEDICATIONS USED:  NONE  SPECIMEN:  Source of Specimen:  right tube with ectopic  DISPOSITION OF SPECIMEN:  PATHOLOGY  COUNTS:  YES  TOURNIQUET:  * No tourniquets in log *  DICTATION: .Dragon Dictation  PLAN OF CARE: Admit to inpatient   PATIENT DISPOSITION:  PACU - hemodynamically stable.   Delay start of Pharmacological VTE agent (>24hrs) due to surgical blood loss or risk of bleeding: not applicable

## 2014-09-08 NOTE — OR Nursing (Addendum)
Code MTP deactivated per Dr. Emelda Fear and Dr. Karie Schwalbe @ [phone]

## 2014-09-08 NOTE — MAU Note (Cosign Needed)
Received pt from  [facility]. Suspected ruptured ectopic. V/ss stable for now. Dr. Emelda Fear at bedside. Setting up for bedside u/s. Lab called for stated labs

## 2014-09-08 NOTE — MAU Provider Note (Signed)
Patient Name Sex DOB SSN    Janet Hughes, Janet Hughes Female 1980/01/26 1913    Consult Note by [name], MD at 09/07/2014 11:45 PM    Author: [name], MD Service: Obstetrics/Gynecology Author Type: Physician   Filed: 09/07/2014 11:58 PM Note Time: 09/07/2014 11:45 PM Status: Signed   Editor: [name], MD (Physician)     Expand All Collapse All   Reason for Consult rule out ectopic Referring Physician: Mckinzie Saksa is an 34 y.o. female. She is seen in room E 48 for acute onset abdominal pain. She had onset at 8 pm, and presented to the ED . She had lower abd pain , and LMP was last month . Last sex was "3 months ago" No Contraception. She was initially sycopal; at 11:00 and has responded to IV fluids by 2 iv lines. She is being transferred for care, now that VS have improved.  Pertinent Gynecological History: Menses: regular Bleeding:  Contraception: none DES exposure: unknown Blood transfusions: none Sexually transmitted diseases: no past history Previous GYN Procedures:  Last mammogram: Date:  Last pap: Date:  OB History: G, P  Menstrual History: Menarche age:  Patient's last menstrual period was 08/12/2014.    Past Medical History  Diagnosis Date  . Post partum depression   . Abortion, therapeutic 12/01  . Chlamydia infection     repeated infections  . Anxiety     Past Surgical History  Procedure Laterality Date  . Colposcopy      no bx, pregnant  . Cesarean section      2006  . Breast enhancement surgery Bilateral 04/26/2012    Family History  Problem Relation Age of Onset  . Hypertension Maternal Grandmother   . Depression Mother   . Anxiety disorder Father   . Post-traumatic stress disorder Father   . Cancer Maternal Aunt     Cervical CA    Social History:  reports that she has been smoking Cigarettes. She has been smoking  about 0.30 packs per day. She has never used smokeless tobacco. She reports that she does not drink alcohol or use illicit drugs.  Allergies: No Known Allergies  Medications: I have reviewed the patient's current medications.  ROS  Blood pressure 127/55, pulse 95, temperature 98.2 F (36.8 C), temperature source Oral, resp. rate 23, height 5' 2" (1.575 m), weight 56.7 kg (125 lb), last menstrual period 08/12/2014, SpO2 100 %. Physical Exam  Constitutional: She appears well-developed and well-nourished.  HENT:  Head: Normocephalic and atraumatic.  Eyes: Pupils are equal, round, and reactive to light.  Neck: Normal range of motion.  Cardiovascular: Regular rhythm.  .current pulse 93  Respiratory: Effort normal.  GI: There is tenderness.  Suprapubic tenderness bilateral  Skin: She is diaphoretic.     Lab Results Last 48 Hours    Results for orders placed or performed during the hospital encounter of 09/07/14 (from the past 48 hour(s))  Lipase, blood Status: None   Collection Time: 09/07/14 9:45 PM  Result Value Ref Range   Lipase 32 22 - 51 U/L  Comprehensive metabolic panel Status: Abnormal   Collection Time: 09/07/14 9:45 PM  Result Value Ref Range   Sodium 138 135 - 145 mmol/L   Potassium 3.4 (L) 3.5 - 5.1 mmol/L   Chloride 108 101 - 111 mmol/L   CO2 25 22 - 32 mmol/L   Glucose, Bld 163 (H) 65 - 99 mg/dL   BUN 14 6 - 20  mg/dL   Creatinine, Ser 1.48 (H) 0.44 - 1.00 mg/dL   Calcium 8.4 (L) 8.9 - 10.3 mg/dL   Total Protein 5.7 (L) 6.5 - 8.1 g/dL   Albumin 3.4 (L) 3.5 - 5.0 g/dL   AST 19 15 - 41 U/L   ALT 14 14 - 54 U/L   Alkaline Phosphatase 37 (L) 38 - 126 U/L   Total Bilirubin 0.3 0.3 - 1.2 mg/dL   GFR calc non Af Amer 46 (L) >60 mL/min   GFR calc Af Amer 53 (L) >60 mL/min    Comment: (NOTE) The eGFR has been calculated using the CKD EPI equation. This calculation has  not been validated in all clinical situations. eGFR's persistently <60 mL/min signify possible Chronic Kidney Disease.    Anion gap 5 5 - 15  CBC Status: Abnormal   Collection Time: 09/07/14 9:45 PM  Result Value Ref Range   WBC 5.8 4.0 - 10.5 K/uL   RBC 3.66 (L) 3.87 - 5.11 MIL/uL   Hemoglobin 10.7 (L) 12.0 - 15.0 g/dL   HCT 32.9 (L) 36.0 - 46.0 %   MCV 89.9 78.0 - 100.0 fL   MCH 29.2 26.0 - 34.0 pg   MCHC 32.5 30.0 - 36.0 g/dL   RDW 13.4 11.5 - 15.5 %   Platelets 210 150 - 400 K/uL  Urinalysis, Routine w reflex microscopic (not at [facility]) Status: None   Collection Time: 09/07/14 10:28 PM  Result Value Ref Range   Color, Urine YELLOW YELLOW   APPearance CLEAR CLEAR   Specific Gravity, Urine 1.030 1.005 - 1.030   pH 6.0 5.0 - 8.0   Glucose, UA NEGATIVE NEGATIVE mg/dL   Hgb urine dipstick NEGATIVE NEGATIVE   Bilirubin Urine NEGATIVE NEGATIVE   Ketones, ur NEGATIVE NEGATIVE mg/dL   Protein, ur NEGATIVE NEGATIVE mg/dL   Urobilinogen, UA 0.2 0.0 - 1.0 mg/dL   Nitrite NEGATIVE NEGATIVE   Leukocytes, UA NEGATIVE NEGATIVE    Comment: MICROSCOPIC NOT DONE ON URINES WITH NEGATIVE PROTEIN, BLOOD, LEUKOCYTES, NITRITE, OR GLUCOSE <1000 mg/dL.  POC urine preg, ED (not at [facility]) Status: Abnormal   Collection Time: 09/07/14 10:36 PM  Result Value Ref Range   Preg Test, Ur POSITIVE (A) NEGATIVE    Comment:   THE SENSITIVITY OF THIS METHODOLOGY IS >24 mIU/mL        Imaging Results (Last 48 hours)    No results found.    Assessment/Plan: Pregnancy unknown location' Syncope Suspected ectopic until ruled out  [facility], [facility] here.   , V 09/07/2014     Upon arrival to [facility], stat bedside u/s shows abdominal free blood, and pt has become unstable with pulse increase to 140's  Emergency release activated,  and pt consented for exploratory laparotomy for presumed ectopic.  OR and Anesthesia notified. Pt consented, for surgery and verbally for transfusions. TO OR for laparotomy for ectopic

## 2014-09-08 NOTE — Anesthesia Preprocedure Evaluation (Signed)
Anesthesia Evaluation  Patient identified by MRN, date of birth, ID band Patient awake    Reviewed: Allergy & Precautions, NPO status , Patient's Chart, lab work & pertinent test resultsPreop documentation limited or incomplete due to emergent nature of procedure.  History of Anesthesia Complications Negative for: history of anesthetic complications  Airway Mallampati: II  TM Distance: >3 FB Neck ROM: Full    Dental no notable dental hx. (+) Dental Advisory Given   Pulmonary Current Smoker,  breath sounds clear to auscultation  Pulmonary exam normal       Cardiovascular negative cardio ROS Normal cardiovascular examRhythm:Regular Rate:Normal     Neuro/Psych PSYCHIATRIC DISORDERS Anxiety Depression negative neurological ROS     GI/Hepatic negative GI ROS, Neg liver ROS,   Endo/Other  negative endocrine ROS  Renal/GU negative Renal ROS  negative genitourinary   Musculoskeletal negative musculoskeletal ROS (+)   Abdominal   Peds negative pediatric ROS (+)  Hematology negative hematology ROS (+)   Anesthesia Other Findings   Reproductive/Obstetrics Ectopic pregnancy                             Anesthesia Physical Anesthesia Plan  ASA: II and emergent  Anesthesia Plan: General   Post-op Pain Management:    Induction: Intravenous, Rapid sequence and Cricoid pressure planned  Airway Management Planned: Oral ETT  Additional Equipment:   Intra-op Plan:   Post-operative Plan: Extubation in OR  Informed Consent: I have reviewed the patients History and Physical, chart, labs and discussed the procedure including the risks, benefits and alternatives for the proposed anesthesia with the patient or authorized representative who has indicated his/her understanding and acceptance.   Dental advisory given  Plan Discussed with: CRNA  Anesthesia Plan Comments:         Anesthesia  Quick Evaluation

## 2014-09-08 NOTE — MAU Note (Signed)
Transfer from [facility].

## 2014-09-08 NOTE — Progress Notes (Signed)
Day of Surgery Procedure(s) (LRB): EXPLORATORY LAPAROTOMY WITH RIGHT SALPINGECTOMY (N/A)  Subjective: Patient reports incisional pain and tolerating PO.    Objective: I have reviewed patient's vital signs, intake and output and labs.  General: alert, cooperative and mild distress Resp: clear to auscultation bilaterally GI: soft, non-tender; bowel sounds normal; no masses,  no organomegaly and incision: clean, dry and dressing in place CBC Latest Ref Rng 09/08/2014 09/08/2014 09/08/2014  WBC 4.0 - 10.5 K/uL 14.6(H) - 12.7(H)  Hemoglobin 12.0 - 15.0 g/dL 11.4(L) 7.0(L) 6.4(LL)  Hematocrit 36.0 - 46.0 % 33.6(L) 20.9(L) 18.9(L)  Platelets 150 - 400 K/uL 99(L) 111(L) 156     Assessment: s/p Procedure(s) with comments: EXPLORATORY LAPAROTOMY WITH RIGHT SALPINGECTOMY (N/A) - Ruptured Right Ectopic Pregnancy: stable, progressing well and anemia  Plan: Advance diet Advance to PO medication saline lock iv, check cbc in am, probable d/c sunday  LOS: 0 days    Janet Hughes V 09/08/2014, 9:33 AM

## 2014-09-08 NOTE — Transfer of Care (Signed)
Immediate Anesthesia Transfer of Care Note  Patient: Janet Hughes  Procedure(s) Performed: Procedure(s) with comments: EXPLORATORY LAPAROTOMY WITH RIGHT SALPINGECTOMY (N/A) - Ruptured Right Ectopic Pregnancy  Patient Location: PACU  Anesthesia Type:General  Level of Consciousness: awake, alert , oriented and patient cooperative  Airway & Oxygen Therapy: Patient Spontanous Breathing and Patient connected to nasal cannula oxygen  Post-op Assessment: Report given to RN and Post -op Vital signs reviewed and stable  Post vital signs: Reviewed and stable  Last Vitals:  TEMP 97.6 BP 115/57 HR 83 RR 19 O2SAT 97.6  Complications: No apparent anesthesia complications

## 2014-09-08 NOTE — Anesthesia Postprocedure Evaluation (Signed)
  Anesthesia Post-op Note  Patient: Janet Hughes  Procedure(s) Performed: Procedure(s) (LRB): EXPLORATORY LAPAROTOMY WITH RIGHT SALPINGECTOMY (N/A)  Patient Location: PACU  Anesthesia Type: General  Level of Consciousness: awake and alert   Airway and Oxygen Therapy: Patient Spontanous Breathing  Post-op Pain: mild  Post-op Assessment: Post-op Vital signs reviewed, Patient's Cardiovascular Status Stable, Respiratory Function Stable, Patent Airway and No signs of Nausea or vomiting  Last Vitals:  Filed Vitals:   09/08/14 0245  BP: 115/57  Pulse: 80  Temp: 36.4 C  Resp: 9    Post-op Vital Signs: stable   Complications: No apparent anesthesia complications

## 2014-09-08 NOTE — OR Nursing (Signed)
Code MTP called at 0107

## 2014-09-08 NOTE — ED Notes (Signed)
Patient leaving for [facility] at this time with [facility].

## 2014-09-08 NOTE — Anesthesia Procedure Notes (Signed)
Procedure Name: MAC Date/Time: 09/08/2014 12:48 AM Performed by: Collier Flowers Pre-anesthesia Checklist: Patient identified, Emergency Drugs available, Suction available and Timeout performed Patient Re-evaluated:Patient Re-evaluated prior to inductionOxygen Delivery Method: Circle system utilized Preoxygenation: Pre-oxygenation with 100% oxygen Intubation Type: IV induction, Rapid sequence and Cricoid Pressure applied Laryngoscope Size: 3 Grade View: Grade I Tube type: Oral Tube size: 7.0 mm Number of attempts: 1 Airway Equipment and Method: Stylet Placement Confirmation: ETT inserted through vocal cords under direct vision,  positive ETCO2 and breath sounds checked- equal and bilateral Secured at: 21 (AT LIPS) cm Tube secured with: Tape Dental Injury: Teeth and Oropharynx as per pre-operative assessment

## 2014-09-08 NOTE — Op Note (Signed)
09/07/2014 - 09/08/2014  2:05 AM  PATIENT:  Janet Hughes  34 y.o. female  PRE-OPERATIVE DIAGNOSIS:  Ruptured Ectopic Pregnancy  POST-OPERATIVE DIAGNOSIS:  Ruptured Right Ectopic Pregnancy, hemoperitoneum  PROCEDURE:  Procedure(s) with comments: EXPLORATORY LAPAROTOMY WITH RIGHT SALPINGECTOMY (N/A) - Ruptured Right Ectopic Pregnancy  SURGEON:  Surgeon(s) and Role:    * Tilda Burrow, MD - Primary  PHYSICIAN ASSISTANT:   ASSISTANTS: none   ANESTHESIA:   general  EBL:  Total I/O In: 4325 [I.V.:3800; Blood:25; IV Piggyback:500] Out: 1650 [Blood:1650]  BLOOD ADMINISTERED:4 units CC PRBC  DRAINS: Urinary Catheter (Foley)   LOCAL MEDICATIONS USED:  NONE  SPECIMEN:  Source of Specimen:  right tube with ectopic  DISPOSITION OF SPECIMEN:  PATHOLOGY  COUNTS:  YES  TOURNIQUET:  * No tourniquets in log *  DICTATION: .Dragon Dictation  PLAN OF CARE: Admit to inpatient   PATIENT DISPOSITION:  PACU - hemodynamically stable.   Delay start of Pharmacological VTE agent (>24hrs) due to surgical blood loss or risk of bleeding: not applicable Details of procedure: Patient was taken rapidly from the MAU to the operating room, general anesthesia introduced after transfer to the room is operating table, abdomen prepped and draped with Foley catheter in place. Transfusion of the first 2 units packed cells was in place Ancef was administered 2 g intravenous. Transverse lower abdominal incision was performed and this method of the old C-section scar, Pfannenstiel, and sharp dissection to open the peritoneal cavity in the midline. A large amount of blood and clots were removed from the abdomen. It took several minutes to suction out sufficiently that we could have any chance of seeing the pelvis initial blood removed was over thousand cc. Clots were removed as well. Alexis wound retractor was positioned and would could then finally see the tubes and ovaries. Left tube was grossly normal as  well as the left ovary. Right tube was normal distally that had at its proximal portion approximately 1 cm from the cornu a tiny ectopic of approximately 1-1/2 cm length by 1 cm which was actively bleeding. Right salpingectomy was performed using Kelly clamps across the mesosalpinx and 2-0 Vicryl ligatures. Basis was achieved. This was further irrigated copiously after all as many clots as could be found. The upper abdomen was copiously irrigated with fluid total of 2 L used and the irrigation discontinued when the returned fluid was a thin watery pink color. Laparotomy counts were correct, anterior peritoneum closed with running 2-0 chromic, the fascia closed running 0 Vicryl, the subcutaneous tissues approximated with continuous running 20 plain and then subcuticular 4-0 Vicryl on a Keith needle used to skin closure is applied and patient to recovery room in good condition. A third unit of packed cells was administered in the OR and a fourth unit will be administered postop. Intraoperatively the hemoglobin was 6.4, after 2 units packed cells, unchanged from the last preoperatively value of 6.4. Hemostasis was good so no fresh frozen plasma is administered at this time but it will be added if additional blood was required condition were to recovery room good

## 2014-09-08 NOTE — Progress Notes (Signed)
CRITICAL VALUE ALERT  Critical value received:  Hbg 6.4  Date of notification:  8/27  Time of notification:  0100  Critical value read back:Yes.    Nurse who received alert:  Hardie Pulley   MD notified (1st page):  ANesthesia Attending and Emelda Fear, MD  Time of first page:  0115  MD notified (2nd page):  Time of second page:  Responding MD:  Anesthesia   Time MD responded:  [id number]

## 2014-09-08 NOTE — Plan of Care (Signed)
Problem: Phase I Progression Outcomes Goal: OOB as tolerated unless otherwise ordered Outcome: Progressing Ambulated to bathroom and back to bed.

## 2014-09-09 LAB — CBC
HCT: 25.5 % — ABNORMAL LOW (ref 36.0–46.0)
Hemoglobin: 8.7 g/dL — ABNORMAL LOW (ref 12.0–15.0)
MCH: 30.2 pg (ref 26.0–34.0)
MCHC: 34.1 g/dL (ref 30.0–36.0)
MCV: 88.5 fL (ref 78.0–100.0)
PLATELETS: 96 10*3/uL — AB (ref 150–400)
RBC: 2.88 MIL/uL — ABNORMAL LOW (ref 3.87–5.11)
RDW: 14.3 % (ref 11.5–15.5)
WBC: 9.9 10*3/uL (ref 4.0–10.5)

## 2014-09-09 LAB — HIV ANTIBODY (ROUTINE TESTING W REFLEX): HIV SCREEN 4TH GENERATION: NONREACTIVE

## 2014-09-09 LAB — RPR: RPR: NONREACTIVE

## 2014-09-09 MED ORDER — OXYCODONE-ACETAMINOPHEN 5-325 MG PO TABS: 1.0000 | ORAL_TABLET | ORAL | Status: DC | PRN

## 2014-09-09 MED ORDER — IBUPROFEN 600 MG PO TABS: 600.0000 mg | ORAL_TABLET | Freq: Four times a day (QID) | ORAL | Status: DC | PRN

## 2014-09-09 NOTE — Discharge Summary (Signed)
Physician Discharge Summary  Patient ID: Janet Hughes MRN: [id number] DOB/AGE: 33-11-1980 33 y.o.  Admit date: 09/07/2014 Discharge date: 09/09/2014  Admission Diagnoses:ruptured tubal ectopic pregnancy  Discharge Diagnoses: same Active Problems:   Ectopic pregnancy   Hemoperitoneum due to rupture of right tubal ectopic pregnancy   Discharged Condition: good 2:05 AM  PATIENT: Janet Hughes 34 y.o. female  PRE-OPERATIVE DIAGNOSIS: Ruptured Ectopic Pregnancy  POST-OPERATIVE DIAGNOSIS: Ruptured Right Ectopic Pregnancy, hemoperitoneum  PROCEDURE: Procedure(s) with comments: EXPLORATORY LAPAROTOMY WITH RIGHT SALPINGECTOMY (N/A) - Ruptured Right Ectopic Pregnancy  SURGEON: Surgeon(s) and Role:  * Tilda Burrow, MD - Primary  PHYSICIAN ASSISTANT:   ASSISTANTS: none   ANESTHESIA: general  EBL: Total I/O In: 4325 [I.V.:3800; Blood:25; IV Piggyback:500] Out: 1650 [Blood:1650]  BLOOD ADMINISTERED:4 units CC PRBC  DRAINS: Urinary Catheter (Foley)   LOCAL MEDICATIONS USED: NONE  SPECIMEN: Source of Specimen: right tube with ectopic  DISPOSITION OF SPECIMEN: PATHOLOGY  COUNTS: YES  TOURNIQUET: * No tourniquets in log *  DICTATION: .Dragon Dictation  PLAN OF CARE: Admit to inpatient   PATIENT DISPOSITION: PACU - hemodynamically stable.  Delay start of Pharmacological VTE agent (>24hrs) due to surgical blood loss or risk of bleeding: not applicable Details of procedure: Patient was taken rapidly from the MAU to the operating room, general anesthesia introduced after transfer to the room is operating table, abdomen prepped and draped with Foley catheter in place. Transfusion of the first 2 units packed cells was in place Ancef was administered 2 g intravenous. Transverse lower abdominal incision was performed and this method of the old C-section scar, Pfannenstiel, and sharp dissection to open the peritoneal cavity in the midline. A large  amount of blood and clots were removed from the abdomen. It took several minutes to suction out sufficiently that we could have any chance of seeing the pelvis initial blood removed was over thousand cc. Clots were removed as well. Alexis wound retractor was positioned and would could then finally see the tubes and ovaries. Left tube was grossly normal as well as the left ovary. Right tube was normal distally that had at its proximal portion approximately 1 cm from the cornu a tiny ectopic of approximately 1-1/2 cm length by 1 cm which was actively bleeding. Right salpingectomy was performed using Kelly clamps across the mesosalpinx and 2-0 Vicryl ligatures. Basis was achieved. This was further irrigated copiously after all as many clots as could be found. The upper abdomen was copiously irrigated with fluid total of 2 L used and the irrigation discontinued when the returned fluid was a thin watery pink color. Laparotomy counts were correct, anterior peritoneum closed with running 2-0 chromic, the fascia closed running 0 Vicryl, the subcutaneous tissues approximated with continuous running 20 plain and then subcuticular 4-0 Vicryl on a Keith needle used to skin closure is applied and patient to recovery room in good condition. A third unit of packed cells was administered in the OR and a fourth unit will be administered postop. Intraoperatively the hemoglobin was 6.4, after 2 units packed cells, unchanged from the last preoperatively value of 6.4. Hemostasis was good so no fresh frozen plasma is administered at this time but it will be added if additional blood was required condition were to recovery room good        Hospital Course: Janet Hughes is an 34 y.o. female. She is seen in room E 48 for acute onset abdominal pain. She had onset at 8 pm, and presented to  the ED . She had lower abd pain , and LMP was last month . Last sex was "3 months ago" No Contraception. She was initially sycopal; at 11:00  and has responded to IV fluids by 2 iv lines. She is being transferred for care, now that VS have improved. Diagnosis was tubal ectopic pregnancy.  Consults: None  Significant Diagnostic Studies: radiology: Ultrasound: pelvic  Treatments: IV hydration, surgery: exploratory laparotomy, right salpingectomy and transfusion  Discharge Exam: Blood pressure 107/67, pulse 74, temperature 98.2 F (36.8 C), temperature source Oral, resp. rate 18, height  (1.575 m), weight 125 lb (56.7 kg), last menstrual period 08/12/2014, SpO2 99 %. General appearance: alert, cooperative and no distress GI: soft, non-tender; bowel sounds normal; no masses,  no organomegaly and dressing dry and intact Extremities: extremities normal, atraumatic, no cyanosis or edema  Disposition: 01-Home or Self Care  Discharge Instructions    Discharge patient    Complete by:  As directed   To home            Medication List    TAKE these medications        ALPRAZolam 0.5 MG tablet  Commonly known as:  XANAX  Take 1 tablet (0.5 mg total) by mouth at bedtime as needed for sleep.     amphetamine-dextroamphetamine 20 MG tablet  Commonly known as:  ADDERALL  Take 1 tablet (20 mg total) by mouth 2 (two) times daily.     ibuprofen 600 MG tablet  Commonly known as:  ADVIL,MOTRIN  Take 1 tablet (600 mg total) by mouth every 6 (six) hours as needed (mild pain).     oxyCODONE-acetaminophen 5-325 MG per tablet  Commonly known as:  PERCOCET/ROXICET  Take 1-2 tablets by mouth every 4 (four) hours as needed for severe pain (moderate to severe pain (when tolerating fluids)).     traZODone 50 MG tablet  Commonly known as:  DESYREL  Take 0.5-1 tablets (25-50 mg total) by mouth at bedtime as needed for sleep.           Follow-up Information    Follow up with [facility] In 3 weeks.   Specialty:  Obstetrics and Gynecology   Contact information:   [address] [place]  [address] [phone]      Signed: Scheryl Darter 09/09/2014, 8:52 AM

## 2014-09-09 NOTE — Discharge Instructions (Signed)
°Ectopic Pregnancy °An ectopic pregnancy is when the fertilized egg attaches (implants) outside the uterus. Most ectopic pregnancies occur in the fallopian tube. Rarely do ectopic pregnancies occur on the ovary, intestine, pelvis, or cervix. In an ectopic pregnancy, the fertilized egg does not have the ability to develop into a normal, healthy baby.  °A ruptured ectopic pregnancy is one in which the fallopian tube gets torn or bursts and results in internal bleeding. Often there is intense abdominal pain, and sometimes, vaginal bleeding. Having an ectopic pregnancy can be life threatening. If left untreated, this dangerous condition can lead to a blood transfusion, abdominal surgery, or even death. °CAUSES  °Damage to the fallopian tubes is the suspected cause in most ectopic pregnancies.  °RISK FACTORS °Depending on your circumstances, the risk of having an ectopic pregnancy will vary. The level of risk can be divided into three categories. °High Risk °· You have gone through infertility treatment. °· You have had a previous ectopic pregnancy. °· You have had previous tubal surgery. °· You have had previous surgery to have the fallopian tubes tied (tubal ligation). °· You have tubal problems or diseases. °· You have been exposed to DES. DES is a medicine that was used until 1971 and had effects on babies whose mothers took the medicine. °· You become pregnant while using an intrauterine device (IUD) for birth control.  °Moderate Risk °· You have a history of infertility. °· You have a history of a sexually transmitted infection (STI). °· You have a history of pelvic inflammatory disease (PID). °· You have scarring from endometriosis. °· You have multiple sexual partners. °· You smoke.  °Low Risk °· You have had previous pelvic surgery. °· You use vaginal douching. °· You became sexually active before 33 years of age. °SIGNS AND SYMPTOMS  °An ectopic pregnancy should be suspected in anyone who has missed a period  and has abdominal pain or bleeding. °· You may experience normal pregnancy symptoms, such as: °¨ Nausea. °¨ Tiredness. °¨ Breast tenderness. °· Other symptoms may include: °¨ Pain with intercourse. °¨ Irregular vaginal bleeding or spotting. °¨ Cramping or pain on one side or in the lower abdomen. °¨ Fast heartbeat. °¨ Passing out while having a bowel movement. °· Symptoms of a ruptured ectopic pregnancy and internal bleeding may include: °¨ Sudden, severe pain in the abdomen and pelvis. °¨ Dizziness or fainting. °¨ Pain in the shoulder area. °DIAGNOSIS  °Tests that may be performed include: °· A pregnancy test. °· An ultrasound test. °· Testing the specific level of pregnancy hormone in the bloodstream. °· Taking a sample of uterus tissue (dilation and curettage, D&C). °· Surgery to perform a visual exam of the inside of the abdomen using a thin, lighted tube with a tiny camera on the end (laparoscope). °TREATMENT  °An injection of a medicine called methotrexate may be given. This medicine causes the pregnancy tissue to be absorbed. It is given if: °· The diagnosis is made early. °· The fallopian tube has not ruptured. °· You are considered to be a good candidate for the medicine. °Usually, pregnancy hormone blood levels are checked after methotrexate treatment. This is to be sure the medicine is effective. It may take 4-6 weeks for the pregnancy to be absorbed (though most pregnancies will be absorbed by 3 weeks). °Surgical treatment may be needed. A laparoscope may be used to remove the pregnancy tissue. If severe internal bleeding occurs, a cut (incision) may be made in the lower abdomen (laparotomy), and the ectopic   pregnancy is removed. This stops the bleeding. Part of the fallopian tube, or the whole tube, may be removed as well (salpingectomy). After surgery, pregnancy hormone tests may be done to be sure there is no pregnancy tissue left. You may receive a Rho (D) immune globulin shot if you are Rh negative  and the father is Rh positive, or if you do not know the Rh type of the father. This is to prevent problems with any future pregnancy. °SEEK IMMEDIATE MEDICAL CARE IF:  °You have any symptoms of an ectopic pregnancy. This is a medical emergency. °MAKE SURE YOU: °· Understand these instructions. °· Will watch your condition. °· Will get help right away if you are not doing well or get worse. °Document Released: 02/06/2004 Document Revised: 05/15/2013 Document Reviewed: 07/28/2012 °ExitCare® Patient Information ©2015 [employer]. This information is not intended to replace advice given to you by your health care provider. Make sure you discuss any questions you have with your health care provider. ° ° °

## 2014-09-10 ENCOUNTER — Encounter (HOSPITAL_COMMUNITY): Payer: Self-pay | Admitting: Obstetrics and Gynecology

## 2014-09-10 ENCOUNTER — Telehealth: Payer: Self-pay

## 2014-09-10 NOTE — Telephone Encounter (Addendum)
Pt called and LM stated that she has a f/u scheduled on 09/27/14 @ 4pm for ruptured ectopic pregnancy and she wants to know if she needs to come for her appt before being able to get a return to work letter?  8/30  1225  Called pt and left message stating that I am returning her call to answer her questions. Please call back and state whether a detailed message can be left on her voice mail. Pt should be informed that she does need to wait until her clinic appt on 9/15 before returning to work. She suffered a ruptured ectopic pregnancy which resulted in hemoperitoneum and required administration of 4 units PRBC. She was anemic on day of discharge and she requires adequate time for healing and recovery.   Diane Day [facility]  8/30  1430  Pt called back at 1250 and left message that she also has questions about removing the honeycomb dressing. I returned her call and discussed her concerns. She stated that she was told she may remove the dressing but there is one stitch that is stitched to the honeycomb dressing and she did not want to cut the stitch. I advised pt that she may remove the honeycomb dressing while in the shower working from the edges inward. When she gets to the spot with the stitch she may cut around the stitch and leave the small part of honeycomb dressing in place. This will fall off if the sutures are dissolvable or can be removed by the doctor at her visit on 9/15. Pt was also advised of the need to remain out of work until her f/u visit for the reasons stated in my note previously.  She voiced understanding of all information and instructions given.  Diane Day RNC

## 2014-09-10 NOTE — Addendum Note (Signed)
Addendum  created 09/10/14 1502 by Collier Flowers, CRNA   Modules edited: Anesthesia Events, Charges VN, Narrator   Narrator:  Narrator: Event Log Edited

## 2014-09-11 NOTE — Patient Instructions (Signed)
Insomnia Insomnia is frequent trouble falling and/or staying asleep. Insomnia can be a long term problem or a short term problem. Both are common. Insomnia can be a short term problem when the wakefulness is related to a certain stress or worry. Long term insomnia is often related to ongoing stress during waking hours and/or poor sleeping habits. Overtime, sleep deprivation itself can make the problem worse. Every little thing feels more severe because you are overtired and your ability to cope is decreased. CAUSES   Stress, anxiety, and depression.  Poor sleeping habits.  Distractions such as TV in the bedroom.  Naps close to bedtime.  Engaging in emotionally charged conversations before bed.  Technical reading before sleep.  Alcohol and other sedatives. They may make the problem worse. They can hurt normal sleep patterns and normal dream activity.  Stimulants such as caffeine for several hours prior to bedtime.  Pain syndromes and shortness of breath can cause insomnia.  Exercise late at night.  Changing time zones may cause sleeping problems (jet lag). It is sometimes helpful to have someone observe your sleeping patterns. They should look for periods of not breathing during the night (sleep apnea). They should also look to see how long those periods last. If you live alone or observers are uncertain, you can also be observed at a sleep clinic where your sleep patterns will be professionally monitored. Sleep apnea requires a checkup and treatment. Give your caregivers your medical history. Give your caregivers observations your family has made about your sleep.  SYMPTOMS   Not feeling rested in the morning.  Anxiety and restlessness at bedtime.  Difficulty falling and staying asleep. TREATMENT   Your caregiver may prescribe treatment for an underlying medical disorders. Your caregiver can give advice or help if you are using alcohol or other drugs for self-medication. Treatment  of underlying problems will usually eliminate insomnia problems.  Medications can be prescribed for short time use. They are generally not recommended for lengthy use.  Over-the-counter sleep medicines are not recommended for lengthy use. They can be habit forming.  You can promote easier sleeping by making lifestyle changes such as:  Using relaxation techniques that help with breathing and reduce muscle tension.  Exercising earlier in the day.  Changing your diet and the time of your last meal. No night time snacks.  Establish a regular time to go to bed.  Counseling can help with stressful problems and worry.  Soothing music and white noise may be helpful if there are background noises you cannot remove.  Stop tedious detailed work at least one hour before bedtime. HOME CARE INSTRUCTIONS   Keep a diary. Inform your caregiver about your progress. This includes any medication side effects. See your caregiver regularly. Take note of:  Times when you are asleep.  Times when you are awake during the night.  The quality of your sleep.  How you feel the next day. This information will help your caregiver care for you.  Get out of bed if you are still awake after 15 minutes. Read or do some quiet activity. Keep the lights down. Wait until you feel sleepy and go back to bed.  Keep regular sleeping and waking hours. Avoid naps.  Exercise regularly.  Avoid distractions at bedtime. Distractions include watching television or engaging in any intense or detailed activity like attempting to balance the household checkbook.  Develop a bedtime ritual. Keep a familiar routine of bathing, brushing your teeth, climbing into bed at the same   time each night, listening to soothing music. Routines increase the success of falling to sleep faster.  Use relaxation techniques. This can be using breathing and muscle tension release routines. It can also include visualizing peaceful scenes. You can  also help control troubling or intruding thoughts by keeping your mind occupied with boring or repetitive thoughts like the old concept of counting sheep. You can make it more creative like imagining planting one beautiful flower after another in your backyard garden.  During your day, work to eliminate stress. When this is not possible use some of the previous suggestions to help reduce the anxiety that accompanies stressful situations. MAKE SURE YOU:   Understand these instructions.  Will watch your condition.  Will get help right away if you are not doing well or get worse. Document Released: 12/27/1999 Document Revised: 03/23/2011 Document Reviewed: 01/26/2007 ExitCare Patient Information 2015 [employer]. This information is not intended to replace advice given to you by your health care provider. Make sure you discuss any questions you have with your health care provider.  

## 2014-09-11 NOTE — Progress Notes (Signed)
   Subjective:   Janet Hughes is a 34 y.o. female with a history of depression and anxiety here for f/u anxiety and insomnia.  Pt reports anxiety symptoms much better since out of school, no needs for meds at this time per her.  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 She sometimes takes melatonin which usually works well but not always. When that isn't enough she uses xanax which works very well for her and doesn't leave her groggy the next morning like trazodone. She usually only takes 1/4-1/2 tab of xanax at a time and it last her several months.  Review of Systems:  Per HPI. All other systems reviewed and are negative.   PMH, PSH, Medications, Allergies, and FmHx reviewed and updated in EMR.  Social History: current smoker  Objective:  BP 126/79 mmHg  Pulse 80  Temp(Src) 98.6 F (37 C) (Oral)  Ht  (1.575 m)  Wt 125 lb 3.2 oz (56.79 kg)  BMI 22.89 kg/m2  Gen:  34 y.o. female in NAD HEENT: NCAT, MMM, EOMI, PERRL, anicteric sclerae CV: RRR, no MRG, no JVD Resp: Non-labored, CTAB, no wheezes noted Abd: Soft, NTND, BS present, no guarding or organomegaly Ext: WWP, no edema MSK: Full ROM, strength intact Neuro: Alert and oriented, speech normal      Chemistry      Component Value Date/Time   NA 137 09/08/2014 0510   K 4.0 09/08/2014 0510   CL 117* 09/08/2014 0510   CO2 20* 09/08/2014 0510   BUN 11 09/08/2014 0510   CREATININE 0.75 09/08/2014 0510   CREATININE 0.94 03/31/2012 0859      Component Value Date/Time   CALCIUM 6.6* 09/08/2014 0510   ALKPHOS 37* 09/07/2014 2145   AST 19 09/07/2014 2145   ALT 14 09/07/2014 2145   BILITOT 0.3 09/07/2014 2145      Lab Results  Component Value Date   WBC 9.9 09/09/2014   HGB 8.7* 09/09/2014   HCT 25.5* 09/09/2014   MCV 88.5 09/09/2014   PLT 96* 09/09/2014   Lab Results  Component Value Date     TSH 1.015 12/29/2011   No results found for: HGBA1C Assessment & Plan:     Janet Hughes is a 34 y.o. female here for insomnia  Anxiety Improved now that graduated school, no meds at this point and coping well - continue to monitor  Insomnia Patient has rotating call schedule (works as RT), has trouble sleeping during the day, usually uses melatonin but occasionally needs something stronger, felt "like a zombie" on trazodone even low dose - stop trazodone - continue xanax prn insomnia as this works well for her and she takes very low dose rarely, no concerns for abuse or diversion    Beverely Low, MD, MPH [facility] PGY-3 09/11/2014 3:52 PM

## 2014-09-11 NOTE — Assessment & Plan Note (Signed)
Patient has rotating call schedule (works as RT), has trouble sleeping during the day, usually uses melatonin but occasionally needs something stronger, felt "like a zombie" on trazodone even low dose - stop trazodone - continue xanax prn insomnia as this works well for her and she takes very low dose rarely, no concerns for abuse or diversion   

## 2014-09-11 NOTE — Assessment & Plan Note (Signed)
Improved now that graduated school, no meds at this point and coping well - continue to monitor

## 2014-09-18 ENCOUNTER — Telehealth: Payer: Self-pay | Admitting: Obstetrics and Gynecology

## 2014-09-18 NOTE — Telephone Encounter (Signed)
Gave pt office number to call for any surgical postop questions

## 2014-09-19 ENCOUNTER — Telehealth: Payer: Self-pay | Admitting: Obstetrics and Gynecology

## 2014-09-19 NOTE — Telephone Encounter (Signed)
Pt doing well, will use Mederma or equvalent on the scar for healing. Questions answered.

## 2014-09-25 LAB — TYPE AND SCREEN
ABO/RH(D): O POS
ANTIBODY SCREEN: NEGATIVE
UNIT DIVISION: 0
UNIT DIVISION: 0
Unit division: 0
Unit division: 0
Unit division: 0
Unit division: 0

## 2014-09-25 LAB — PREPARE FRESH FROZEN PLASMA
UNIT DIVISION: 0
Unit division: 0

## 2014-09-27 ENCOUNTER — Encounter: Payer: Self-pay | Admitting: Obstetrics & Gynecology

## 2014-09-27 ENCOUNTER — Ambulatory Visit (INDEPENDENT_AMBULATORY_CARE_PROVIDER_SITE_OTHER): Payer: Medicaid Other | Admitting: Obstetrics & Gynecology

## 2014-09-27 VITALS — BP 136/66 | HR 92 | Temp 98.7°F | Ht 62.0 in | Wt 129.0 lb

## 2014-09-27 DIAGNOSIS — Z9889 Other specified postprocedural states: Secondary | ICD-10-CM

## 2014-09-27 DIAGNOSIS — K661 Hemoperitoneum: Secondary | ICD-10-CM

## 2014-09-27 DIAGNOSIS — Z30011 Encounter for initial prescription of contraceptive pills: Secondary | ICD-10-CM

## 2014-09-27 DIAGNOSIS — O00101 Right tubal pregnancy without intrauterine pregnancy: Principal | ICD-10-CM

## 2014-09-27 MED ORDER — NORGESTIMATE-ETH ESTRADIOL 0.25-35 MG-MCG PO TABS: 1.0000 | ORAL_TABLET | Freq: Every day | ORAL | Status: DC

## 2014-09-27 NOTE — Patient Instructions (Signed)
Return to clinic for any scheduled appointments or for any gynecologic concerns as needed.   

## 2014-09-27 NOTE — Progress Notes (Signed)
CLINIC ENCOUNTER NOTE  History:  34 y.o. [id number] here today for follow up after ELAP for hemoperitoneum for ruptured ectopic pregnancy on 09/08/14.  She was in shock from blood loss and received 4 units of pRBCs.  No postoperative complications. She denies any abnormal vaginal discharge, bleeding, pelvic pain or other concerns.   Past Medical History  Diagnosis Date  . Post partum depression   . Abortion, therapeutic 12/01  . Chlamydia infection     repeated infections  . Anxiety     Past Surgical History  Procedure Laterality Date  . Colposcopy      no bx, pregnant  . Cesarean section      2006  . Breast enhancement surgery Bilateral 04/26/2012  . Laparotomy N/A 09/08/2014    Procedure: EXPLORATORY LAPAROTOMY WITH RIGHT SALPINGECTOMY;  Surgeon: Tilda Burrow, MD;  Location: [facility];  Service: Gynecology;  Laterality: N/A;  Ruptured Right Ectopic Pregnancy   The following portions of the patient's history were reviewed and updated as appropriate: allergies, current medications, past family history, past medical history, past social history, past surgical history and problem list.   Health Maintenance:  Normal pap and negative HRHPV on 05/25/14.    Review of Systems:  Pertinent items are noted in HPI. Comprehensive review of systems was otherwise negative.  Objective:  Physical Exam BP 136/66 mmHg  Pulse 92  Temp(Src) 98.7 F (37.1 C) (Oral)  Ht  (1.575 m)  Wt 129 lb (58.514 kg)  BMI 23.59 kg/m2  LMP 09/12/2014 CONSTITUTIONAL: Well-developed, well-nourished female in no acute distress.  HENT:  Normocephalic, atraumatic. External right and left ear normal. Oropharynx is clear and moist EYES: Conjunctivae and EOM are normal. Pupils are equal, round, and reactive to light. No scleral icterus.  NECK: Normal range of motion, supple, no masses SKIN: Skin is warm and dry. No rash noted. Not diaphoretic. No erythema. No pallor. NEUROLGIC: Alert and oriented to person,  place, and time. Normal reflexes, muscle tone coordination. No cranial nerve deficit noted. PSYCHIATRIC: Normal mood and affect. Normal behavior. Normal judgment and thought content. CARDIOVASCULAR: Normal heart rate noted RESPIRATORY: Effort and breath sounds normal, no problems with respiration noted ABDOMEN: Soft, no distention noted.   PELVIC: Deferred MUSCULOSKELETAL: Normal range of motion. No edema noted.  Labs and Imaging US Ob Comp Less 14 Wks  09/08/2014   CLINICAL DATA:  Lower abdominal pain, first trimester. Estimated gestational age by LMP is 3 weeks 6 days. Quantitative beta HCG is 1646.  EXAM: OBSTETRIC <14 WK ULTRASOUND  TECHNIQUE: Transabdominal ultrasound was performed for evaluation of the gestation as well as the maternal uterus and adnexal regions.  COMPARISON:  None.  FINDINGS: Abdominal ultrasound images of the pelvis are obtained. The pelvis appears to be filled with heterogeneous echogenic material consistent with hemorrhage. Gas-filled bowel loops are also present. Appearance is highly suspicious for ruptured ectopic pregnancy. Because of the abdominal contents, we run able to visualize the uterus or ovaries and cannot confirm the presence or absence of an intrauterine pregnancy.  IMPRESSION: Heterogeneous mixed echotexture material demonstrated throughout the visualized pelvis suspicious for hemo peritoneum and dilated bowel loops. Presence hemo peritoneum suggests probable ruptured ectopic pregnancy. Uterus and ovaries were not visualized and therefore we cannot confirm or exclude the presence of an intrauterine pregnancy.  Dr. Emelda Fear was present during the examination and is aware of the findings.   Electronically Signed   By: Burman Nieves M.D.   On: 09/08/2014 01:07  09/08/14  Surgical pathology Fallopian tube, ectopic pregnancy, right CHORIONIC VILLI ARE IDENTIFIED FALLOPIAN TUBE: WITH REACTIVE CHANGES. NO PATHOLOGICAL ALTERATIONS  Assessment & Plan:  Pathology  reviewed with patient OCPs prescribed for contraception Work letter given to patient Routine preventative health maintenance measures emphasized. Please refer to After Visit Summary for other counseling recommendations.    Janet Collins, MD, FACOG Attending Obstetrician & Gynecologist, [facility] and Center for [facility]

## 2014-09-28 ENCOUNTER — Telehealth: Payer: Self-pay | Admitting: General Practice

## 2014-09-28 ENCOUNTER — Encounter: Payer: Self-pay | Admitting: General Practice

## 2014-09-28 NOTE — Telephone Encounter (Signed)
Patient called and left message stating she was seen yesterday for a 3 week follow up after her tubal. Patient states she was given a return to work note but the date is wrong. The letter states 10/10 and it is supposed to be 10/1. Called patient stating I am returning her phone call. Asked patient how long Dr Macon Large stated she could be out. Patient states 6 weeks. Told patient that 6 weeks from her surgery is technically 10/8 and my guess is that Dr Macon Large put 10/10 thinking that most people work M-F, but if she would like we can change it to 10/9. Told patient she could come by and pick that letter up. Patient verbalized understanding and had no other questions

## 2014-10-03 ENCOUNTER — Telehealth: Payer: Self-pay | Admitting: *Deleted

## 2014-10-03 NOTE — Telephone Encounter (Addendum)
Pt left message yesterday on voice mail stating that she recently had surgery on 8/27 for a ruptured ectopic pregnancy. Her Rt fallopian tube was removed. She further stated that she had a normal cycle on 8/29 and is now 9 days from expecting her next period. She has started having Rt sided pain and wants to know if this is normal or if she needs an ultrasound. I returned pt's call and advised that she take ibuprofen 600 mg every 6 hrs for the next 24-48 hours. She may also try using a heating pad. This pain may be from ovulation and if so, should not last much longer. If her pain becomes more severe she should go to [facility] for evaluation.  Pt voiced understanding of all information and instructions.

## 2014-10-05 ENCOUNTER — Telehealth: Payer: Self-pay | Admitting: Family Medicine

## 2014-10-05 NOTE — Telephone Encounter (Signed)
Janet Hughes is asking to have the dx of "Tobacco abuse" removed from her problem list.  Haven't used tobacco products in 2 yrs.

## 2014-11-09 ENCOUNTER — Ambulatory Visit (INDEPENDENT_AMBULATORY_CARE_PROVIDER_SITE_OTHER): Payer: Medicaid Other | Admitting: Family Medicine

## 2014-11-09 ENCOUNTER — Encounter: Payer: Self-pay | Admitting: Family Medicine

## 2014-11-09 ENCOUNTER — Other Ambulatory Visit (HOSPITAL_COMMUNITY)
Admission: RE | Admit: 2014-11-09 | Discharge: 2014-11-09 | Disposition: A | Discharge status: Home / Self Care | Payer: Medicaid Other | Source: Ambulatory Visit | Attending: Family Medicine | Admitting: Family Medicine

## 2014-11-09 VITALS — BP 136/67 | HR 91 | Temp 98.9°F | Ht 62.0 in | Wt 129.4 lb

## 2014-11-09 DIAGNOSIS — N898 Other specified noninflammatory disorders of vagina: Secondary | ICD-10-CM

## 2014-11-09 DIAGNOSIS — N912 Amenorrhea, unspecified: Secondary | ICD-10-CM

## 2014-11-09 DIAGNOSIS — Z113 Encounter for screening for infections with a predominantly sexual mode of transmission: Secondary | ICD-10-CM | POA: Diagnosis present

## 2014-11-09 LAB — POCT WET PREP (WET MOUNT): CLUE CELLS WET PREP WHIFF POC: NEGATIVE

## 2014-11-09 NOTE — Progress Notes (Signed)
Patient ID: Janet Hughes, female   DOB: 03-25-1980, 34 y.o.   MRN: [id number]  Subjective: CC:  Vaginal discharge  Same day appointment  HPI  Vaginal discharge for 4 days. Medications tried: none Discharge consistency: "cottage cheese" Discharge color: white Pruritic:  no Recent antibiotic use: none Sex in last month: yes Possible STD exposure:  unknown Symptoms Fever: none Dysuria: none N/V/D:  none Vaginal bleeding: none Abdomen or Pelvic pain: none Back pain: none Genital sores or ulcers:  none Rash: none Pain during sex: none Missed menstrual period: Patient states she was due to start cycle on 10/26, hasn't yet.  First day LMP 10/11/14.  Patient has a history of yeast infections and BV.  She has been successfully treated with Diflucan and Flagyl in past.  Patient reports she has noticed bilateral breast tenderness x1 week and has been more emotional than usual, stating, "I have been crying a lot."  Patient is not trying to get pregnant, but is concerned she might be.  Patient had an ectopic pregnancy in 08/2014.  Patient uses a vaginal spermicide as birth control and denies discomfort with that product.  Patient denies new sexual contacts.  Patient denies dysuria, hematuria and urinary frequency or urgency.  Concerns today include:  1. Vaginal discharge  Social History Reviewed: Uses vapes.   ROS: Per HPI   Objective: Office vital signs reviewed. BP 136/67 mmHg  Pulse 91  Temp(Src) 98.9 F (37.2 C) (Oral)  Ht  (1.575 m)  Wt 129 lb 6.4 oz (58.695 kg)  BMI 23.66 kg/m2  LMP 10/11/2014  Physical Examination:  General: Awake, alert, well nourished, NAD Cardio: RRR, S1S2 heard, no murmurs appreciated, +2 radial pulses bilaterally Pulm: CTAB, no wheezes, rhonchi or rales GU: No CVA tenderness. No suprapubic tenderness Pelvic exam:  Normal external genitalia, vulva, vagina, cervix, uterus and adnexa.  CERVIX: Normal appearing cervix without lesions,  thin white discharge noted in os.  Negative Chandelier's sign. VAGINA: vagina with normal color and thin white discharge, no lesions.  UTERUS: uterus is normal size, shape, consistency and nontender, ADNEXA: normal adnexa in size, nontender and no masses.  Assessment/ Plan: 34 y.o. female presents with vaginal discharge and history of vaginal yeast infection and BV.  Patient also concerned because her menstrual cycle is 2 days late and that is unusual for her.  Patient had a ruptured ectopic pregnancy in 08/2014 and is concerned she might be pregnant again.  Patient's wet prep negative for BV and yeast.  Urine dip pregnancy completed in clinic negative for pregnancy due to patient's concern regarding ectopic pregnancy.  HCG pregnancy test results pending.  Patient has no adnexal, abdominal or cervical tenderness - doubt PID.  Patient's urine pregnancy negative and physical exam unremarkable, doubt ectopic pregnancy.    Will follow-up with patient regarding GC/Chlamydia and HCG pregnancy tests as results indicate.  Patient informed of negative labs in clinic and follow-up communication for remaining labs.  1. Vaginal discharge - POCT Wet Prep [facility]) - Cervicovaginal ancillary only - HIV antibody (with reflex)  2. Amenorrhea - hCG, quantitative, pregnancy  Follow-up:  Patient will be notified of lab results from today's visit.  Patient instructed to follow-up with PCP if vaginal discharge does not improve or stop within next 7-10 days.  Also instructed to follow-up with PCP if HCG pregnancy or if either GC/Chlamydia tests are positive.    Nelly Rout, NP [facility]    Upper Level Addendum:  A/P  1. Vaginal DC >> described thick, white, cottage cheese-like DC. Not consistent w/ what was seen on exam. Wet mount neg. Other labs sent and pending. No indication for abx/antifungal treatment at this time. - patient's period 48 hours late from expected timing. Has been using  spermicide for contraception. Patient requesting hCG Quant instead of urine preg test due to recent history of ectopic pregnancy and concern about another.   I have seen and evaluated this patient along with Ms. Manson PasseyBrown and reviewed the above note, making necessary revisions in red.   Kathee DeltonIan D McKeag, MD,MS,  PGY2 11/11/2014 7:40 PM

## 2014-11-09 NOTE — Patient Instructions (Signed)
It was a pleasure seeing you today in our clinic. Today we discussed vaginal discharge. Here is the treatment plan we have discussed and agreed upon together:   - We did not see any significant evidence of infection at this time. We were able to send out labs and we will keep you informed on the results when they return. - At this time I do not believe it is necessary for any antibiotics or antifungal treatment. - Try to consume at least 2 servings of yogurt or cottage cheese daily while on this medication and for at least 3 days after completion.

## 2014-11-10 LAB — HCG, QUANTITATIVE, PREGNANCY: HCG, BETA CHAIN, QUANT, S: 2.2 m[IU]/mL

## 2014-11-10 LAB — HIV ANTIBODY (ROUTINE TESTING W REFLEX): HIV: NONREACTIVE

## 2014-11-11 DIAGNOSIS — N898 Other specified noninflammatory disorders of vagina: Secondary | ICD-10-CM

## 2014-11-11 HISTORY — DX: Other specified noninflammatory disorders of vagina: N89.8

## 2014-11-12 LAB — CERVICOVAGINAL ANCILLARY ONLY
CHLAMYDIA, DNA PROBE: NEGATIVE
NEISSERIA GONORRHEA: NEGATIVE

## 2014-11-15 ENCOUNTER — Encounter: Payer: Self-pay | Admitting: Family Medicine

## 2014-11-15 ENCOUNTER — Telehealth: Payer: Self-pay | Admitting: Family Medicine

## 2014-11-15 NOTE — Telephone Encounter (Signed)
Needs test results 

## 2014-11-15 NOTE — Telephone Encounter (Signed)
Patient informed of negative test results, expressed understanding.

## 2015-01-15 ENCOUNTER — Ambulatory Visit: Payer: Medicaid Other | Admitting: Family Medicine

## 2015-01-21 ENCOUNTER — Ambulatory Visit: Payer: Medicaid Other | Admitting: Family Medicine

## 2015-02-05 ENCOUNTER — Ambulatory Visit (INDEPENDENT_AMBULATORY_CARE_PROVIDER_SITE_OTHER): Payer: Medicaid Other | Admitting: Family Medicine

## 2015-02-05 ENCOUNTER — Encounter: Payer: Self-pay | Admitting: Family Medicine

## 2015-02-05 VITALS — BP 140/75 | HR 106 | Temp 97.8°F | Ht 62.0 in | Wt 129.5 lb

## 2015-02-05 DIAGNOSIS — G4700 Insomnia, unspecified: Secondary | ICD-10-CM

## 2015-02-05 DIAGNOSIS — F900 Attention-deficit hyperactivity disorder, predominantly inattentive type: Secondary | ICD-10-CM | POA: Diagnosis present

## 2015-02-05 DIAGNOSIS — D62 Acute posthemorrhagic anemia: Secondary | ICD-10-CM | POA: Diagnosis not present

## 2015-02-05 LAB — COMPLETE METABOLIC PANEL WITH GFR
ALBUMIN: 4.3 g/dL (ref 3.6–5.1)
ALK PHOS: 56 U/L (ref 33–115)
ALT: 16 U/L (ref 6–29)
AST: 20 U/L (ref 10–30)
BILIRUBIN TOTAL: 0.5 mg/dL (ref 0.2–1.2)
BUN: 14 mg/dL (ref 7–25)
CO2: 28 mmol/L (ref 20–31)
Calcium: 9.7 mg/dL (ref 8.6–10.2)
Chloride: 103 mmol/L (ref 98–110)
Creat: 0.83 mg/dL (ref 0.50–1.10)
Glucose, Bld: 103 mg/dL — ABNORMAL HIGH (ref 65–99)
Potassium: 4.9 mmol/L (ref 3.5–5.3)
Sodium: 139 mmol/L (ref 135–146)
TOTAL PROTEIN: 7.1 g/dL (ref 6.1–8.1)

## 2015-02-05 LAB — CBC
HCT: 40.6 % (ref 36.0–46.0)
HEMOGLOBIN: 13.3 g/dL (ref 12.0–15.0)
MCH: 28.9 pg (ref 26.0–34.0)
MCHC: 32.8 g/dL (ref 30.0–36.0)
MCV: 88.3 fL (ref 78.0–100.0)
MPV: 9.5 fL (ref 8.6–12.4)
PLATELETS: 251 10*3/uL (ref 150–400)
RBC: 4.6 MIL/uL (ref 3.87–5.11)
RDW: 14.5 % (ref 11.5–15.5)
WBC: 4.9 10*3/uL (ref 4.0–10.5)

## 2015-02-05 MED ORDER — AMPHETAMINE-DEXTROAMPHETAMINE 20 MG PO TABS: 10.0000 mg | ORAL_TABLET | Freq: Two times a day (BID) | ORAL | Status: DC | PRN

## 2015-02-05 MED ORDER — ALPRAZOLAM 0.5 MG PO TABS: 0.5000 mg | ORAL_TABLET | Freq: Every evening | ORAL | Status: DC | PRN

## 2015-02-06 ENCOUNTER — Telehealth: Payer: Self-pay | Admitting: *Deleted

## 2015-02-06 NOTE — Telephone Encounter (Signed)
-----   Message from Janet Sander, MD sent at 02/05/2015  9:55 PM EST ----- Please inform patient that her blood work was all normal. The anemia and low platelets after her surgery has completely resolved. Thanks!

## 2015-02-06 NOTE — Telephone Encounter (Signed)
Pt informed. Winslow Verrill, CMA  

## 2015-02-07 NOTE — Progress Notes (Signed)
Subjective:   Janet Hughes is a 35 y.o. female with a history of depression and anxiety here for f/u anxiety and insomnia.  Pt reports anxiety symptoms much better since out of school, no needs for meds at this time per her.  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 She sometimes takes melatonin which usually works well but not always. When that isn't enough she uses xanax which works very well for her and doesn't leave her groggy the next morning like trazodone. She usually only takes 1/4-1/2 tab of xanax at a time and takes 2 tablets a month on average.  She is also on adderal for ADHD which has been stable and works well for her. She takes , usually only once daily, but has been prescribed  bid in the past. She reports this helps her focus at work and also seems to help her mood so she would like to continue.   On review of her prior labs she had anemia and thrombocytopenia in the setting of ruptured fallopian tube and surgery. She now feels well and has no dizziness, lightheadedness, weakness, fatigue, easy bruising, nose or gum bleeds.  Review of Systems:  Per HPI. All other systems reviewed and are negative.   PMH, PSH, Medications, Allergies, and FmHx reviewed and updated in EMR.  Social History: current smoker  Objective:  BP 140/75 mmHg  Pulse 106  Temp(Src) 97.8 F (36.6 C) (Oral)  Ht  (1.575 m)  Wt 129 lb 8 oz (58.741 kg)  BMI 23.68 kg/m2  LMP 01/27/2015  Gen:  35 y.o. female in NAD HEENT: NCAT, MMM, EOMI, PERRL, anicteric sclerae CV: RRR, no MRG, no JVD Resp: Non-labored, CTAB, no wheezes noted Abd: Soft, NTND, BS present, no guarding or organomegaly Ext: WWP, no edema MSK: Full ROM, strength intact Neuro: Alert and oriented, speech normal      Chemistry      Component Value Date/Time   NA 139 02/05/2015 1047   K 4.9  02/05/2015 1047   CL 103 02/05/2015 1047   CO2 28 02/05/2015 1047   BUN 14 02/05/2015 1047   CREATININE 0.83 02/05/2015 1047   CREATININE 0.75 09/08/2014 0510      Component Value Date/Time   CALCIUM 9.7 02/05/2015 1047   ALKPHOS 56 02/05/2015 1047   AST 20 02/05/2015 1047   ALT 16 02/05/2015 1047   BILITOT 0.5 02/05/2015 1047      Lab Results  Component Value Date   WBC 4.9 02/05/2015   HGB 13.3 02/05/2015   HCT 40.6 02/05/2015   MCV 88.3 02/05/2015   PLT 251 02/05/2015   Lab Results  Component Value Date   TSH 1.015 12/29/2011   No results found for: HGBA1C Assessment & Plan:     Janet Hughes is a 35 y.o. female here for insomnia  Acute blood loss anemia Asymptomatic, will repeat CBC to ensure postop anemia and thrombocytopenia are resolved  ADHD (attention deficit hyperactivity disorder) Stable, psych records reviewed previously - gave 3 month supply of adderall, f/u in 3 months    Insomnia Patient has rotating call schedule (works as RT), has trouble sleeping during the day, usually uses melatonin but occasionally needs something stronger, felt "like a zombie" on trazodone even low dose - stop trazodone - continue xanax prn insomnia as this works well for her  and she takes very low dose rarely, no concerns for abuse or diversion       Beverely Low, MD, MPH [facility] PGY-3 02/07/2015 10:27 AM

## 2015-02-07 NOTE — Assessment & Plan Note (Signed)
Asymptomatic, will repeat CBC to ensure postop anemia and thrombocytopenia are resolved

## 2015-02-07 NOTE — Assessment & Plan Note (Signed)
Patient has rotating call schedule (works as RT), has trouble sleeping during the day, usually uses melatonin but occasionally needs something stronger, felt "like a zombie" on trazodone even low dose - stop trazodone - continue xanax prn insomnia as this works well for her and she takes very low dose rarely, no concerns for abuse or diversion

## 2015-02-07 NOTE — Assessment & Plan Note (Signed)
Stable, psych records reviewed previously - gave 3 month supply of adderall, f/u in 3 months

## 2015-05-03 ENCOUNTER — Ambulatory Visit: Payer: Medicaid Other | Admitting: Family Medicine

## 2015-05-07 ENCOUNTER — Ambulatory Visit: Payer: Medicaid Other | Admitting: Family Medicine

## 2015-05-13 ENCOUNTER — Ambulatory Visit: Payer: Medicaid Other | Admitting: Family Medicine

## 2015-06-03 ENCOUNTER — Ambulatory Visit (INDEPENDENT_AMBULATORY_CARE_PROVIDER_SITE_OTHER): Payer: Medicaid Other | Admitting: Family Medicine

## 2015-06-03 ENCOUNTER — Encounter: Payer: Self-pay | Admitting: Family Medicine

## 2015-06-03 VITALS — BP 132/85 | HR 74 | Temp 98.3°F | Wt 134.9 lb

## 2015-06-03 DIAGNOSIS — F900 Attention-deficit hyperactivity disorder, predominantly inattentive type: Secondary | ICD-10-CM

## 2015-06-03 DIAGNOSIS — G4700 Insomnia, unspecified: Secondary | ICD-10-CM

## 2015-06-03 MED ORDER — AMPHETAMINE-DEXTROAMPHETAMINE 20 MG PO TABS: 10.0000 mg | ORAL_TABLET | Freq: Two times a day (BID) | ORAL | Status: DC | PRN

## 2015-06-03 MED ORDER — ALPRAZOLAM 0.5 MG PO TABS: 0.5000 mg | ORAL_TABLET | Freq: Every evening | ORAL | Status: DC | PRN

## 2015-06-05 NOTE — Assessment & Plan Note (Addendum)
Patient has rotating call schedule (works as RT), has trouble sleeping during the day, usually uses melatonin but occasionally needs something stronger, felt "like a zombie" on trazodone even low dose - continue xanax prn insomnia as this works well for her and she takes very low dose rarely, no concerns for abuse or diversion

## 2015-06-05 NOTE — Progress Notes (Signed)
   Subjective:   Janet Hughes is a 35 y.o. female with a history of depression and anxiety here for f/u ADHD and insomnia.  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 She sometimes takes melatonin which usually works well but not always. When that isn't enough she uses xanax which works very well for her and doesn't leave her groggy the next morning like trazodone. She usually only takes 1/4-1/2 tab of xanax at a time and takes 2 tablets a month on average.  She is also on adderal for ADHD which has been stable and works well for her. She takes 10mg , usually only once daily, but has been prescribed 20mg  bid in the past. She reports this helps her focus at work and also seems to help her mood so she would like to continue.   Review of Systems:  Per HPI. All other systems reviewed and are negative.   PMH, PSH, Medications, Allergies, and FmHx reviewed and updated in EMR.  Social History: current smoker  Objective:  BP 132/85 mmHg  Pulse 74  Temp(Src) 98.3 F (36.8 C) (Oral)  Wt 134 lb 14.4 oz (61.19 kg)  LMP 05/30/2015 (Exact Date)  Gen:  35 y.o. female in NAD HEENT: NCAT, MMM, EOMI, PERRL, anicteric sclerae CV: RRR, no MRG, no JVD Resp: Non-labored, CTAB, no wheezes noted Abd: Soft, NTND, BS present, no guarding or organomegaly Ext: WWP, no edema MSK: Full ROM, strength intact Neuro: Alert and oriented, speech normal    Assessment & Plan:     Janet Faillizabeth B Kang is a 35 y.o. female here for insomnia  ADHD (attention deficit hyperactivity disorder) Stable, psych records reviewed previously - gave 2 month supply of adderall, f/u in 2 months for physical      Insomnia Patient has rotating call schedule (works as RT), has trouble sleeping during the day, usually uses melatonin but occasionally needs something stronger, felt "like a zombie" on trazodone even  low dose - continue xanax prn insomnia as this works well for her and she takes very low dose rarely, no concerns for abuse or diversion     Beverely LowElena Indiana Pechacek, MD, MPH [facility] PGY-3 06/05/2015 12:16 PM

## 2015-06-05 NOTE — Assessment & Plan Note (Signed)
Stable, psych records reviewed previously - gave 2 month supply of adderall, f/u in 2 months for physical

## 2015-06-27 ENCOUNTER — Ambulatory Visit (INDEPENDENT_AMBULATORY_CARE_PROVIDER_SITE_OTHER): Payer: Medicaid Other | Admitting: Family Medicine

## 2015-06-27 ENCOUNTER — Other Ambulatory Visit (HOSPITAL_COMMUNITY)
Admission: RE | Admit: 2015-06-27 | Discharge: 2015-06-27 | Disposition: A | Discharge status: Home / Self Care | Payer: Medicaid Other | Source: Ambulatory Visit | Attending: Family Medicine | Admitting: Family Medicine

## 2015-06-27 ENCOUNTER — Encounter: Payer: Self-pay | Admitting: Family Medicine

## 2015-06-27 VITALS — BP 138/75 | HR 90 | Temp 98.3°F | Resp 20 | Ht 63.0 in | Wt 133.2 lb

## 2015-06-27 DIAGNOSIS — Z0001 Encounter for general adult medical examination with abnormal findings: Secondary | ICD-10-CM

## 2015-06-27 DIAGNOSIS — N898 Other specified noninflammatory disorders of vagina: Secondary | ICD-10-CM | POA: Diagnosis not present

## 2015-06-27 DIAGNOSIS — Z113 Encounter for screening for infections with a predominantly sexual mode of transmission: Secondary | ICD-10-CM | POA: Diagnosis not present

## 2015-06-27 DIAGNOSIS — Z0000 Encounter for general adult medical examination without abnormal findings: Secondary | ICD-10-CM | POA: Diagnosis not present

## 2015-06-27 DIAGNOSIS — N63 Unspecified lump in unspecified breast: Secondary | ICD-10-CM

## 2015-06-27 DIAGNOSIS — Z3041 Encounter for surveillance of contraceptive pills: Secondary | ICD-10-CM | POA: Diagnosis not present

## 2015-06-27 DIAGNOSIS — R6889 Other general symptoms and signs: Secondary | ICD-10-CM | POA: Diagnosis not present

## 2015-06-27 HISTORY — DX: Unspecified lump in unspecified breast: N63.0

## 2015-06-27 LAB — CBC
HEMATOCRIT: 42.1 % (ref 35.0–45.0)
Hemoglobin: 13.8 g/dL (ref 11.7–15.5)
MCH: 28.9 pg (ref 27.0–33.0)
MCHC: 32.8 g/dL (ref 32.0–36.0)
MCV: 88.3 fL (ref 80.0–100.0)
MPV: 9.8 fL (ref 7.5–12.5)
Platelets: 297 10*3/uL (ref 140–400)
RBC: 4.77 MIL/uL (ref 3.80–5.10)
RDW: 13.7 % (ref 11.0–15.0)
WBC: 4.7 10*3/uL (ref 3.8–10.8)

## 2015-06-27 LAB — TSH: TSH: 0.88 mIU/L

## 2015-06-27 LAB — POCT WET PREP (WET MOUNT): Clue Cells Wet Prep Whiff POC: NEGATIVE

## 2015-06-27 MED ORDER — NORGESTIMATE-ETH ESTRADIOL 0.25-35 MG-MCG PO TABS: 1.0000 | ORAL_TABLET | Freq: Every day | ORAL | Status: DC

## 2015-06-28 LAB — LIPID PANEL
CHOL/HDL RATIO: 2.2 ratio (ref <=5.0)
CHOLESTEROL: 181 mg/dL (ref 125–200)
HDL: 84 mg/dL (ref >=46)
LDL Cholesterol: 89 mg/dL (ref <130)
Triglycerides: 38 mg/dL (ref <150)
VLDL: 8 mg/dL (ref <30)

## 2015-06-28 LAB — HIV ANTIBODY (ROUTINE TESTING W REFLEX): HIV 1&2 Ab, 4th Generation: NONREACTIVE

## 2015-06-28 LAB — BASIC METABOLIC PANEL
BUN: 13 mg/dL (ref 7–25)
CALCIUM: 9.4 mg/dL (ref 8.6–10.2)
CHLORIDE: 105 mmol/L (ref 98–110)
CO2: 25 mmol/L (ref 20–31)
CREATININE: 0.86 mg/dL (ref 0.50–1.10)
Glucose, Bld: 104 mg/dL — ABNORMAL HIGH (ref 65–99)
Potassium: 3.8 mmol/L (ref 3.5–5.3)
SODIUM: 138 mmol/L (ref 135–146)

## 2015-06-28 LAB — RPR

## 2015-07-01 ENCOUNTER — Telehealth: Payer: Self-pay | Admitting: *Deleted

## 2015-07-01 LAB — GC/CHLAMYDIA PROBE AMP (~~LOC~~) NOT AT ARMC
CHLAMYDIA, DNA PROBE: NEGATIVE
NEISSERIA GONORRHEA: NEGATIVE

## 2015-07-01 NOTE — Assessment & Plan Note (Signed)
Small lump at 3 O'Clock on right breast at the base of implant, inferior to axilla, mobile and soft on exam, present for a few month - discussed appropriate imaging with breast center given implants and ordered ultrasound and diagnostic mammogram

## 2015-07-01 NOTE — Progress Notes (Signed)
   Subjective:   Janet Hughes is a 35 y.o. female with a history of ADHD, anxiety here for annual and breast lump  Pt reports having an abnormal pap in the past (ASCUS-H per chart review) but normal for several years and most recent last year was negative cotest.   Otherwise health maintenance UTD. Request STI screening and resumption on OCPs as in a new relationship.  Breast lump: - present for a few months - very small and hides under her implant - no pain, hasn't changed. - no skin changes or nipple discharge - no family history of breast cancer  Review of Systems:  Per HPI. All other systems reviewed and are negative.   PMH, PSH, Medications, Allergies, and FmHx reviewed and updated in EMR.  Social History: current smoker  Objective:  BP 138/75 mmHg  Pulse 90  Temp(Src) 98.3 F (36.8 C) (Oral)  Resp 20  Ht 5\' 3"  (1.6 m)  Wt 133 lb 3.2 oz (60.419 kg)  BMI 23.60 kg/m2  SpO2 100%  LMP 06/22/2015  Gen:  35 y.o. female in NAD HEENT: NCAT, MMM, EOMI, PERRL, anicteric sclerae CV: RRR, no MRG, no JVD Resp: Non-labored, CTAB, no wheezes noted Abd: Soft, NTND, BS present, no guarding or organomegaly Pelvic: Normal canal and cervix except for copious brown-tinged discharge, no CMT, mild L adnexal tenderness without fullness Breast: ~344mm soft mobile lump at 3 O'clock on right breast just lateral to implant and inferior to axilla, otherwise normal without skin changes or nipple discharge Neuro: Alert and oriented, speech normal      Chemistry      Component Value Date/Time   NA 138 06/27/2015 1637   K 3.8 06/27/2015 1637   CL 105 06/27/2015 1637   CO2 25 06/27/2015 1637   BUN 13 06/27/2015 1637   CREATININE 0.86 06/27/2015 1637   CREATININE 0.75 09/08/2014 0510      Component Value Date/Time   CALCIUM 9.4 06/27/2015 1637   ALKPHOS 56 02/05/2015 1047   AST 20 02/05/2015 1047   ALT 16 02/05/2015 1047   BILITOT 0.5 02/05/2015 1047      Lab Results  Component  Value Date   WBC 4.7 06/27/2015   HGB 13.8 06/27/2015   HCT 42.1 06/27/2015   MCV 88.3 06/27/2015   PLT 297 06/27/2015   Lab Results  Component Value Date   TSH 0.88 06/27/2015   Assessment & Plan:     Janet Faillizabeth B Bines is a 35 y.o. female here for annual and breast lump  Breast lump in female Small lump at 3 O'Clock on right breast at the base of implant, inferior to axilla, mobile and soft on exam, present for a few month - discussed appropriate imaging with breast center given implants and ordered ultrasound and diagnostic mammogram  Routine health maintenance Presents for annual, wants STI screening, pap cotest done last year so not recommended for 4 more years - check GC/CT, HIV, RPR, CBC, BMP, lipids, TSH  Vaginal discharge Not complaining of discharge or itching but copious brown discharge on pelvic exam, likely related to recent menses - normal wet prep - gc/ct sent    Beverely LowElena Adamo, MD, MPH [facility] PGY-3 07/01/2015 10:00 AM

## 2015-07-01 NOTE — Assessment & Plan Note (Signed)
Not complaining of discharge or itching but copious brown discharge on pelvic exam, likely related to recent menses - normal wet prep - gc/ct sent

## 2015-07-01 NOTE — Telephone Encounter (Signed)
Patient called inquiring about labs from 06-27-15.  Patient was informed of results and will forward to MD to see if there needs to be any treatment for her wet prep.  If patient doesn't answer it is ok to LM on her number listed. Lauriel Helin,CMA

## 2015-07-01 NOTE — Assessment & Plan Note (Signed)
Presents for annual, wants STI screening, pap cotest done last year so not recommended for 4 more years - check GC/CT, HIV, RPR, CBC, BMP, lipids, TSH

## 2015-07-02 NOTE — Progress Notes (Signed)
Quick Note:  Please inform patient of normal lab results. Thanks! ______

## 2015-07-03 NOTE — Telephone Encounter (Signed)
-----   Message from Abram SanderElena M Adamo, MD sent at 07/02/2015  7:28 AM EDT ----- Please inform patient of normal lab results. Thanks!

## 2015-07-03 NOTE — Telephone Encounter (Signed)
Patient informed of normal labs. Expressed understanding.

## 2015-07-05 ENCOUNTER — Other Ambulatory Visit: Payer: Self-pay | Admitting: Family Medicine

## 2015-07-11 ENCOUNTER — Ambulatory Visit
Admission: RE | Admit: 2015-07-11 | Discharge: 2015-07-11 | Disposition: A | Discharge status: Home / Self Care | Payer: Medicaid Other | Source: Ambulatory Visit | Attending: Family Medicine | Admitting: Family Medicine

## 2015-07-11 DIAGNOSIS — N63 Unspecified lump in unspecified breast: Secondary | ICD-10-CM

## 2015-07-22 ENCOUNTER — Ambulatory Visit (INDEPENDENT_AMBULATORY_CARE_PROVIDER_SITE_OTHER): Payer: Medicaid Other | Admitting: Family Medicine

## 2015-07-22 VITALS — BP 125/77 | HR 71 | Temp 98.5°F | Wt 135.2 lb

## 2015-07-22 DIAGNOSIS — R21 Rash and other nonspecific skin eruption: Secondary | ICD-10-CM | POA: Insufficient documentation

## 2015-07-22 HISTORY — DX: Rash and other nonspecific skin eruption: R21

## 2015-07-22 MED ORDER — CEPHALEXIN 500 MG PO CAPS: 500.0000 mg | ORAL_CAPSULE | Freq: Two times a day (BID) | ORAL | Status: DC

## 2015-07-22 NOTE — Assessment & Plan Note (Signed)
Currently of unknown etiology. General appearance and acute nature makes an infectious source likely at this time. Patient has a history of acne, but this does not appear to have these qualities. Some slight concern for a staph/streptococcal infection at its early stages. - Treating with 7 days of Keflex twice a day. - I've asked patient to follow-up at the end of the week to reassess/reevaluate >> patient informed to cancel visit if she has significant improvement at that time.  Next: Etiology unclear but next May include labs and topical steroids. Patient did recently start OCPs. I would strongly consider asking patient to hold these if rash does not begin to clear up after 2-3 days of antibiotic therapy.

## 2015-07-22 NOTE — Progress Notes (Signed)
RASH Sunday morning had some white pustules. This morning was worse. Noticed some swelling around her eyes. No new exposures to her knowledge. No significant time spent outside. No pet exposures.  Had rash for 2 days. Location: perioral and chin Medications tried: none Similar rash in past: no New medications or antibiotics: none Tick, Insect or new pet exposure: no Recent travel: the beach, 2-3 weeks ago New detergent or soap: no Immunocompromised: no  Symptoms Itching: mild Pain over rash: no Feeling ill all over: no Fever: no Mouth sores: no Face or tongue swelling: mild swelling around her eyes this AM Trouble breathing: no Joint swelling or pain: no  Review of Symptoms - see HPI PMH - Smoking status noted.    CC, SH/smoking status, and VS noted  Objective: BP 125/77 mmHg  Pulse 71  Temp(Src) 98.5 F (36.9 C) (Oral)  Wt 135 lb 3.2 oz (61.326 kg)  SpO2 100%  LMP 06/26/2015 (Exact Date) Gen: NAD, alert, cooperative, and pleasant. HEENT: NCAT, EOMI, PERRL CV: RRR, no murmur Resp: CTAB, no wheezes, non-labored Integument: Numerous small pustules noted throughout the surface of the face and neck bilaterally. Some evidence of mild erythema around the affected regions. No inclusion of oral mucosa. Rash became more itchy with palpation. [See photos below]       Assessment and plan:  Rash and nonspecific skin eruption Currently of unknown etiology. General appearance and acute nature makes an infectious source likely at this time. Patient has a history of acne, but this does not appear to have these qualities. Some slight concern for a staph/streptococcal infection at its early stages. - Treating with 7 days of Keflex twice a day. - I've asked patient to follow-up at the end of the week to reassess/reevaluate >> patient informed to cancel visit if she has significant improvement at that time.  Next: Etiology unclear but next May include labs and topical steroids.  Patient did recently start OCPs. I would strongly consider asking patient to hold these if rash does not begin to clear up after 2-3 days of antibiotic therapy.     Meds ordered this encounter  Medications  . cephALEXin (KEFLEX) 500 MG capsule    Sig: Take 1 capsule (500 mg total) by mouth 2 (two) times daily.    Dispense:  14 capsule    Refill:  0     Kathee DeltonIan D Kimyata Milich, MD,MS,  PGY3 07/22/2015 6:48 PM

## 2015-07-22 NOTE — Patient Instructions (Signed)
Rash Treatment - you should: - Take the prescribed antibiotic, Keflex, twice a day for the next 7 days. - Keep this area clean and dry.  You should be better in: 3-5 days Call us or go to the ER if you have high fever, sores in your mouth, feel very ill or the rash is a lot worse. Come back to see us at the end of the week to be rechecked.

## 2015-07-24 ENCOUNTER — Encounter: Payer: Self-pay | Admitting: Family Medicine

## 2015-07-24 ENCOUNTER — Telehealth: Payer: Self-pay | Admitting: *Deleted

## 2015-07-24 NOTE — Telephone Encounter (Signed)
Left message for patient that letter was read to be picked up.

## 2015-07-24 NOTE — Telephone Encounter (Signed)
Patient seen by Dr. Wende MottMcKeag earlier this week for rash. Patient states she needs a note stating she's not contagious in order to return to work. Will forward to Dr. Wende MottMcKeag.

## 2015-07-24 NOTE — Telephone Encounter (Signed)
I won't be in the clinic today, I have written a note and it should be saved in this patient's "letters" section. Can someone print this out and leave it for patient to pick up?

## 2015-07-26 ENCOUNTER — Telehealth: Payer: Self-pay | Admitting: *Deleted

## 2015-07-26 NOTE — Telephone Encounter (Signed)
Pt calling stating that the letter you gave her has the wrong dates. She was seen in clinic on 07/22/15 and she was told she could go back to work on 07/29/15. Please advise and fax to [phone]. Journee Kohen Bruna PotterBlount, CMA

## 2015-07-26 NOTE — Telephone Encounter (Signed)
Changed letter and faxed as requested.  MD is postcall . Fleeger, Maryjo RochesterJessica Dawn, CMA

## 2015-09-20 ENCOUNTER — Ambulatory Visit (INDEPENDENT_AMBULATORY_CARE_PROVIDER_SITE_OTHER): Payer: Medicaid Other | Admitting: Family Medicine

## 2015-09-20 ENCOUNTER — Encounter: Payer: Self-pay | Admitting: Family Medicine

## 2015-09-20 DIAGNOSIS — G4700 Insomnia, unspecified: Secondary | ICD-10-CM | POA: Diagnosis present

## 2015-09-20 DIAGNOSIS — F900 Attention-deficit hyperactivity disorder, predominantly inattentive type: Secondary | ICD-10-CM

## 2015-09-20 MED ORDER — AMPHETAMINE-DEXTROAMPHETAMINE 20 MG PO TABS: 10.0000 mg | ORAL_TABLET | Freq: Two times a day (BID) | ORAL | 0 refills | Status: DC | PRN

## 2015-09-20 MED ORDER — ALPRAZOLAM 0.5 MG PO TABS: 0.5000 mg | ORAL_TABLET | Freq: Every evening | ORAL | 2 refills | Status: DC | PRN

## 2015-09-20 NOTE — Patient Instructions (Signed)
You were seen in clinic today for prescription refills and for follow up on your anxiety and ADHD.  You were given refills for the next 3 months. As this approaches and you need further refills please call the clinic and I can have them refilled. You will need to come and pick up your prescriptions.  You can follow up with me in 6 months or earlier if needed.  Thank you for allowing us to participate in your care!

## 2015-09-20 NOTE — Progress Notes (Signed)
   Subjective:   Patient ID: Janet Hughes    DOB: 06/01/1980, 35 y.o. female   MRN: [id number]  CC: medication refills  HPI: Janet Hughes is a 35 y.o. female who presents to clinic today with her daughter for medication refill for her anxiety and ADHD. No other concerns at current time. She states that symptoms are well controlled on current regimen and it has been working for her.  She has been taking xanax to help with her insomnia and anxiety.  Patient opted to not have flu shot today.  ROS: See HPI for pertinent ROS.  PMFSH: Pertinent past medical, surgical, family, and social history were reviewed and updated as appropriate. Smoking status reviewed.  Medications reviewed. Current Outpatient Prescriptions  Medication Sig Dispense Refill  . ALPRAZolam (XANAX) 0.5 MG tablet Take 1 tablet (0.5 mg total) by mouth at bedtime as needed for sleep. 30 tablet 2  . amphetamine-dextroamphetamine (ADDERALL) 20 MG tablet Take 0.5 tablets (10 mg total) by mouth 2 (two) times daily as needed. 30 tablet 0  . norgestimate-ethinyl estradiol (ORTHO-CYCLEN,SPRINTEC,PREVIFEM) 0.25-35 MG-MCG tablet Take 1 tablet by mouth daily. 1 Package 11   No current facility-administered medications for this visit.     Objective:   BP 126/81   Pulse 75   Temp 99.4 F (37.4 C) (Oral)   Wt 133 lb 6.4 oz (60.5 kg)   BMI 23.63 kg/m  Vitals and nursing note reviewed.  General: alert, well developed, in no acute distress HEENT: NCAT, MMM Neck: supple, non-tender without LAD CV: RRR, no m/r/g Lungs: CTA B/L  Abdomen: soft, NT, ND, no masses or organomegaly palpable, +bs Skin: warm, dry, no rashes or lesions, cap refill < 2 seconds Extremities: warm and well perfused, normal tone Psych: mood appropriate  Assessment & Plan:   35 y/o F presenting to clinic for medication refills.   #ADHD -taking Aderall 20 mg daily -pt states she is doing well on this dose -discuss discontinuing adderall  eventually if well controlled, may no longer be necessary -3 mo prescription refilled for now  #Anxiety -Xanax 0.5 mg prn -pt reports some anxiety and requires it from time to time for insomnia -will continue to follow  Follow up in: 3 months or sooner if needed.  Freddrick MarchYashika Chariti Havel, MD [facility], PGY-1 09/20/2015 6:00 PM

## 2015-11-01 ENCOUNTER — Ambulatory Visit: Payer: Medicaid Other

## 2016-02-18 ENCOUNTER — Telehealth: Payer: Self-pay | Admitting: Family Medicine

## 2016-02-18 DIAGNOSIS — F900 Attention-deficit hyperactivity disorder, predominantly inattentive type: Secondary | ICD-10-CM

## 2016-02-18 NOTE — Telephone Encounter (Signed)
Will need refill soon on her adderal. Doesn't remember if she has to make an appt to get the refill. Please advise

## 2016-02-24 NOTE — Telephone Encounter (Signed)
Please advise patient that I will refill her Adderal once I see the request.  She does not need to be seen in clinic.  Thanks

## 2016-02-25 ENCOUNTER — Telehealth: Payer: Self-pay

## 2016-02-25 ENCOUNTER — Other Ambulatory Visit: Payer: Self-pay | Admitting: Family Medicine

## 2016-02-25 DIAGNOSIS — F900 Attention-deficit hyperactivity disorder, predominantly inattentive type: Secondary | ICD-10-CM

## 2016-02-25 MED ORDER — AMPHETAMINE-DEXTROAMPHETAMINE 20 MG PO TABS: 10.0000 mg | ORAL_TABLET | Freq: Two times a day (BID) | ORAL | 0 refills | Status: DC | PRN

## 2016-02-25 NOTE — Telephone Encounter (Signed)
Needs rx for Aderall left up front for pick up. Please call [phone] when ready. Janet Hughes, CMA

## 2016-02-25 NOTE — Telephone Encounter (Signed)
Left message on voicemail with message from MD. Asked that she either give or a call or have her pharmacy call us when she is due for refill.

## 2016-02-25 NOTE — Telephone Encounter (Signed)
Rx printed and left at front desk.  Please call patient and tell her it is ready for pick up. Thanks

## 2016-02-26 NOTE — Telephone Encounter (Signed)
Patient informed. 

## 2016-03-11 ENCOUNTER — Telehealth: Payer: Self-pay | Admitting: Family Medicine

## 2016-03-11 NOTE — Telephone Encounter (Signed)
Pt RX for adderal was only for one month.  Previously it had been for 3 months. Pt is wondering why? Please advise

## 2016-03-17 ENCOUNTER — Other Ambulatory Visit: Payer: Self-pay | Admitting: Family Medicine

## 2016-03-17 DIAGNOSIS — F900 Attention-deficit hyperactivity disorder, predominantly inattentive type: Secondary | ICD-10-CM

## 2016-03-31 ENCOUNTER — Other Ambulatory Visit: Payer: Self-pay | Admitting: Family Medicine

## 2016-03-31 DIAGNOSIS — F900 Attention-deficit hyperactivity disorder, predominantly inattentive type: Secondary | ICD-10-CM

## 2016-03-31 MED ORDER — AMPHETAMINE-DEXTROAMPHETAMINE 20 MG PO TABS: 10.0000 mg | ORAL_TABLET | Freq: Two times a day (BID) | ORAL | 0 refills | Status: DC | PRN

## 2016-03-31 NOTE — Telephone Encounter (Signed)
I have printed a prescription with 90-day supply and left it at the front desk.  Unable to send it electronically as it is controlled.  Please inform the patient she can come and pick it up when able.

## 2016-04-02 NOTE — Telephone Encounter (Signed)
Left message on voicemail that rx was ready to be picked up. 

## 2016-04-03 ENCOUNTER — Telehealth: Payer: Self-pay | Admitting: *Deleted

## 2016-04-03 NOTE — Telephone Encounter (Signed)
[facility] Author[facility] for D-amphetamine salt combo 20 mg. Medication is preferred, but Medicaid will not cover a 90 day supply. Medicaid will only cover a 30 day supply.  Call reference ID: [id number]. [place] [employer] representative: Deanne.  Clovis PuMartin, Tamika L, RN

## 2016-04-09 ENCOUNTER — Other Ambulatory Visit: Payer: Self-pay | Admitting: Family Medicine

## 2016-04-09 DIAGNOSIS — F900 Attention-deficit hyperactivity disorder, predominantly inattentive type: Secondary | ICD-10-CM

## 2016-04-09 MED ORDER — AMPHETAMINE-DEXTROAMPHETAMINE 20 MG PO TABS: 10.0000 mg | ORAL_TABLET | Freq: Two times a day (BID) | ORAL | 0 refills | Status: DC

## 2016-04-09 NOTE — Telephone Encounter (Signed)
Left voice message for patient that three Rx was left up front for pickup.  Clovis PuMartin, Tamika L, RN

## 2016-04-09 NOTE — Telephone Encounter (Signed)
I have refilled Adderall and printed 3 separate prescriptions for the following 3 months. Will leave at front desk.  Please inform patient that these are available for her to come and pick up.  Thank you.

## 2016-06-09 ENCOUNTER — Other Ambulatory Visit: Payer: Self-pay | Admitting: *Deleted

## 2016-06-09 DIAGNOSIS — Z3041 Encounter for surveillance of contraceptive pills: Secondary | ICD-10-CM

## 2016-06-09 MED ORDER — NORGESTIMATE-ETH ESTRADIOL 0.25-35 MG-MCG PO TABS: 1.0000 | ORAL_TABLET | Freq: Every day | ORAL | 11 refills | Status: DC

## 2016-06-09 NOTE — Telephone Encounter (Signed)
Pt would like to get norgestimate-ethinyl estradiol refilled. ep

## 2016-06-11 ENCOUNTER — Ambulatory Visit: Payer: Medicaid Other | Admitting: Family Medicine

## 2016-07-13 ENCOUNTER — Other Ambulatory Visit: Payer: Self-pay | Admitting: *Deleted

## 2016-07-15 MED ORDER — ALPRAZOLAM 0.5 MG PO TABS: 0.5000 mg | ORAL_TABLET | Freq: Every evening | ORAL | 2 refills | Status: DC | PRN

## 2016-09-11 ENCOUNTER — Ambulatory Visit (HOSPITAL_COMMUNITY)
Admission: RE | Admit: 2016-09-11 | Discharge: 2016-09-11 | Disposition: A | Discharge status: Home / Self Care | Payer: Medicaid Other | Source: Ambulatory Visit | Attending: Internal Medicine | Admitting: Internal Medicine

## 2016-09-11 ENCOUNTER — Ambulatory Visit (INDEPENDENT_AMBULATORY_CARE_PROVIDER_SITE_OTHER): Payer: Medicaid Other | Admitting: Family Medicine

## 2016-09-11 ENCOUNTER — Encounter: Payer: Self-pay | Admitting: Family Medicine

## 2016-09-11 VITALS — BP 110/70 | HR 70 | Temp 98.3°F | Ht 63.0 in | Wt 153.0 lb

## 2016-09-11 DIAGNOSIS — F900 Attention-deficit hyperactivity disorder, predominantly inattentive type: Secondary | ICD-10-CM

## 2016-09-11 DIAGNOSIS — R002 Palpitations: Secondary | ICD-10-CM

## 2016-09-11 MED ORDER — AMPHETAMINE-DEXTROAMPHETAMINE 20 MG PO TABS: 10.0000 mg | ORAL_TABLET | Freq: Two times a day (BID) | ORAL | 0 refills | Status: DC

## 2016-09-11 NOTE — Progress Notes (Signed)
Subjective:   Patient ID: Janet Hughes    DOB: 05/28/1980, 36 y.o. female   MRN: [id number]  CC: medication refill, feels like chest is pounding   HPI: Janet Faillizabeth B Colford is a 36 y.o. female who presents to clinic today for symptoms of chest pounding.  Chest pounding  -Denies chest pain and discomfort but can feel bounding pulse in her neck and chest sometimes  -Works as a Buyer, retailrespiratory therapist and experiences many of her symptoms when she is running codes or under high stress, however episodes will also occur while she is sitting still and doing nothing  -Has been noticing some shortness of breath and palpitations for about 3 months off and on  -uses one pillow to sleep, has not had SOB overnight when laying flat in bed  -recently gained 15-20 lbs between April and now but denies swelling  -former smoker - quit in 2004, prior to that was smoking 1/2 ppd x about 17 years and she thinks this is catching up to her  -been on Aderral x 3 years for ADHD and has not previously experienced any of this tachycardia   ROS: See HPI for pertinent ROS. PMFSH: Pertinent past medical, surgical, family, and social history were reviewed and updated as appropriate. Smoking status reviewed. Medications reviewed.   Objective:   BP 110/70   Pulse 70   Temp 98.3 F (36.8 C) (Oral)   Ht 5\' 3"  (1.6 m)   Wt 153 lb (69.4 kg)   SpO2 99%   BMI 27.10 kg/m  Vitals and nursing note reviewed.  General: pleasant 36 yo female, NAD, well appearing HEENT: NCAT, MMM, o/p clear  Neck: supple, no JVD  CV: RRR no MRG, 2+ pedal pulses  Lungs: CTAB, breathing is comfortable and non-labored, no crackles or rales appreciated  Abdomen: soft, NTND, +bs  Skin: warm, dry  Extremities: no edema or tenderness noted   Assessment & Plan:   Palpitations No history of murmur or heart irregularities; new onset of palpitations and subjective sensation of heart pounding x 3 months.  -EKG in office today shows possible  ectopic atrial rhythm and normal rate of 64.  Patient currently asymptomatic.   -BMET, CBC and TSH labs ordered to check for possible causes ie anemia, thyroid, electrolyte imbalance -Patient does have history of anxiety which had improved as of graduating school; no longer taking medications.  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   -Limit caffeine intake  -Okay to continue Adderall for now; her dose has been unchanged for 3 years so less likely that it would cause new symptoms at this time  -Will place standing order for echocardiogram to check for structural changes in the heart  -If everything is normal, may want to consider Holter monitor or low-dose beta blocker  -Referral placed to cardiology for their opinion    ADHD (attention deficit hyperactivity disorder) Refills provided today.  Patient ok to continue Adderall for now; will plan to see Cardiology for new symptoms of palpitations.   Orders Placed This Encounter  Procedures  . TSH  . CBC  . Basic Metabolic Panel  . Ambulatory referral to Cardiology    Referral Priority:   Routine    Referral Type:   Consultation    Referral Reason:   Specialty Services Required  Requested Specialty:   Cardiology    Number of Visits Requested:   1  . EKG 12-Lead  . ECHOCARDIOGRAM COMPLETE    Standing Status:   Future    Standing Expiration Date:   12/11/2017    Order Specific Question:   Where should this test be performed    Answer:   [place]    Order Specific Question:   Perflutren DEFINITY (image enhancing agent) should be administered unless hypersensitivity or allergy exist    Answer:   Administer Perflutren    Order Specific Question:   Expected Date:    Answer:   1 week   Meds ordered this encounter  Medications  .  amphetamine-dextroamphetamine (ADDERALL) 20 MG tablet    Sig: Take 0.5 tablets (10 mg total) by mouth 2 (two) times daily.    Dispense:  30 tablet    Refill:  0    Do not refill before 5/28.  This is prescription 3 of 3.   Follow up: 1 month   Freddrick March, MD [facility], PGY-2 09/15/2016 11:05 AM

## 2016-09-11 NOTE — Patient Instructions (Addendum)
It was nice seeing you today.  You were seen in clinic for your palpitations and shortness of breath.  We did an EKG which showed a possible ectopic atrial rhythm.  I have also ordered some labs to work this up further including a CBC to check for anemia and a TSH to check your thyroid.  I will let you know of these when they result.   In the meantime, I have scheduled you to have an Echocardiogram on 09/15/16 at [facility].   I have also referred you to cardiology and you can expect a call within the next week for scheduling.   As we discussed, you can continue the Adderall for now and I have refilled this as you have been on it for several years and we have not recently made any adjustments to the dose, it is likely not what is causing your symptoms.    You can call clinic with any questions if needed.   Be well,  Janet MarchYashika Yi Haugan, MD

## 2016-09-12 LAB — CBC
HEMATOCRIT: 37.8 % (ref 34.0–46.6)
Hemoglobin: 12.5 g/dL (ref 11.1–15.9)
MCH: 28.8 pg (ref 26.6–33.0)
MCHC: 33.1 g/dL (ref 31.5–35.7)
MCV: 87 fL (ref 79–97)
PLATELETS: 272 10*3/uL (ref 150–379)
RBC: 4.34 x10E6/uL (ref 3.77–5.28)
RDW: 14.5 % (ref 12.3–15.4)
WBC: 4.3 10*3/uL (ref 3.4–10.8)

## 2016-09-12 LAB — BASIC METABOLIC PANEL
BUN/Creatinine Ratio: 16 (ref 9–23)
BUN: 14 mg/dL (ref 6–20)
CALCIUM: 9.1 mg/dL (ref 8.7–10.2)
CHLORIDE: 101 mmol/L (ref 96–106)
CO2: 24 mmol/L (ref 20–29)
Creatinine, Ser: 0.87 mg/dL (ref 0.57–1.00)
GFR calc Af Amer: 100 mL/min/{1.73_m2} (ref >59)
GFR calc non Af Amer: 87 mL/min/{1.73_m2} (ref >59)
Glucose: 93 mg/dL (ref 65–99)
POTASSIUM: 4.2 mmol/L (ref 3.5–5.2)
Sodium: 137 mmol/L (ref 134–144)

## 2016-09-12 LAB — TSH: TSH: 0.939 u[IU]/mL (ref 0.450–4.500)

## 2016-09-15 ENCOUNTER — Ambulatory Visit (HOSPITAL_COMMUNITY)
Admission: RE | Admit: 2016-09-15 | Discharge: 2016-09-15 | Disposition: A | Discharge status: Home / Self Care | Payer: Medicaid Other | Source: Ambulatory Visit | Attending: Family Medicine | Admitting: Family Medicine

## 2016-09-15 DIAGNOSIS — R002 Palpitations: Secondary | ICD-10-CM | POA: Insufficient documentation

## 2016-09-15 DIAGNOSIS — I517 Cardiomegaly: Secondary | ICD-10-CM | POA: Insufficient documentation

## 2016-09-15 HISTORY — DX: Palpitations: R00.2

## 2016-09-15 NOTE — Assessment & Plan Note (Addendum)
No history of murmur or heart irregularities; new onset of palpitations and subjective sensation of heart pounding x 3 months.  -EKG in office today shows possible ectopic atrial rhythm and normal rate of 64.  Patient currently asymptomatic.   -BMET, CBC and TSH labs ordered to check for possible causes ie anemia, thyroid, electrolyte imbalance -Patient does have history of anxiety which had improved as of graduating school; no longer taking medications.  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   -Limit caffeine intake  -Okay to continue Adderall for now; her dose has been unchanged for 3 years so less likely that it would cause new symptoms at this time  -Will place standing order for echocardiogram to check for structural changes in the heart  -If everything is normal, may want to consider Holter monitor or low-dose beta blocker  -Referral placed to cardiology for their opinion

## 2016-09-15 NOTE — Assessment & Plan Note (Addendum)
Refills provided today.  Patient ok to continue Adderall for now; will plan to see Cardiology for new symptoms of palpitations.

## 2016-09-15 NOTE — Progress Notes (Signed)
  2D Echocardiogram has been performed.  Arvil ChacoFoster, Jermiya Reichl 09/15/2016, 2:39 PM

## 2016-09-17 NOTE — Progress Notes (Signed)
Cardiology Office Note:    Date:  09/18/2016   ID:  Janet Hughes, DOB 02/12/1980, MRN [id number]  PCP:  Janet Hughes  Cardiologist:  New - Dr. Dietrich PatesPaula Hughes    Referring Hughes: Janet Hughes   Chief Complaint  Patient presents with  . Palpitations  . Shortness of Breath    History of Present Illness:    Janet Hughes is a 36 y.o. female former smoker with a hx of ADD who is being seen today for the evaluation of palpitations at the request of Janet Hughes.   [name] has noted palpitations for the last 4 mos.  She notes a skipping sensation.  This can occur at any time.  It is not necessarily assoc with activity. She notes dyspnea on exertion as well.  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  She denies chest pain, paroxysmal nocturnal dyspnea, edema, syncope.   PAD Screen 09/18/2016  Previous PAD dx? No  Previous surgical procedure? No  Pain with walking? No  Feet/toe relief with dangling? No  Painful, non-healing ulcers? No  Extremities discolored? No    Prior CV studies:   The following studies were reviewed today:  Echo 09/15/16 EF 55-60, no RWMA, normal diastolic function, mild LAE, trivial TR, PASP 26   Past Medical History:  Diagnosis Date  . Abortion, therapeutic 12/01  . ADHD (attention deficit hyperactivity disorder) 08/07/2014  . Anxiety   . Breast lump in female 06/27/2015  . Chlamydia infection    repeated infections  . 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 Dysuria 12/20/2009   Qualifier: Diagnosis of  By: Humberto SealsSaxon NP, Darl PikesSusan    . Ectopic pregnancy 09/08/2014  . HEMORRHOIDS 11/15/2007   Qualifier: Diagnosis of  By: Humberto SealsSaxon NP, Darl PikesSusan    . Insomnia 01/03/2013  . Oral contraceptive prescribed 02/09/2014  . Palpitations 09/15/2016  . Post partum depression   . Rash and nonspecific skin eruption 07/22/2015    . Reactive cervical lymphadenopathy 01/20/2011  . SORE THROAT 02/10/2010   Qualifier: Diagnosis of  By: Humberto SealsSaxon NP, Darl PikesSusan    . Tobacco abuse 04/28/2011  . UNSPECIFIED VAGINITIS AND VULVOVAGINITIS 12/20/2009   Qualifier: Diagnosis of  By: Humberto SealsSaxon NP, Darl PikesSusan    . Vaginal discharge 11/11/2014  . Vaginitis 07/27/2011  . Yeast infection 07/19/2012    Past Surgical History:  Procedure Laterality Date  . BREAST ENHANCEMENT SURGERY Bilateral 04/26/2012  . CESAREAN SECTION     2006  . COLPOSCOPY     no bx, pregnant  . LAPAROTOMY N/A 09/08/2014   Procedure: EXPLORATORY LAPAROTOMY WITH RIGHT SALPINGECTOMY;  Surgeon: Tilda BurrowJohn Ferguson V, Hughes;  Location: [facility];  Service: Gynecology;  Laterality: N/A;  Ruptured Right Ectopic Pregnancy    Current Medications: Current Meds  Medication Sig  . ALPRAZolam (XANAX) 0.5 MG tablet Take 1 tablet (0.5 mg total) by mouth at bedtime as needed for sleep.  Marland Kitchen. amphetamine-dextroamphetamine (ADDERALL) 20 MG tablet Take 0.5 tablets (10 mg total) by mouth 2 (two) times daily.  . Multiple Vitamins-Minerals (HAIR VITAMINS PO) Take 3,000 mcg by mouth 3 (three) times daily.  . norgestimate-ethinyl estradiol (ORTHO-CYCLEN,SPRINTEC,PREVIFEM) 0.25-35 MG-MCG tablet Take 1 tablet by mouth daily.     Allergies:   Patient has no known allergies.   Social History   Social History  .  Marital status: Single    Spouse name: N/A  . Number of children: 2  . Years of education: N/A   Occupational History  . Respiratory Therapist  [employer]   Social History Main Topics  . Smoking status: Former Smoker    Packs/day: 0.30    Types: Cigarettes, E-cigarettes    Quit date: 02/26/2012  . Smokeless tobacco: Never Used     Comment: uses vapor cig  . Alcohol use No  . Drug use: No  . Sexual activity: Yes    Birth control/ protection: Pill     Comment: early sexual activity   Other Topics Concern  . None   Social History Narrative   Lives with daughters Phylis Bougie and Barrie Dunker  and boyfriend. Graduation from [employer] program 5/09, back in school for radiology tech.   Resp Therapist at [employer] Hx: The patient's family history includes Anxiety disorder in her father; COPD in her maternal grandfather; Cancer in her maternal aunt; Depression in her mother; Heart failure in her maternal grandmother; Hypertension in her father and maternal grandmother; Post-traumatic stress disorder in her father. There is no history of Sudden Cardiac Death.  ROS:   Please see the history of present illness.    Review of Systems  Constitution: Positive for malaise/fatigue and weight gain.  Cardiovascular: Positive for dyspnea on exertion and irregular heartbeat.   All other systems reviewed and are negative.   EKGs/Labs/Other Test Reviewed:    EKG:  EKG is  ordered today.  The ekg ordered today demonstrates NSR with ectopic atrial rhythm, HR 67, normal axis, QTc 414 ms  Recent Labs: 09/11/2016: BUN 14; Creatinine, Ser 0.87; Hemoglobin 12.5; Platelets 272; Potassium 4.2; Sodium 137; TSH 0.939   Recent Lipid Panel Lab Results  Component Value Date/Time   CHOL 181 06/27/2015 04:37 PM   TRIG 38 06/27/2015 04:37 PM   HDL 84 06/27/2015 04:37 PM   CHOLHDL 2.2 06/27/2015 04:37 PM   LDLCALC 89 06/27/2015 04:37 PM    Physical Exam:    VS:  BP 130/62   Pulse 68   Ht  (1.6 m)   Wt 152 lb (68.9 kg)   SpO2 98%   BMI 26.93 kg/m     Wt Readings from Last 3 Encounters:  09/18/16 152 lb (68.9 kg)  09/11/16 153 lb (69.4 kg)  09/20/15 133 lb 6.4 oz (60.5 kg)     Physical Exam  Constitutional: She is oriented to person, place, and time. She appears well-developed and well-nourished. No distress.  HENT:  Head: Normocephalic and atraumatic.  Eyes: No scleral icterus.  Neck: Normal range of motion. No JVD present. Carotid bruit is not present.  Cardiovascular: Normal rate, regular rhythm, S1 normal, S2 normal and normal heart sounds.   No murmur heard. Pulmonary/Chest:  Breath sounds normal. She has no wheezes. She has no rhonchi. She has no rales.  Abdominal: Soft. There is no tenderness.  Musculoskeletal: She exhibits no edema.  Neurological: She is alert and oriented to person, place, and time.  Skin: Skin is warm and dry.  Psychiatric: She has a normal mood and affect.    ASSESSMENT:    1. Shortness of breath   2. Palpitations   3. Former smoker   4. Ectopic atrial rhythm    PLAN:    In order of problems listed above:  1. Shortness of breath She had a 20 lb weight gain while off of her Adderall in April.  I suspect her weight gain is likely the culprit for her shortness of breath.  However, she has an ectopic atrial rhythm and has been experiencing palpitations.  I will obtain a plain exercise treadmill test to rule out exercise induced arrhythmias, chronotropic incompetence and assess her blood pressure response to exercise.  Will also get a CXR given her prior hx of smoking.   2. Palpitations These sound like benign PAC/PVCs.  Recent TSH, K+ were normal.  Recent echo with normal ejection fraction.  Will get an event monitor to rule out arrhythmia and exercise test.    3. Former smoker Will get CXR.  She is concerned about lung cancer with her hx of smoking.  4. Ectopic atrial rhythm This is an incidental finding and not likely indicative of significant arrhythmias.   Dispo:  Return as needed w/ Dr. Tenny Craw or [name], PA-C .   Medication Adjustments/Labs and Tests Ordered: Current medicines are reviewed at length with the patient today.  Concerns regarding medicines are outlined above.  Orders/Tests:  Orders Placed This Encounter  Procedures  . DG Chest 2 View  . Exercise Tolerance Test  . Cardiac event monitor  . EKG 12-Lead   Medication changes: No orders of the defined types were placed in this encounter.  Signed, [name], PA-C  09/18/2016 10:40 AM    [facility] [address] [place], [place],  [place]  [address] Phone: [phone]; Fax: [phone]

## 2016-09-18 ENCOUNTER — Telehealth: Payer: Self-pay | Admitting: *Deleted

## 2016-09-18 ENCOUNTER — Encounter: Payer: Self-pay | Admitting: Physician Assistant

## 2016-09-18 ENCOUNTER — Encounter (INDEPENDENT_AMBULATORY_CARE_PROVIDER_SITE_OTHER): Payer: Self-pay

## 2016-09-18 ENCOUNTER — Ambulatory Visit
Admission: RE | Admit: 2016-09-18 | Discharge: 2016-09-18 | Disposition: A | Discharge status: Home / Self Care | Payer: Medicaid Other | Source: Ambulatory Visit | Attending: Physician Assistant | Admitting: Physician Assistant

## 2016-09-18 ENCOUNTER — Other Ambulatory Visit: Payer: Self-pay | Admitting: Interventional Radiology

## 2016-09-18 ENCOUNTER — Ambulatory Visit (INDEPENDENT_AMBULATORY_CARE_PROVIDER_SITE_OTHER): Payer: Medicaid Other | Admitting: Physician Assistant

## 2016-09-18 VITALS — BP 130/62 | HR 68 | Ht 63.0 in | Wt 152.0 lb

## 2016-09-18 DIAGNOSIS — Z87891 Personal history of nicotine dependence: Secondary | ICD-10-CM

## 2016-09-18 DIAGNOSIS — R0602 Shortness of breath: Secondary | ICD-10-CM

## 2016-09-18 DIAGNOSIS — I491 Atrial premature depolarization: Secondary | ICD-10-CM

## 2016-09-18 DIAGNOSIS — R002 Palpitations: Secondary | ICD-10-CM

## 2016-09-18 NOTE — Telephone Encounter (Signed)
-----   Message from Beatrice LecherScott T Weaver, [place] JerseyPA-C sent at 09/18/2016 12:53 PM EDT ----- Please call the patient. The chest X-ray is normal. Please fax a copy of this study result to her PCP:  Freddrick MarchAmin, Yashika, MD  Thanks! Tereso NewcomerScott Weaver, PA-C    09/18/2016 12:53 PM

## 2016-09-18 NOTE — Patient Instructions (Signed)
Medication Instructions:  1.Your physician recommends that you continue on your current medications as directed. Please refer to the Current Medication list given to you today.   Labwork: NONE ORDERED TODAY  Testing/Procedures: 1. Your physician has requested that you have an exercise tolerance test. For further information please visit [email or web address]. Please also follow instruction sheet, as given.  2. Your physician has recommended that you wear an event monitor. Event monitors are medical devices that record the heart's electrical activity. Doctors most often us these monitors to diagnose arrhythmias. Arrhythmias are problems with the speed or rhythm of the heartbeat. The monitor is a small, portable device. You can wear one while you do your normal daily activities. This is usually used to diagnose what is causing palpitations/syncope (passing out).  3. A chest x-ray takes a picture of the organs and structures inside the chest, including the heart, lungs, and blood vessels. This test can show several things, including, whether the heart is enlarges; whether fluid is building up in the lungs; and whether pacemaker / defibrillator leads are still in place. THIS IS TO BE DONE TODAY AT [facility] AT [address]     Follow-Up: AS NEEDED AT THIS TIME WITH DR. ROSS OR SCOTT WEAVER, PAC ; PENDING TEST RESULTS  Any Other Special Instructions Will Be Listed Below (If Applicable).     If you need a refill on your cardiac medications before your next appointment, please call your pharmacy.

## 2016-09-18 NOTE — Telephone Encounter (Signed)
Pt has been notified of CXR results by phone with verbal understanding. Pt thanked me for my call today.  

## 2016-09-24 ENCOUNTER — Encounter: Payer: Self-pay | Admitting: Physician Assistant

## 2016-09-24 ENCOUNTER — Ambulatory Visit (INDEPENDENT_AMBULATORY_CARE_PROVIDER_SITE_OTHER): Payer: Medicaid Other

## 2016-09-24 DIAGNOSIS — R002 Palpitations: Secondary | ICD-10-CM

## 2016-09-24 DIAGNOSIS — R0602 Shortness of breath: Secondary | ICD-10-CM

## 2016-09-24 LAB — EXERCISE TOLERANCE TEST
CHL CUP RESTING HR STRESS: 82 {beats}/min
CSEPED: 7 min
CSEPEDS: 48 s
CSEPEW: 9.7 METS
CSEPHR: 91 %
MPHR: 185 {beats}/min
Peak HR: 169 {beats}/min
RPE: 15

## 2016-09-25 ENCOUNTER — Telehealth: Payer: Self-pay | Admitting: *Deleted

## 2016-09-25 NOTE — Telephone Encounter (Signed)
-----   Message from Beatrice Lecher, [place] sent at 09/24/2016  4:22 PM EDT ----- Please call the patient. The stress test is normal. Her blood pressure was somewhat elevated prior to starting but increased appropriately. Please ask patient if she is able to monitor blood pressure over the next 2-3 weeks.  If so, send a list of readings for review. Please fax a copy of this study result to her PCP:  Freddrick March, MD  Thanks! [name], PA-C    09/24/2016 4:20 PM

## 2016-09-25 NOTE — Telephone Encounter (Signed)
Pt has been notified of her ETT results. Pt aware BP was somewhat elevated before starting stress test but did increase appropriately during test. Recommendation to monitor BP over the next 2-3 weeks and send readings to [name], [place]. Pt is agreeable to plan of care. I will forward a copy of ETT results to PCP as well, pt agreeable. Pt thanked me for my call today.

## 2016-09-27 ENCOUNTER — Encounter: Payer: Self-pay | Admitting: Physician Assistant

## 2016-09-29 ENCOUNTER — Telehealth: Payer: Self-pay | Admitting: Physician Assistant

## 2016-09-29 DIAGNOSIS — R0602 Shortness of breath: Secondary | ICD-10-CM

## 2016-09-29 NOTE — Telephone Encounter (Signed)
Please call patient and arrange a BNP to be drawn.  I will place the order. [name], PA-C    09/29/2016 4:56 PM

## 2016-09-29 NOTE — Telephone Encounter (Signed)
Left message per [name], PA he has ordered lab work for the pt to have done ( Pro BNP ). Lmom to call the office back and schedule lab appt. Order is in Epic.

## 2016-09-30 ENCOUNTER — Other Ambulatory Visit: Payer: Medicaid Other | Admitting: *Deleted

## 2016-09-30 DIAGNOSIS — R0602 Shortness of breath: Secondary | ICD-10-CM

## 2016-09-30 LAB — PRO B NATRIURETIC PEPTIDE: NT-Pro BNP: 61 pg/mL (ref 0–130)

## 2016-09-30 NOTE — Addendum Note (Signed)
Addended by: Tonita Phoenix on: 09/30/2016 09:08 AM   Modules accepted: Orders

## 2016-09-30 NOTE — Progress Notes (Signed)
pbnp

## 2016-10-01 ENCOUNTER — Telehealth: Payer: Self-pay | Admitting: *Deleted

## 2016-10-01 NOTE — Telephone Encounter (Signed)
-----   Message from Beatrice Lecher, [place] sent at 10/01/2016  8:43 AM EDT ----- Please call the patient BNP is normal. Continue with current treatment plan. [name], PA-C   10/01/2016 8:43 AM

## 2016-10-01 NOTE — Telephone Encounter (Signed)
Pt called back and states she is aware of normal BNP results.

## 2016-10-01 NOTE — Telephone Encounter (Signed)
New Message     Pt returned your call , she is aware the BNP is normal if you need her to change anything please call

## 2016-10-01 NOTE — Telephone Encounter (Signed)
Lmtcb to go over lab results 

## 2016-10-01 NOTE — Telephone Encounter (Signed)
-----   Message from Scott T Weaver, PA-C sent at 10/01/2016  8:43 AM EDT ----- Please call the patient BNP is normal. Continue with current treatment plan. Scott Weaver, PA-C   10/01/2016 8:43 AM 

## 2016-10-06 ENCOUNTER — Encounter: Payer: Self-pay | Admitting: Physician Assistant

## 2016-10-30 ENCOUNTER — Telehealth: Payer: Self-pay | Admitting: *Deleted

## 2016-10-30 NOTE — Telephone Encounter (Signed)
-----   Message from Dietrich PatesPaula Ross V, MD sent at 10/29/2016 11:23 AM EDT ----- Pt seen by Wende MottS Weaver in September Monitor shows SR with isolated PACs Symptoms of palpitations did not always correlate with skeps Could try low dose toprol 25 mg per day to see if helps

## 2016-10-30 NOTE — Telephone Encounter (Signed)
Pt has been notified of monitor results. Pt advised we could try a low dose of a beta blocker Toprol XL 25 mg daily. Pt said she really "wants to know why her heart is misfiring out of the SA Node." I explained to the pt these were just some isolated PAC's, which are common and can generally be helped by taking a beta blocker. Pt states to me "will call me back on Monday after she talks with some of her Cardiology doctor friends at her job at [employer]." I said that will be fine.

## 2016-11-02 ENCOUNTER — Telehealth: Payer: Self-pay | Admitting: Physician Assistant

## 2016-11-02 NOTE — Telephone Encounter (Signed)
New message    Pt is calling returning call to [place].

## 2016-11-02 NOTE — Telephone Encounter (Signed)
I returned pt's call from earlier today who states she several questions she would like to ask Tereso NewcomerScott Weaver, PA before making any decisions on starting a new medication. Pt asked maybe she should be put on an ACE Inhibitor and not a Beta Blocker and wants to know what does she have to do to be referred to see EP. I advised pt she can send a message to [facility] for Hollins[facility] with her questions for him. Pt aware once he reviews her questions he will decide the best plan of care for her and we will let her know. Pt thanked me for my call back today.

## 2016-11-16 ENCOUNTER — Encounter: Payer: Self-pay | Admitting: Physician Assistant

## 2016-11-16 ENCOUNTER — Telehealth: Payer: Self-pay | Admitting: Physician Assistant

## 2016-11-16 NOTE — Telephone Encounter (Signed)
New message   Patient is calling to follow up on a conversation she said she had with nurse.  States she was told that she needed to see an EP physician and would be given a referral. Please advise Please call

## 2016-11-16 NOTE — Telephone Encounter (Signed)
Pt sent MY CHART message as well. I replied to pt through [facility].

## 2016-11-24 ENCOUNTER — Ambulatory Visit: Payer: Medicaid Other | Admitting: Family Medicine

## 2016-11-24 ENCOUNTER — Encounter: Payer: Self-pay | Admitting: Family Medicine

## 2016-11-24 VITALS — Temp 98.6°F | Wt 160.4 lb

## 2016-11-24 DIAGNOSIS — L304 Erythema intertrigo: Secondary | ICD-10-CM

## 2016-11-24 MED ORDER — DESONIDE 0.05 % EX CREA: TOPICAL_CREAM | Freq: Two times a day (BID) | CUTANEOUS | 0 refills | Status: DC

## 2016-11-24 NOTE — Patient Instructions (Signed)
You were seen in clinic today for itchy bumps which are most likely due to irritant intertrigo.  I have prescribed a low-dose steroid cream for you to apply to the affected area twice a day which should take care of your symptoms.  Additionally as we discussed, keeping the area dry and clean will help reduce the moisture in the area.  Make sure you are wearing underwear which is looser and comfortable to reduce irritation to the crease as well.  If you have worsening irritation or symptoms despite using the cream, please schedule an appointment to come in and be seen.  Be well, Freddrick MarchYashika Telicia Hodgkiss, MD

## 2016-11-24 NOTE — Progress Notes (Signed)
   Subjective:   Patient ID: Janet Hughes    DOB: 01/24/1980, 36 y.o. female   MRN: [id number]  CC: Intergluteal itching  HPI: Janet Hughes is a 36 y.o. female who presents to clinic today with four days of intergluteal itching and discomfort. She says that she has tried using Desitin and Ketoconazole but has not had relief. She denies bleeding, fever, and oozing. She says that she has not used any new soaps or detergents, and has noticed the area of discomfort to be spreading. She says that she has never had this issue in her intergluteal area. She denies a PMH of herpes simplex or frequent yeast infections.   ROS: See HPI for pertinent ROS.  PMFSH: Pertinent past medical, surgical, family, and social history were reviewed and updated as appropriate.   Medications reviewed. Current Outpatient Medications  Medication Sig Dispense Refill  . ALPRAZolam (XANAX) 0.5 MG tablet Take 1 tablet (0.5 mg total) by mouth at bedtime as needed for sleep. 30 tablet 2  . amphetamine-dextroamphetamine (ADDERALL) 20 MG tablet Take 0.5 tablets (10 mg total) by mouth 2 (two) times daily. 30 tablet 0  . desonide (DESOWEN) 0.05 % cream Apply 2 (two) times daily topically. 30 g 0  . Multiple Vitamins-Minerals (HAIR VITAMINS PO) Take 3,000 mcg by mouth 3 (three) times daily.    . norgestimate-ethinyl estradiol (ORTHO-CYCLEN,SPRINTEC,PREVIFEM) 0.25-35 MG-MCG tablet Take 1 tablet by mouth daily. 1 Package 11   No current facility-administered medications for this visit.     Objective:   Temp 98.6 F (37 C) (Oral)   Wt 160 lb 6.4 oz (72.8 kg)   LMP 11/02/2016 (Exact Date)   SpO2 100%   BMI 28.41 kg/m  Vitals and nursing note reviewed.  General: well nourished, well developed, in no acute distress with non-toxic appearance HEENT: normocephalic, atraumatic, moist mucous membranes CV: regular rate and rhythm without murmurs rubs or gallops Lungs: non labored breathing.  Skin: Slightly erythematous  2 inch area in intergluteal cleft with 3 non-inflammed small papules in cleft. No purulence or warmth.  Extremities: warm and well perfused, normal tone  Assessment & Plan:   [name] is a 36 year old female presenting for intergluteal pruritis and discomfort likely intergluteal intertrigo. Physical exam non consistent with folliculitis or yeast infection.  - Prescribed Desonide 0.05% cream BID with return precautions if no improvement in symptoms.  Meds ordered this encounter  Medications  . desonide (DESOWEN) 0.05 % cream    Sig: Apply 2 (two) times daily topically.    Dispense:  30 g    Refill:  0    Jerrilyn CairoKelly Blossie Raffel, MS3 11/24/2016 2:59 PM

## 2016-11-24 NOTE — Progress Notes (Signed)
   Subjective:   Patient ID: Synetta FailElizabeth B Sabino    DOB: 01/12/1980, 36 y.o. female   MRN: [id number]  CC: Intergluteal itching  HPI: Synetta Faillizabeth B Sansoucie is a 36 y.o. female who presents to clinic today with four days of intergluteal itching and discomfort. She says that she has tried using Desitin and Ketoconazole but has not had relief. She denies bleeding, fever, and oozing. She says that she has not used any new soaps or detergents, and has noticed the area of discomfort to be spreading. She says that she has never had this issue in her intergluteal area. She denies a PMH of herpes simplex or frequent yeast infections.   ROS: See HPI for pertinent ROS.  PMFSH: Pertinent past medical, surgical, family, and social history were reviewed and updated as appropriate.   Medications reviewed. Objective:   Temp 98.6 F (37 C) (Oral)   Wt 160 lb 6.4 oz (72.8 kg)   LMP 11/02/2016 (Exact Date)   SpO2 100%   BMI 28.41 kg/m  Vitals and nursing note reviewed.  General: well nourished, well developed, in no acute distress with non-toxic appearance HEENT: normocephalic, atraumatic, moist mucous membranes CV: regular rate and rhythm without murmurs rubs or gallops Lungs: non labored breathing.  Skin: Slightly erythematous 2 inch area in intergluteal cleft with 3 non-inflammed small papules in cleft. No purulence or warmth.  Extremities: warm and well perfused, normal tone  Assessment & Plan:   [name] is a 36 year old female presenting for intergluteal pruritis and discomfort likely intergluteal intertrigo secondary to irritation. Physical exam non consistent with folliculitis or yeast infection.  - Prescribed Desonide 0.05% cream BID with return precautions if no improvement in symptoms.       Meds ordered this encounter  Medications  . desonide (DESOWEN) 0.05 % cream    Sig: Apply 2 (two) times daily topically.    Dispense:  30 g    Refill:  0   Freddrick MarchYashika Akeiba Axelson, MD [facility], PGY-2

## 2016-12-15 ENCOUNTER — Ambulatory Visit: Payer: Medicaid Other | Admitting: Cardiology

## 2016-12-15 ENCOUNTER — Encounter: Payer: Self-pay | Admitting: Cardiology

## 2016-12-15 VITALS — BP 136/88 | HR 62 | Resp 16 | Ht 63.0 in | Wt 157.0 lb

## 2016-12-15 DIAGNOSIS — I10 Essential (primary) hypertension: Secondary | ICD-10-CM | POA: Diagnosis not present

## 2016-12-15 DIAGNOSIS — I491 Atrial premature depolarization: Secondary | ICD-10-CM

## 2016-12-15 MED ORDER — METOPROLOL SUCCINATE ER 25 MG PO TB24: 12.5000 mg | ORAL_TABLET | Freq: Every day | ORAL | 3 refills | Status: DC

## 2016-12-15 NOTE — Patient Instructions (Addendum)
Medication Instructions:   START TAKING TOPROL XL HALF OF 25 MG (12.5 MG ) ONCE A DAY   If you need a refill on your cardiac medications before your next appointment, please call your pharmacy.  Labwork:  NONE ORDERED  TODAY    Testing/Procedures: NONE ORDERED  TODAY    Follow-Up: IN ONE  MONTH  WITH ROSS OR AVAILABLE APP   Any Other Special Instructions Will Be Listed Below (If Applicable).

## 2016-12-15 NOTE — Progress Notes (Signed)
Cardiology Office Note   Date:  12/15/2016   ID:  Janet Hughes, DOB 01/27/1980, MRN [id number]  PCP:  Janet Hughes, Yashika, MD  Cardiologist:  Dr. Tenny Crawoss    Chief Complaint  Patient presents with  . Medication Management    Hypertension      History of Present Illness: Janet Hughes is a 36 y.o. female who presents for HTN and PACs.  Janet Hughes was going to place on toprol but she was hesitant so she came in today to review.  Currently she is in school and has 2 daughter that are involved with school.   She also works 3 12 hour shifts per week.  She may also become pregnant after she completes school.      She was last seen as new pt for Dr. Tenny Crawoss 09/18/16   former smoker with a hx of ADD who is being seen today for the evaluation of palpitations at the request of Janet Hughes, Yashika, MD.   Janet Hughes has noted palpitations for the last 4 mos.  She notes a skipping sensation.  This can occur at any time.  It is not necessarily assoc with activity. She notes dyspnea on exertion as well.  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  She denies chest pain, paroxysmal nocturnal dyspnea, edema, syncope.   ETT was done and was normal. CXR--normal Event monitor SR with isolated PACs.  At times she thought she had skipped beats she did not.     Today she has no chest pain and SOB with exercise.  Only has 1 cup of coffee per week.  Her BP has been 130/90's  When she ate [place] her BP went up to 154/96.  We discussed stopping salt or high doses.  Discussed no pseudoephedrine.    She does have + FH of HTN.  She has PACs and with the skips it feels her heart would stop - she was reassured it would not.  It is a nuisance though.  The BB will control BP and help reduce PACs.       Past Medical History:  Diagnosis Date  . Abortion, therapeutic 12/01  . ADHD (attention deficit hyperactivity disorder) 08/07/2014  .  Anxiety   . Breast lump in female 06/27/2015  . Chlamydia infection    repeated infections  . 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 Dysuria 12/20/2009   Qualifier: Diagnosis of  By: Humberto SealsSaxon NP, Darl PikesSusan    . Ectopic pregnancy 09/08/2014  . HEMORRHOIDS 11/15/2007   Qualifier: Diagnosis of  By: Humberto SealsSaxon NP, Darl PikesSusan    . History of stress test    GXT 9/18: normal   . Insomnia 01/03/2013  . Oral contraceptive prescribed 02/09/2014  . Palpitations 09/15/2016  . Post partum depression   . Rash and nonspecific skin eruption 07/22/2015  . Reactive cervical lymphadenopathy 01/20/2011  . SORE THROAT 02/10/2010   Qualifier: Diagnosis of  By: Humberto SealsSaxon NP, Darl PikesSusan    . Tobacco abuse 04/28/2011  . UNSPECIFIED VAGINITIS AND VULVOVAGINITIS 12/20/2009   Qualifier: Diagnosis of  By: Humberto SealsSaxon NP, Darl PikesSusan    . Vaginal discharge 11/11/2014  . Vaginitis 07/27/2011  . Yeast infection 07/19/2012    Past Surgical History:  Procedure Laterality Date  . BREAST ENHANCEMENT SURGERY Bilateral 04/26/2012  . CESAREAN SECTION  2006  . COLPOSCOPY     no bx, pregnant  . LAPAROTOMY N/A 09/08/2014   Procedure: EXPLORATORY LAPAROTOMY WITH RIGHT SALPINGECTOMY;  Surgeon: Tilda Burrow, MD;  Location: [facility];  Service: Gynecology;  Laterality: N/A;  Ruptured Right Ectopic Pregnancy     Current Outpatient Medications  Medication Sig Dispense Refill  . ALPRAZolam (XANAX) 0.5 MG tablet Take 1 tablet (0.5 mg total) by mouth at bedtime as needed for sleep. 30 tablet 2  . amphetamine-dextroamphetamine (ADDERALL) 20 MG tablet Take 0.5 tablets (10 mg total) by mouth 2 (two) times daily. 30 tablet 0  . MELATONIN GUMMIES PO Take 5 mg by mouth 2 (two) times daily.    . Multiple Vitamins-Minerals (HAIR VITAMINS PO) Take 3,000 mcg by mouth 3 (three) times daily.    . norgestimate-ethinyl estradiol (ORTHO-CYCLEN,SPRINTEC,PREVIFEM) 0.25-35 MG-MCG tablet Take 1 tablet by mouth daily. 1 Package 11    No current facility-administered medications for this visit.     Allergies:   Patient has no known allergies.    Social History:  The patient  reports that she quit smoking about 4 years ago. Her smoking use included cigarettes and e-cigarettes. She smoked 0.30 packs per day. she has never used smokeless tobacco. She reports that she does not drink alcohol or use drugs.   Family History:  The patient's family history includes Anxiety disorder in her father; COPD in her maternal grandfather; Cancer in her maternal aunt; Depression in her mother; Heart failure in her maternal grandmother; Hypertension in her father and maternal grandmother; Post-traumatic stress disorder in her father.    ROS:  General:no colds or fevers, no weight changes to mild loss Skin:no rashes or ulcers HEENT:no blurred vision, no congestion CV:see HPI PUL:see HPI GI:no diarrhea constipation or melena, no indigestion GU:no hematuria, no dysuria MS:no joint pain, no claudication Neuro:no syncope, no lightheadedness Endo:no diabetes, no thyroid disease  TSH has been evaluated  Wt Readings from Last 3 Encounters:  12/15/16 157 lb (71.2 kg)  11/24/16 160 lb 6.4 oz (72.8 kg)  09/18/16 152 lb (68.9 kg)     PHYSICAL EXAM: VS:  BP 136/88   Pulse 62   Resp 16   Ht 5\' 3"  (1.6 m)   Wt 157 lb (71.2 kg)   SpO2 98%   BMI 27.81 kg/m  , BMI Body mass index is 27.81 kg/m. General:Pleasant affect, NAD Skin:Warm and dry, brisk capillary refill HEENT:normocephalic, sclera clear, mucus membranes moist Neck:supple, no JVD, no bruits  Heart:S1S2 RRR without murmur, gallup, rub or click Lungs:clear without rales, rhonchi, or wheezes ZOX:WRUE, non tender, + BS, do not palpate liver spleen or masses Ext:no lower ext edema, 2+ pedal pulses, 2+ radial pulses Neuro:alert and oriented, MAE, follows commands, + facial symmetry    EKG:  EKG is ordered today. The ekg ordered today demonstrates possible ectopic atrial  rhythm no PACs and no ST changes.   Recent Labs: 09/11/2016: BUN 14; Creatinine, Ser 0.87; Hemoglobin 12.5; Platelets 272; Potassium 4.2; Sodium 137; TSH 0.939 09/30/2016: NT-Pro BNP 61    Lipid Panel    Component Value Date/Time   CHOL 181 06/27/2015 1637   TRIG 38 06/27/2015 1637   HDL 84 06/27/2015 1637   CHOLHDL 2.2 06/27/2015 1637   VLDL 8 06/27/2015 1637   LDLCALC 89 06/27/2015 1637       Other studies Reviewed: Additional studies/ records that were reviewed today include: . Echo 09/15/16 Study Conclusions  - Left ventricle: The cavity size was normal.  Systolic function was   normal. The estimated ejection fraction was in the range of 55%   to 60%. Wall motion was normal; there were no regional wall   motion abnormalities. Left ventricular diastolic function   parameters were normal. - Aortic valve: Transvalvular velocity was within the normal range.   There was no stenosis. There was no regurgitation. - Mitral valve: Transvalvular velocity was within the normal range.   There was no evidence for stenosis. There was trivial   regurgitation. - Left atrium: The atrium was mildly dilated. - Right ventricle: The cavity size was normal. Wall thickness was   normal. Systolic function was normal. - Tricuspid valve: There was trivial regurgitation. - Pulmonary arteries: Systolic pressure was within the normal   range. PA peak pressure: 26 mm Hg (S).   ETT Study Highlights    Blood pressure demonstrated a normal response to exercise.  Pt wallked for 7:48 on A Bruce GXT . Peak HR if 169 which is 91 % predicted HR. There were no ST or T wave changes to suggest ischemia.  BP response was normal .  Negative GXT     Out pt monitor  Monitor shows SR with isolated PACs Symptoms of palpitations did not always correlate with skeps Could try low dose toprol 25 mg per day to see if helps         ASSESSMENT AND PLAN:  1.  HTN will add metoprolol XL will start at  12.5 mg daily, if BP remains elevated would increase to 25 mg  She will call her BP in about 2 weeks. Some of this may be due to her 30 lb wt gain, she is working on this.    2.  PACs again the BB will help this.   She will follow up with Dr. Tenny Crawoss or Janet Hughes in 4 weeks or so.    3.  Possible ectopic atrial rhythm.    Current medicines are reviewed with the patient today.  The patient Has no concerns regarding medicines.  The following changes have been made:  See above Labs/ tests ordered today include:see above  Disposition:   FU:  see above  Signed, Nada BoozerLaura Ingold, NP  12/15/2016 4:04 PM    [facility] [address], [place], [place]  [address]/ [address] [place], [place] Phone: [phone]; Fax: [phone]  [phone]

## 2016-12-25 ENCOUNTER — Encounter: Payer: Self-pay | Admitting: Cardiology

## 2016-12-29 MED ORDER — METOPROLOL SUCCINATE ER 25 MG PO TB24: 12.5000 mg | ORAL_TABLET | Freq: Two times a day (BID) | ORAL | 3 refills | Status: DC

## 2017-01-01 ENCOUNTER — Ambulatory Visit: Payer: Medicaid Other | Admitting: Family Medicine

## 2017-01-01 ENCOUNTER — Other Ambulatory Visit: Payer: Self-pay

## 2017-01-01 ENCOUNTER — Encounter: Payer: Self-pay | Admitting: Family Medicine

## 2017-01-01 DIAGNOSIS — F900 Attention-deficit hyperactivity disorder, predominantly inattentive type: Secondary | ICD-10-CM

## 2017-01-01 MED ORDER — LISDEXAMFETAMINE DIMESYLATE 30 MG PO CAPS: 30.0000 mg | ORAL_CAPSULE | Freq: Every day | ORAL | 0 refills | Status: DC

## 2017-01-01 NOTE — Progress Notes (Signed)
   Subjective:   Patient ID: Janet Hughes    DOB: 08/27/1980, 36 y.o. female   MRN: [id number]  CC: ADHD   HPI: Janet Hughes is a 36 y.o. female who presents to clinic today for medication change.  ADHD Follows with Cardiology and at her visit was recommended by specialist to change medication to Vyvanse if appropriate as Aderrall may be contributing to PACs.  She takes 20 mg daily and reports good medication compliance.  Also started on Metoprolol 12.5 mg BID by Cardiology.  She has not noticed heart racing or other palpitations, chest pain, or shortness of breath. She denies difficulty with sleep or appetite changes.   ROS: Denies fevers, chills, nausea, vomiting, abdominal pain, shortness of breath.    PMFSH: Pertinent past medical, surgical, family, and social history were reviewed and updated as appropriate. Smoking status reviewed. Medications reviewed.  Objective:   BP 128/74   Pulse 72   Temp 98.7 F (37.1 C) (Oral)   Wt 156 lb 3.2 oz (70.9 kg)   SpO2 99%   BMI 27.67 kg/m  Vitals and nursing note reviewed.  General: pleasant 36 yo female, NAD  CV: RRR no MRG  Lungs: CTAB, non-laboured  Abdomen: soft, NTND, +bs  Skin: warm, dry, no rash Extremities: warm and well perfused, normal tone  Assessment & Plan:   ADHD (attention deficit hyperactivity disorder) Will change Aderrall to Vyvanse per Cardiology recommendations.  -Discontinue Aderral and start 30 mg Vyvanse daily -Will monitor for 2 weeks and patient to report how she is doing; at that time will refill and increase dose by 10 mg  -Return precautions discussed   Meds ordered this encounter  Medications  . lisdexamfetamine (VYVANSE) 30 MG capsule    Sig: Take 1 capsule (30 mg total) by mouth daily.    Dispense:  15 capsule    Refill:  0   Follow up: PRN   Freddrick MarchYashika Rilla Buckman, MD [facility], PGY-2 01/04/2017 4:08 PM

## 2017-01-01 NOTE — Patient Instructions (Signed)
We have changed your medication to Vyvanse from Aderrall.  I would like for you to take 30 mg daily for the next 2 weeks and let me know how you are doing.   At that time we will increase your dose or keep it the same depending on how you feel.    Enjoy the holidays!  Freddrick MarchYashika Satya Bohall, MD

## 2017-01-04 NOTE — Assessment & Plan Note (Addendum)
Will change Aderrall to Vyvanse per Cardiology recommendations.  -Discontinue Aderral and start 30 mg Vyvanse daily -Will monitor for 2 weeks and patient to report how she is doing; at that time will refill and increase dose by 10 mg  -Return precautions discussed

## 2017-01-25 ENCOUNTER — Encounter: Payer: Self-pay | Admitting: Cardiology

## 2017-01-25 ENCOUNTER — Ambulatory Visit (INDEPENDENT_AMBULATORY_CARE_PROVIDER_SITE_OTHER): Payer: Self-pay | Admitting: Cardiology

## 2017-01-25 VITALS — BP 128/82 | HR 69 | Ht 65.0 in | Wt 155.5 lb

## 2017-01-25 DIAGNOSIS — R002 Palpitations: Secondary | ICD-10-CM

## 2017-01-25 DIAGNOSIS — I10 Essential (primary) hypertension: Secondary | ICD-10-CM

## 2017-01-25 DIAGNOSIS — I491 Atrial premature depolarization: Secondary | ICD-10-CM

## 2017-01-25 NOTE — Progress Notes (Signed)
Cardiology Office Note   Date:  01/25/2017   ID:  Janet Hughes, DOB 06/23/1980, MRN [id number]  PCP:  Janet March, MD  Cardiologist:  Dr. Tenny Craw    Chief Complaint  Patient presents with  . Palpitations      History of Present Illness: Janet Hughes is a 37 y.o. female who presents for HTN and PACs.    Janet Hughes was going to place on toprol but she was hesitant so she came in today to review.  Currently she is in school and has 2 daughter that are involved with school.   She also works 3 12 hour shifts per week.  She may also become pregnant after she completes school.      She was last seen as new pt for Dr. Tenny Craw 09/18/16  former smokerwith a hx ofADDwho is being seen today for the evaluation of palpitationsat the request of Janet March, MD.  Ms.Chavishas notedpalpitationsfor the last 4 mos. She notes a skipping sensation. This can occur at any time. It is not necessarily assoc with activity. She notes dyspnea on exertionas well. 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   ETT was done and was normal. CXR--normal Event monitor SR with isolated PACs.  At times she thought she had skipped beats she did not.     Today she is back after starting BB.  Palpitations are much improved. She may be aware of a few after Vyvanse in AM but then resolves.  No chest pain or SOB.  No colds so far this winter.  Her BP checks with elevated diastolic but improved on toprol.      Past Medical History:  Diagnosis Date  . Abortion, therapeutic 12/01  . ADHD (attention deficit hyperactivity disorder) 08/07/2014  . Anxiety   . Breast lump in female 06/27/2015  . Chlamydia infection    repeated infections  . 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 Dysuria 12/20/2009   Qualifier: Diagnosis of  By: Humberto Seals NP, Darl Pikes    . Ectopic  pregnancy 09/08/2014  . HEMORRHOIDS 11/15/2007   Qualifier: Diagnosis of  By: Humberto Seals NP, Darl Pikes    . History of stress test    GXT 9/18: normal   . Insomnia 01/03/2013  . Oral contraceptive prescribed 02/09/2014  . Palpitations 09/15/2016  . Post partum depression   . Rash and nonspecific skin eruption 07/22/2015  . Reactive cervical lymphadenopathy 01/20/2011  . SORE THROAT 02/10/2010   Qualifier: Diagnosis of  By: Humberto Seals NP, Darl Pikes    . Tobacco abuse 04/28/2011  . UNSPECIFIED VAGINITIS AND VULVOVAGINITIS 12/20/2009   Qualifier: Diagnosis of  By: Humberto Seals NP, Darl Pikes    . Vaginal discharge 11/11/2014  . Vaginitis 07/27/2011  . Yeast infection 07/19/2012    Past Surgical History:  Procedure Laterality Date  . BREAST ENHANCEMENT SURGERY Bilateral 04/26/2012  . CESAREAN SECTION     2006  . COLPOSCOPY     no bx, pregnant  . LAPAROTOMY N/A 09/08/2014   Procedure: EXPLORATORY LAPAROTOMY WITH RIGHT SALPINGECTOMY;  Surgeon: Janet Burrow, MD;  Location: [facility];  Service: Gynecology;  Laterality: N/A;  Ruptured Right Ectopic Pregnancy     Current Outpatient Medications  Medication Sig Dispense Refill  . ALPRAZolam (XANAX) 0.5 MG tablet Take 1 tablet (0.5 mg total) by mouth at  bedtime as needed for sleep. 30 tablet 2  . lisdexamfetamine (VYVANSE) 30 MG capsule Take 1 capsule (30 mg total) by mouth daily. 15 capsule 0  . MELATONIN GUMMIES PO Take 5 mg by mouth 2 (two) times daily.    . metoprolol succinate (TOPROL XL) 25 MG 24 hr tablet Take 0.5 tablets (12.5 mg total) by mouth 2 (two) times daily. 30 tablet 3  . Multiple Vitamins-Minerals (HAIR VITAMINS PO) Take 3,000 mcg by mouth 3 (three) times daily.    . norgestimate-ethinyl estradiol (ORTHO-CYCLEN,SPRINTEC,PREVIFEM) 0.25-35 MG-MCG tablet Take 1 tablet by mouth daily. 1 Package 11   No current facility-administered medications for this visit.     Allergies:   Patient has no known allergies.    Social History:  The patient  reports that she quit  smoking about 4 years ago. Her smoking use included cigarettes and e-cigarettes. She smoked 0.30 packs per day. she has never used smokeless tobacco. She reports that she does not drink alcohol or use drugs.   Family History:  The patient's family history includes Anxiety disorder in her father; COPD in her maternal grandfather; Cancer in her maternal aunt; Depression in her mother; Heart failure in her maternal grandmother; Hypertension in her father and maternal grandmother; Post-traumatic stress disorder in her father.    ROS:  General:no colds or fevers, + weight loss Skin:no rashes or ulcers HEENT:no blurred vision, no congestion CV:see HPI PUL:see HPI GI:no diarrhea constipation or melena, no indigestion GU:no hematuria, no dysuria MS:no joint pain, no claudication Neuro:no syncope, no lightheadedness Endo:no diabetes, no thyroid disease  Wt Readings from Last 3 Encounters:  01/25/17 155 lb 8 oz (70.5 kg)  01/01/17 156 lb 3.2 oz (70.9 kg)  12/15/16 157 lb (71.2 kg)     PHYSICAL EXAM: VS:  BP 128/82   Pulse 69   Ht 5\' 5"  (1.651 m)   Wt 155 lb 8 oz (70.5 kg)   SpO2 99%   BMI 25.88 kg/m  , BMI Body mass index is 25.88 kg/m. General:Pleasant affect, NAD Skin:Warm and dry, brisk capillary refill HEENT:normocephalic, sclera clear, mucus membranes moist Neck:supple, no JVD, no bruits  Heart:S1S2 RRR without murmur, gallup, rub or click Lungs:clear without rales, rhonchi, or wheezes ZOX:WRUEAbd:soft, non tender, + BS, do not palpate liver spleen or masses Ext:no lower ext edema, 2+ pedal pulses, 2+ radial pulses Neuro:alert and oriented X 3, MAE, follows commands, + facial symmetry    EKG:  EKG is NOT  ordered today.    Recent Labs: 09/11/2016: BUN 14; Creatinine, Ser 0.87; Hemoglobin 12.5; Platelets 272; Potassium 4.2; Sodium 137; TSH 0.939 09/30/2016: NT-Pro BNP 61    Lipid Panel    Component Value Date/Time   CHOL 181 06/27/2015 1637   TRIG 38 06/27/2015 1637   HDL 84  06/27/2015 1637   CHOLHDL 2.2 06/27/2015 1637   VLDL 8 06/27/2015 1637   LDLCALC 89 06/27/2015 1637       Other studies Reviewed: Additional studies/ records that were reviewed today include:  Echo 09/15/16 Study Conclusions  - Left ventricle: The cavity size was normal. Systolic function was normal. The estimated ejection fraction was in the range of 55% to 60%. Wall motion was normal; there were no regional wall motion abnormalities. Left ventricular diastolic function parameters were normal. - Aortic valve: Transvalvular velocity was within the normal range. There was no stenosis. There was no regurgitation. - Mitral valve: Transvalvular velocity was within the normal range. There was no evidence for stenosis.  There was trivial regurgitation. - Left atrium: The atrium was mildly dilated. - Right ventricle: The cavity size was normal. Wall thickness was normal. Systolic function was normal. - Tricuspid valve: There was trivial regurgitation. - Pulmonary arteries: Systolic pressure was within the normal range. PA peak pressure: 26 mm Hg (S).   ETT Study Highlights    Blood pressure demonstrated a normal response to exercise.  Pt wallked for 7:48 on A Bruce GXT . Peak HR if 169 which is 91 % predicted HR. There were no ST or T wave changes to suggest ischemia.  BP response was normal .  Negative GXT    Out pt monitor  Monitor shows SR with isolated PACs Symptoms of palpitations did not always correlate with skeps Could try low dose toprol 25 mg per day to see if helps         ASSESSMENT AND PLAN:  1.  HTN improved tough diastolic is still mildly elevated.  With more time should decrease on metoprolol succinate  2.   Plalpations much improved- follow up with Dr. Tenny Craw in 4-5 months noted PACs on holter       Current medicines are reviewed with the patient today.  The patient Has no concerns regarding medicines.  The following  changes have been made:  See above Labs/ tests ordered today include:see above  Disposition:   FU:  see above  Signed, Nada Boozer, NP  01/25/2017 10:02 AM    [facility] [address] [place], [place], [place]  [address]/ [address] [place], [place] Phone: [phone]; Fax: [phone]  [phone]

## 2017-01-25 NOTE — Patient Instructions (Signed)
Medication Instructions:  Your physician recommends that you continue on your current medications as directed. Please refer to the Current Medication list given to you today.   Labwork: None ordered today  Testing/Procedures: None ordered today  Follow-Up: Your physician recommends that you schedule a follow-up appointment in 4 months with Dr. Tenny Crawoss   Any Other Special Instructions Will Be Listed Below (If Applicable).     If you need a refill on your cardiac medications before your next appointment, please call your pharmacy.

## 2017-02-01 ENCOUNTER — Other Ambulatory Visit: Payer: Self-pay | Admitting: *Deleted

## 2017-02-01 NOTE — Telephone Encounter (Signed)
Patient left message on nurse line requesting refill on Vyvanse at new dosage. Please call pt when rx ready. Kinnie FeilL. Zerick Prevette, RN, BSN

## 2017-02-02 ENCOUNTER — Other Ambulatory Visit: Payer: Self-pay | Admitting: Family Medicine

## 2017-02-02 MED ORDER — LISDEXAMFETAMINE DIMESYLATE 40 MG PO CAPS: 40.0000 mg | ORAL_CAPSULE | Freq: Every day | ORAL | 0 refills | Status: DC

## 2017-02-02 NOTE — Telephone Encounter (Signed)
Please inform patient I have left the Rx at front office.  The new dosage is 40 mg to be taken for 1 month until follow up.

## 2017-02-02 NOTE — Telephone Encounter (Signed)
Called patient and informed her that her RX is up fornt for pick up and gave her new dosage per Dr. Shanda BumpsAmin's instruction.Glennie HawkSimpson, Michelle R, CMA

## 2017-02-03 ENCOUNTER — Telehealth: Payer: Self-pay | Admitting: Family Medicine

## 2017-02-03 ENCOUNTER — Encounter: Payer: Self-pay | Admitting: Family Medicine

## 2017-02-03 ENCOUNTER — Other Ambulatory Visit: Payer: Self-pay | Admitting: Family Medicine

## 2017-02-03 NOTE — Telephone Encounter (Signed)
Pt would like a note left up front stating that she is on Vyvanse. Her work does frequent drug testing and they need a note verifying she is on this medication. Please let her know when it is ready to pick up.

## 2017-02-03 NOTE — Telephone Encounter (Signed)
Please call patient to let her know I have left a letter at the front desk for her to pick up, thanks

## 2017-02-03 NOTE — Telephone Encounter (Signed)
Called patient and informed her that letter has been left up front by Dr. Nelson ChimesAmin.Glennie HawkSimpson, Michelle R, CMA

## 2017-02-24 ENCOUNTER — Telehealth: Payer: Self-pay

## 2017-02-24 NOTE — Telephone Encounter (Signed)
Patient left message on nurse line that Medicaid is not paying for her Vyvanse at 40 mg and wants to know if she can be changed back to 30 mg because there was not a problem with that dose being covered. Ples SpecterAlisa Jaci Desanto, RN [facility] RN)

## 2017-02-26 ENCOUNTER — Other Ambulatory Visit: Payer: Self-pay | Admitting: Family Medicine

## 2017-02-26 MED ORDER — LISDEXAMFETAMINE DIMESYLATE 30 MG PO CAPS: 30.0000 mg | ORAL_CAPSULE | Freq: Every day | ORAL | 0 refills | Status: DC

## 2017-02-26 NOTE — Telephone Encounter (Signed)
Called patient and informed her that her new RX can be picked up at the front desk.Glennie HawkSimpson, Michelle R, CMA

## 2017-02-26 NOTE — Telephone Encounter (Signed)
I am leaving this new Rx at front desk for her.  Please inform pt she may pick it up.

## 2017-02-26 NOTE — Progress Notes (Signed)
Opened in error

## 2017-04-14 ENCOUNTER — Other Ambulatory Visit: Payer: Self-pay

## 2017-04-14 MED ORDER — LISDEXAMFETAMINE DIMESYLATE 30 MG PO CAPS: 30.0000 mg | ORAL_CAPSULE | Freq: Every day | ORAL | 0 refills | Status: DC

## 2017-05-14 ENCOUNTER — Other Ambulatory Visit: Payer: Self-pay | Admitting: Family Medicine

## 2017-05-14 DIAGNOSIS — Z3041 Encounter for surveillance of contraceptive pills: Secondary | ICD-10-CM

## 2017-05-17 ENCOUNTER — Other Ambulatory Visit: Payer: Self-pay | Admitting: Family Medicine

## 2017-05-17 DIAGNOSIS — Z3041 Encounter for surveillance of contraceptive pills: Secondary | ICD-10-CM

## 2017-05-17 MED ORDER — NORGESTIMATE-ETH ESTRADIOL 0.25-35 MG-MCG PO TABS: 1.0000 | ORAL_TABLET | Freq: Every day | ORAL | 0 refills | Status: DC

## 2017-05-17 MED ORDER — NORGESTIMATE-ETH ESTRADIOL 0.25-35 MG-MCG PO TABS: 1.0000 | ORAL_TABLET | Freq: Every day | ORAL | 6 refills | Status: DC

## 2017-05-21 ENCOUNTER — Ambulatory Visit (INDEPENDENT_AMBULATORY_CARE_PROVIDER_SITE_OTHER): Payer: Medicaid Other | Admitting: Internal Medicine

## 2017-05-21 ENCOUNTER — Encounter: Payer: Self-pay | Admitting: Internal Medicine

## 2017-05-21 VITALS — BP 122/78 | HR 68 | Ht 65.0 in | Wt 156.0 lb

## 2017-05-21 DIAGNOSIS — R002 Palpitations: Secondary | ICD-10-CM

## 2017-05-21 DIAGNOSIS — I10 Essential (primary) hypertension: Secondary | ICD-10-CM

## 2017-05-21 MED ORDER — METOPROLOL SUCCINATE ER 25 MG PO TB24: 25.0000 mg | ORAL_TABLET | Freq: Two times a day (BID) | ORAL | 3 refills | Status: DC

## 2017-05-21 MED ORDER — METOPROLOL SUCCINATE ER 25 MG PO TB24: 12.5000 mg | ORAL_TABLET | Freq: Two times a day (BID) | ORAL | 3 refills | Status: DC

## 2017-05-21 MED ORDER — NIFEDIPINE ER OSMOTIC RELEASE 30 MG PO TB24: 30.0000 mg | ORAL_TABLET | Freq: Every day | ORAL | 3 refills | Status: DC

## 2017-05-21 NOTE — Patient Instructions (Addendum)
Your physician has recommended you make the following change in your medication:  1.) Start nifedipine (Procardia XL) 30 mg -- 1 tablet by mouth once a day  Your physician recommends that you schedule a follow-up appointment in: October, 2019 with Dr. Tenny Craw.

## 2017-05-21 NOTE — Progress Notes (Signed)
Cardiology Office Note   Date:  05/21/2017   ID:  Janet Hughes, DOB [birth year], MRN [id number]  PCP:  Freddrick March, MD  Cardiologist:  Dr. Tenny Craw    F/U for HTN      History of Present Illness: Janet Hughes is a 37 y.o. female who presents for HTN and PACs. (holter monitor) I saw her in clinic in Sept 2018   She was seen by Sherian Rein in Jan The pt says her BP at home and work are higher   Breathing is OK    Still has skipds   Isolated     Past Medical History:  Diagnosis Date  . Abortion, therapeutic 12/01  . ADHD (attention deficit hyperactivity disorder) 08/07/2014  . Anxiety   . Breast lump in female 06/27/2015  . Chlamydia infection    repeated infections  . 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 Dysuria 12/20/2009   Qualifier: Diagnosis of  By: Humberto Seals NP, Darl Pikes    . Ectopic pregnancy 09/08/2014  . HEMORRHOIDS 11/15/2007   Qualifier: Diagnosis of  By: Humberto Seals NP, Darl Pikes    . History of stress test    GXT 9/18: normal   . Insomnia 01/03/2013  . Oral contraceptive prescribed 02/09/2014  . Palpitations 09/15/2016  . Post partum depression   . Rash and nonspecific skin eruption 07/22/2015  . Reactive cervical lymphadenopathy 01/20/2011  . SORE THROAT 02/10/2010   Qualifier: Diagnosis of  By: Humberto Seals NP, Darl Pikes    . Tobacco abuse 04/28/2011  . UNSPECIFIED VAGINITIS AND VULVOVAGINITIS 12/20/2009   Qualifier: Diagnosis of  By: Humberto Seals NP, Darl Pikes    . Vaginal discharge 11/11/2014  . Vaginitis 07/27/2011  . Yeast infection 07/19/2012    Past Surgical History:  Procedure Laterality Date  . BREAST ENHANCEMENT SURGERY Bilateral 04/26/2012  . CESAREAN SECTION     2006  . COLPOSCOPY     no bx, pregnant  . LAPAROTOMY N/A 09/08/2014   Procedure: EXPLORATORY LAPAROTOMY WITH RIGHT SALPINGECTOMY;  Surgeon: Tilda Burrow, MD;  Location: [facility];  Service: Gynecology;  Laterality: N/A;  Ruptured Right Ectopic Pregnancy      Current Outpatient Medications  Medication Sig Dispense Refill  . ALPRAZolam (XANAX) 0.5 MG tablet Take 1 tablet (0.5 mg total) by mouth at bedtime as needed for sleep. 30 tablet 2  . MELATONIN GUMMIES PO Take 5 mg by mouth 2 (two) times daily.    . metoprolol succinate (TOPROL XL) 25 MG 24 hr tablet Take 0.5 tablets (12.5 mg total) by mouth 2 (two) times daily. 30 tablet 3  . Multiple Vitamins-Minerals (HAIR VITAMINS PO) Take 3,000 mcg by mouth 3 (three) times daily.    . norgestimate-ethinyl estradiol (ESTARYLLA) 0.25-35 MG-MCG tablet Take 1 tablet by mouth daily. 28 tablet 6  . lisdexamfetamine (VYVANSE) 30 MG capsule Take 1 capsule (30 mg total) by mouth daily. 30 capsule 0   No current facility-administered medications for this visit.     Allergies:   Patient has no known allergies.    Social History:  The patient  reports that she quit smoking about 5 years ago. Her smoking use included cigarettes and e-cigarettes. She smoked 0.30 packs per day. She has never used smokeless tobacco. She reports that she does not drink alcohol or use drugs.   Family History:  The patient's family history includes Anxiety disorder in her father; COPD in her maternal grandfather; Cancer in  her maternal aunt; Depression in her mother; Heart failure in her maternal grandmother; Hypertension in her father and maternal grandmother; Post-traumatic stress disorder in her father.    ROS:  General:no colds or fevers, + weight loss Skin:no rashes or ulcers HEENT:no blurred vision, no congestion CV:see HPI PUL:see HPI GI:no diarrhea constipation or melena, no indigestion GU:no hematuria, no dysuria MS:no joint pain, no claudication Neuro:no syncope, no lightheadedness Endo:no diabetes, no thyroid disease  Wt Readings from Last 3 Encounters:  05/21/17 156 lb (70.8 kg)  01/25/17 155 lb 8 oz (70.5 kg)  01/01/17 156 lb 3.2 oz (70.9 kg)     PHYSICAL EXAM: VS:  BP 122/78   Pulse 68   Ht   (1.651 m)   Wt 156 lb (70.8 kg)   SpO2 99%   BMI 25.96 kg/m  , BMI Body mass index is 25.96 kg/m. General:Pleasant  Skin:Warm and dry, brisk capillary refill HEENT:normocephalic, sclera clear, mucus membranes moist Neck:supple, no JVD, no bruits  Heart:S1S2 RRR without murmur, gallup, rub or click Lungs:clear without rales, rhonchi, or wheezes ZOX:WRUE, non tender, + BS, do not palpate liver spleen or masses Ext:no lower ext edema, 2+ pedal pulses, 2+ radial pulses Neuro:alert and oriented X 3, MAE, follows commands, + facial symmetry    EKG:  EKG is NOT  ordered today.    Recent Labs: 09/11/2016: BUN 14; Creatinine, Ser 0.87; Hemoglobin 12.5; Platelets 272; Potassium 4.2; Sodium 137; TSH 0.939 09/30/2016: NT-Pro BNP 61    Lipid Panel    Component Value Date/Time   CHOL 181 06/27/2015 1637   TRIG 38 06/27/2015 1637   HDL 84 06/27/2015 1637   CHOLHDL 2.2 06/27/2015 1637   VLDL 8 06/27/2015 1637   LDLCALC 89 06/27/2015 1637       Other studies Reviewed: Additional studies/ records that were reviewed today include:  Echo 09/15/16 Study Conclusions  - Left ventricle: The cavity size was normal. Systolic function was normal. The estimated ejection fraction was in the range of 55% to 60%. Wall motion was normal; there were no regional wall motion abnormalities. Left ventricular diastolic function parameters were normal. - Aortic valve: Transvalvular velocity was within the normal range. There was no stenosis. There was no regurgitation. - Mitral valve: Transvalvular velocity was within the normal range. There was no evidence for stenosis. There was trivial regurgitation. - Left atrium: The atrium was mildly dilated. - Right ventricle: The cavity size was normal. Wall thickness was normal. Systolic function was normal. - Tricuspid valve: There was trivial regurgitation. - Pulmonary arteries: Systolic pressure was within the normal range. PA peak  pressure: 26 mm Hg (S).   ETT Study Highlights    Blood pressure demonstrated a normal response to exercise.  Pt wallked for 7:48 on A Bruce GXT . Peak HR if 169 which is 91 % predicted HR. There were no ST or T wave changes to suggest ischemia.  BP response was normal .  Negative GXT    Out pt monitor  Monitor shows SR with isolated PACs Symptoms of palpitations did not always correlate with skeps Could try low dose toprol 25 mg per day to see if helps         ASSESSMENT AND PLAN:  1.  HTN Not optimal   I would recomm addiding NifedipineXL 30 mg   (pt wants something safe if becomes pregneant)   Continue metoprolol.   F/U with BP in My Chart  therwise in clinic in late fall  2.   Plalpations Keep on Torprol        Current medicines are reviewed with the patient today.  The patient Has no concerns regarding medicines.  The following changes have been made:  See above Labs/ tests ordered today include:see above  Disposition:   FU:  see above  Signed, Dietrich Pates, MD  05/21/2017 9:48 AM    [facility] [address] [place], [place], [place]  [address]/ [address] [place], [place] Phone: [phone]; Fax: [phone]  [phone]

## 2017-05-27 ENCOUNTER — Encounter: Payer: Self-pay | Admitting: Internal Medicine

## 2017-06-02 ENCOUNTER — Telehealth: Payer: Self-pay | Admitting: Internal Medicine

## 2017-06-02 NOTE — Telephone Encounter (Signed)
I hav received emails from pt re edema and BP  I have also received communication from pharmacy who has reviewed meds as well  With potential trying to get pregnant would recomm switching from metoprolol to labetalol 200 bid Would stop nifedipine after switch   Labetalol may be adequate and can titrate   Please call pt with plan

## 2017-06-02 NOTE — Telephone Encounter (Signed)
Left message for patient to call back  

## 2017-06-08 ENCOUNTER — Telehealth: Payer: Self-pay | Admitting: Internal Medicine

## 2017-06-08 MED ORDER — LABETALOL HCL 200 MG PO TABS: 200.0000 mg | ORAL_TABLET | Freq: Two times a day (BID) | ORAL | 11 refills | Status: DC

## 2017-06-08 NOTE — Telephone Encounter (Signed)
Spoke with patient and informed of recommendations. She will stop metoprolol and nifedipine and begin labetalol 200 mg twice daily. Scheduled appointment for follow up in HTN clinic. Advised patient to call with any concerns after making medication change. Also advised if she takes any BP/HR readings in the meantime to bring those with her to appointment with PharmD.

## 2017-06-08 NOTE — Telephone Encounter (Signed)
New Message: ° ° ° ° ° ° °Pt is returning a call °

## 2017-06-08 NOTE — Addendum Note (Signed)
Addended by: Lendon Ka on: 06/08/2017 10:13 AM   Modules accepted: Orders

## 2017-06-08 NOTE — Telephone Encounter (Signed)
See previous telephone encounter.

## 2017-06-09 ENCOUNTER — Ambulatory Visit: Payer: Medicaid Other | Admitting: Internal Medicine

## 2017-06-09 ENCOUNTER — Other Ambulatory Visit: Payer: Self-pay

## 2017-06-09 ENCOUNTER — Other Ambulatory Visit (HOSPITAL_COMMUNITY)
Admission: RE | Admit: 2017-06-09 | Discharge: 2017-06-09 | Disposition: A | Discharge status: Home / Self Care | Payer: Medicaid Other | Source: Ambulatory Visit | Attending: Family Medicine | Admitting: Family Medicine

## 2017-06-09 ENCOUNTER — Encounter: Payer: Self-pay | Admitting: Internal Medicine

## 2017-06-09 VITALS — BP 100/62 | HR 75 | Temp 98.8°F | Ht 65.0 in | Wt 146.0 lb

## 2017-06-09 DIAGNOSIS — N898 Other specified noninflammatory disorders of vagina: Secondary | ICD-10-CM

## 2017-06-09 DIAGNOSIS — A749 Chlamydial infection, unspecified: Secondary | ICD-10-CM | POA: Insufficient documentation

## 2017-06-09 DIAGNOSIS — Z113 Encounter for screening for infections with a predominantly sexual mode of transmission: Secondary | ICD-10-CM

## 2017-06-09 LAB — POCT WET PREP (WET MOUNT)
Clue Cells Wet Prep Whiff POC: NEGATIVE
Trichomonas Wet Prep HPF POC: ABSENT

## 2017-06-09 NOTE — Assessment & Plan Note (Addendum)
-   Cervicovaginal ancillary only - POCT Wet Prep [facility]) - HIV antibody (with reflex) - RPR - Pending results will treat as needed

## 2017-06-09 NOTE — Progress Notes (Signed)
   [facility] Noralee Chars, MD Phone: [phone]  Reason For Visit: SDA for Vaginal Discharge   # VAGINAL DISCHARGE  Having vaginal discharge for 3 days. Have had yeast infection in the past, look similar to previous. Yellow consistency, not constant itching - which she states is not similar previous yeast infection. Patient is on oral birth control, has not missed any dose. Does not use condoms  Medications tried: No Recent antibiotic YNW:GNFA Sex in last month:None  Possible STD exposure:No  Symptoms Fever:None  Dysuria:None  Vaginal bleeding: None  Abdomen or Pelvic pain: None  Back pain: None  Genital sores or ulcers:None  Pain during sex: None  Missed menstrual period: None   ROS see HPI Smoking Status noted   Objective: BP 100/62   Pulse 75   Temp 98.8 F (37.1 C) (Oral)   Ht  (1.651 m)   Wt 146 lb (66.2 kg)   LMP 05/10/2017   SpO2 98%   BMI 24.30 kg/m  Gen: NAD, alert, cooperative with exam GI: soft, non-tender, non-distended, bowel sounds present, no hepatomegaly, no splenomegaly GU: Able to perform a pelvic exam as patient was too tender, no abnormalities noted on external vagina   Assessment/Plan: See problem based a/p  Vaginal discharge - Cervicovaginal ancillary only - POCT Wet Prep ([facility] [place]) - HIV antibody (with reflex) - RPR - Pending results will treat as needed

## 2017-06-09 NOTE — Patient Instructions (Signed)
We will let you know about the results of your labs either through mail or by phone.

## 2017-06-10 LAB — RPR: RPR: NONREACTIVE

## 2017-06-10 LAB — HIV ANTIBODY (ROUTINE TESTING W REFLEX): HIV SCREEN 4TH GENERATION: NONREACTIVE

## 2017-06-10 LAB — CERVICOVAGINAL ANCILLARY ONLY
Chlamydia: POSITIVE — AB
NEISSERIA GONORRHEA: NEGATIVE

## 2017-06-11 ENCOUNTER — Telehealth: Payer: Self-pay | Admitting: Internal Medicine

## 2017-06-11 DIAGNOSIS — A749 Chlamydial infection, unspecified: Secondary | ICD-10-CM | POA: Insufficient documentation

## 2017-06-11 MED ORDER — AZITHROMYCIN 500 MG PO TABS: 1000.0000 mg | ORAL_TABLET | Freq: Once | ORAL | 0 refills | Status: AC

## 2017-06-11 NOTE — Telephone Encounter (Signed)
Called patient to let her know she has an STD. Will treat with azithromycin. Follow up as needed.

## 2017-06-14 ENCOUNTER — Telehealth: Payer: Self-pay

## 2017-06-14 ENCOUNTER — Other Ambulatory Visit: Payer: Self-pay | Admitting: *Deleted

## 2017-06-14 DIAGNOSIS — Z3041 Encounter for surveillance of contraceptive pills: Secondary | ICD-10-CM

## 2017-06-14 MED ORDER — NORGESTIMATE-ETH ESTRADIOL 0.25-35 MG-MCG PO TABS: 1.0000 | ORAL_TABLET | Freq: Every day | ORAL | 12 refills | Status: DC

## 2017-06-14 NOTE — Telephone Encounter (Signed)
Pt treated for chlamydia last week, now complaining of vaginal irritation. Used OTC monistat with no relief. Wanting to know if we can call something in for her. Call back [phone] Shawna OrleansMeredith B Maryjo Ragon, RN

## 2017-06-15 NOTE — Telephone Encounter (Signed)
Patient left another message about this. Believes she has a yeast infection from Azithromycin. Requests Rx for this.  Call back is [phone]  Ples SpecterAlisa Lilla Callejo, RN [facility] RN)

## 2017-06-16 ENCOUNTER — Other Ambulatory Visit: Payer: Self-pay | Admitting: Family Medicine

## 2017-06-16 MED ORDER — FLUCONAZOLE 150 MG PO TABS: 150.0000 mg | ORAL_TABLET | Freq: Once | ORAL | 0 refills | Status: AC

## 2017-06-16 NOTE — Telephone Encounter (Signed)
I have sent in Diflucan to [facility] on [address] for her.  Please inform pt to take this tablet.  If she continues to have symptoms after treatment, will need to come in and be seen.

## 2017-06-16 NOTE — Telephone Encounter (Signed)
Pt can be informed that providers have a 48 hour window to reply and we had done so.

## 2017-06-16 NOTE — Telephone Encounter (Signed)
Pt informed. Pt stated that she didn't know hwy it took us so long to respond. Janet Hughes Bruna PotterBlount, CMA

## 2017-06-28 ENCOUNTER — Ambulatory Visit (INDEPENDENT_AMBULATORY_CARE_PROVIDER_SITE_OTHER): Payer: Medicaid Other | Admitting: Pharmacist

## 2017-06-28 ENCOUNTER — Encounter: Payer: Self-pay | Admitting: Pharmacist

## 2017-06-28 DIAGNOSIS — I10 Essential (primary) hypertension: Secondary | ICD-10-CM | POA: Insufficient documentation

## 2017-06-28 NOTE — Progress Notes (Signed)
Patient ID: Janet Hughes                 DOB: 03/20/1980                      MRN: [id number]     HPI: Janet Hughes is a 37 y.o. female patient of Dr. Tenny Crawoss who presents today for hypertension evaluation. PMH significant for HTN and PACs. (holter monitor). At her most recent OV she was started on nifedipine XL 30mg  daily for high blood pressure. The patient preferred an agent that would be safe should she become pregnant. However, she developed swelling with the Nifedipine and this was changed to labetalol.   She presents today for blood pressure management. She states that she has done well since restarting her labetalol. She states she did without it for a week as she had some urinary retention and lethargy, but she restarted about a week ago and has done extremely well since then. She denies dizziness and fatigue now.   Current HTN meds:  Labetalol 200mg  BID (8am and 8pm) - restarted last week   Previously tried: nifedipine - edema  BP goal: <130/80  Family History: Anxiety disorder in her father; COPD in her maternal grandfather; Cancer in her maternal aunt; Depression in her mother; Heart failure in her maternal grandmother; Hypertension in her father and maternal grandmother; Post-traumatic stress disorder in her father.   Social History: The patient  reports that she quit smoking about several years ago. Her smoking use included cigarettes and e-cigarettes. She smoked 0.30 packs per day. She has never used smokeless tobacco. She reports that she does not drink alcohol or use drugs  Diet: She eats out and from home. She usually eats at sit down restaurants. She does not use salt when she eats out but does use some at home. She eats meats and vegetables. She drinks mostly water. She drinks 3 cup of coffee per week (when she works). She 1-2 glasses of soda per week (spirte not dark sodas).   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   Home BP readings: 130/87 is highest - generally lower in morning 117/82. HR in 70s  Wt Readings from Last 3 Encounters:  06/09/17 146 lb (66.2 kg)  05/21/17 156 lb (70.8 kg)  01/25/17 155 lb 8 oz (70.5 kg)   BP Readings from Last 3 Encounters:  06/28/17 126/88  06/09/17 100/62  05/21/17 122/78   Pulse Readings from Last 3 Encounters:  06/28/17 73  06/09/17 75  05/21/17 68    Renal function: CrCl cannot be calculated (Patient's most recent lab result is older than the maximum 21 days allowed.).  Past Medical History:  Diagnosis Date  . Abortion, therapeutic 12/01  . ADHD (attention deficit hyperactivity disorder) 08/07/2014  . Anxiety   . Breast lump in female 06/27/2015  . Chlamydia infection    repeated infections  . 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 Dysuria 12/20/2009   Qualifier: Diagnosis of  By: Humberto SealsSaxon NP, Darl PikesSusan    . Ectopic pregnancy 09/08/2014  . HEMORRHOIDS 11/15/2007   Qualifier: Diagnosis of  By: Humberto SealsSaxon NP, Darl PikesSusan    . History of stress test    GXT 9/18: normal   . Insomnia 01/03/2013  . Oral contraceptive prescribed 02/09/2014  . Palpitations 09/15/2016  . Post partum depression   .  Rash and nonspecific skin eruption 07/22/2015  . Reactive cervical lymphadenopathy 01/20/2011  . SORE THROAT 02/10/2010   Qualifier: Diagnosis of  By: Humberto Seals NP, Darl Pikes    . Tobacco abuse 04/28/2011  . UNSPECIFIED VAGINITIS AND VULVOVAGINITIS 12/20/2009   Qualifier: Diagnosis of  By: Humberto Seals NP, Darl Pikes    . Vaginal discharge 11/11/2014  . Vaginitis 07/27/2011  . Yeast infection 07/19/2012    Current Outpatient Medications on File Prior to Visit  Medication Sig Dispense Refill  . ALPRAZolam (XANAX) 0.5 MG tablet Take 1 tablet (0.5 mg total) by mouth at bedtime as needed for sleep. 30 tablet 2  . labetalol (NORMODYNE) 200 MG tablet Take 1 tablet (200 mg total) by mouth 2 (two) times daily. 60 tablet 11  .  lisdexamfetamine (VYVANSE) 30 MG capsule Take 1 capsule (30 mg total) by mouth daily. 30 capsule 0  . MELATONIN GUMMIES PO Take 5 mg by mouth 2 (two) times daily.    . Multiple Vitamins-Minerals (HAIR VITAMINS PO) Take 3,000 mcg by mouth 3 (three) times daily.    . norgestimate-ethinyl estradiol (ESTARYLLA) 0.25-35 MG-MCG tablet Take 1 tablet by mouth daily. 28 tablet 12   No current facility-administered medications on file prior to visit.     No Known Allergies  Blood pressure 126/88, pulse 73.   Assessment/Plan: Hypertension: BP today is borderline at goal and improved on labetalol. Will continue same dose 200mg  BID for now since just restarted last week and follow up in 6-8 weeks in HTN clinic. Have asked she record measurements and bring with her to follow up.   Thank you, Freddie Apley. Cleatis Polka, PharmD  [facility]  06/28/2017 2:18 PM

## 2017-06-28 NOTE — Patient Instructions (Signed)
Return for a follow up appointment in 6-8 weeks  Your blood pressure goal is less than 130/80  Check your blood pressure at home daily (if able) and keep record of the readings.  Take your BP meds as follows: CONTINUE labetalol 200mg  TWICE daily   Bring all of your meds, your BP cuff and your record of home blood pressures to your next appointment.  Exercise as you're able, try to walk approximately 30 minutes per day.  Keep salt intake to a minimum, especially watch canned and prepared boxed foods.  Eat more fresh fruits and vegetables and fewer canned items.  Avoid eating in fast food restaurants.    HOW TO TAKE YOUR BLOOD PRESSURE: . Rest 5 minutes before taking your blood pressure. .  Don't smoke or drink caffeinated beverages for at least 30 minutes before. . Take your blood pressure before (not after) you eat. . Sit comfortably with your back supported and both feet on the floor (don't cross your legs). . Elevate your arm to heart level on a table or a desk. . Use the proper sized cuff. It should fit smoothly and snugly around your bare upper arm. There should be enough room to slip a fingertip under the cuff. The bottom edge of the cuff should be 1 inch above the crease of the elbow. . Ideally, take 3 measurements at one sitting and record the average.

## 2017-07-29 ENCOUNTER — Other Ambulatory Visit: Payer: Self-pay | Admitting: *Deleted

## 2017-07-31 MED ORDER — LISDEXAMFETAMINE DIMESYLATE 30 MG PO CAPS: 30.0000 mg | ORAL_CAPSULE | Freq: Every day | ORAL | 0 refills | Status: DC

## 2017-08-23 ENCOUNTER — Ambulatory Visit: Payer: Medicaid Other

## 2017-09-08 NOTE — Progress Notes (Deleted)
Patient ID: Janet Hughes                 DOB: May 22, 1980                      MRN: [id number]     HPI: Janet Hughes is a 37 y.o. female patient of Dr. Tenny Craw who presents today for hypertension evaluation. PMH significant for HTN and PACs. (holter monitor). She prefers BP medications that would be safe in pregnancy. She was previously on nifedipine however she developed swelling and was switched to labetolol. Patient was most recently seen in HTN clinic on 06/28/17 and her BP at that visit was 126/88. Her labetolol was kept at 200 mg BID as she had just started it a week before her clinic visit.  She presents today for blood pressure management.   - ask about home BP - ask if still trying to become pregnant - SOB, dizziness, chest pain  Current HTN meds:  Labetalol 200mg  BID (8am and 8pm)   Previously tried: nifedipine - edema  BP goal: <130/80  Family History: Anxiety disorder in her father; COPD in her maternal grandfather; Cancer in her maternal aunt; Depression in her mother; Heart failure in her maternal grandmother; Hypertension in her father and maternal grandmother; Post-traumatic stress disorder in her father.   Social History: The patient  reports that she quit smoking about several years ago. Her smoking use included cigarettes and e-cigarettes. She smoked 0.30 packs per day. She has never used smokeless tobacco. She reports that she does not drink alcohol or use drugs  Diet: She eats out and from home. She usually eats at sit down restaurants. She does not use salt when she eats out but does use some at home. She eats meats and vegetables. She drinks mostly water. She drinks 3 cup of coffee per week (when she works). She 1-2 glasses of soda per week (spirte not dark sodas).   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   Home BP readings: 130/87 is highest - generally lower in morning 117/82. HR in 70s  Wt Readings  from Last 3 Encounters:  06/09/17 146 lb (66.2 kg)  05/21/17 156 lb (70.8 kg)  01/25/17 155 lb 8 oz (70.5 kg)   BP Readings from Last 3 Encounters:  06/28/17 126/88  06/09/17 100/62  05/21/17 122/78   Pulse Readings from Last 3 Encounters:  06/28/17 73  06/09/17 75  05/21/17 68    Renal function: CrCl cannot be calculated (Patient's most recent lab result is older than the maximum 21 days allowed.).  Past Medical History:  Diagnosis Date  . Abortion, therapeutic 12/01  . ADHD (attention deficit hyperactivity disorder) 08/07/2014  . Anxiety   . Breast lump in female 06/27/2015  . Chlamydia infection    repeated infections  . 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 Dysuria 12/20/2009   Qualifier: Diagnosis of  By: Humberto Seals NP, Darl Pikes    . Ectopic pregnancy 09/08/2014  . HEMORRHOIDS 11/15/2007   Qualifier: Diagnosis of  By: Humberto Seals NP, Darl Pikes    . History of stress test    GXT 9/18: normal   . Insomnia 01/03/2013  . Oral contraceptive prescribed 02/09/2014  . Palpitations 09/15/2016  . Post partum depression   . Rash and nonspecific skin eruption 07/22/2015  . Reactive cervical lymphadenopathy 01/20/2011  .  SORE THROAT 02/10/2010   Qualifier: Diagnosis of  By: Humberto SealsSaxon NP, Darl PikesSusan    . Tobacco abuse 04/28/2011  . UNSPECIFIED VAGINITIS AND VULVOVAGINITIS 12/20/2009   Qualifier: Diagnosis of  By: Humberto SealsSaxon NP, Darl PikesSusan    . Vaginal discharge 11/11/2014  . Vaginitis 07/27/2011  . Yeast infection 07/19/2012    Current Outpatient Medications on File Prior to Visit  Medication Sig Dispense Refill  . ALPRAZolam (XANAX) 0.5 MG tablet Take 1 tablet (0.5 mg total) by mouth at bedtime as needed for sleep. 30 tablet 2  . labetalol (NORMODYNE) 200 MG tablet Take 1 tablet (200 mg total) by mouth 2 (two) times daily. 60 tablet 11  . lisdexamfetamine (VYVANSE) 30 MG capsule Take 1 capsule (30 mg total) by mouth daily. 30 capsule 0  . MELATONIN GUMMIES PO  Take 5 mg by mouth 2 (two) times daily.    . Multiple Vitamins-Minerals (HAIR VITAMINS PO) Take 3,000 mcg by mouth 3 (three) times daily.    . norgestimate-ethinyl estradiol (ESTARYLLA) 0.25-35 MG-MCG tablet Take 1 tablet by mouth daily. 28 tablet 12   No current facility-administered medications on file prior to visit.     No Known Allergies  There were no vitals taken for this visit.   Assessment/Plan: Hypertension:   Thank you, Janet Hughes, PharmD  [facility]  09/08/2017 3:46 PM  Patient seen with Thomes CakeWendy Sun, PharmD Candidate

## 2017-09-09 ENCOUNTER — Ambulatory Visit: Payer: Medicaid Other

## 2017-09-23 ENCOUNTER — Telehealth: Payer: Self-pay

## 2017-09-23 NOTE — Telephone Encounter (Signed)
Pt called nurse line requesting a letter stating that she has a rx for vyvanse. Will be taking a drug test for employment and needs proof of Rx. Also requesting immunization records. Pt call back [phone] Shawna OrleansMeredith B Thomsen, RN

## 2017-09-27 NOTE — Telephone Encounter (Signed)
Pt calling again about this letter and immunization records. Please call pt at the phone number below once this has been done. Please advise

## 2017-09-29 ENCOUNTER — Encounter: Payer: Self-pay | Admitting: Family Medicine

## 2017-09-29 NOTE — Telephone Encounter (Signed)
Called patient and left VM that I have written this letter and am leaving both documents at front desk for pick up.

## 2017-10-04 NOTE — Progress Notes (Deleted)
Patient ID: Janet Faillizabeth B Mcnairy                 DOB: 07/17/1980                      MRN: [id number]     HPI: Janet Faillizabeth B Nebergall is a 37 y.o. female patient of Dr. Tenny Crawoss who presents today for hypertension follow up. PMH is significant for HTN, PACs, ADHD, and tobacco abuse. She was started on nifedipine XL 30mg  daily for high blood pressure in May 2019. She preferred an agent that would be safe should she become pregnant. However, she developed swelling with nifedipine and was switched to labetalol. She followed up in HTN clinic 1 month later. BP was close to goal and no medication changes were made as pt had interrupted and resumed therapy 1 week prior to office visit.  Inc labetalol to 400mg  BID if needed  Current HTN meds:  Labetalol 200mg  BID (8am and 8pm) - restarted last week   Previously tried: nifedipine - edema  BP goal: <130/4380mmHg  Family History: Anxiety disorder in her father; COPD in her maternal grandfather; Cancer in her maternal aunt; Depression in her mother; Heart failure in her maternal grandmother; Hypertension in her father and maternal grandmother; Post-traumatic stress disorder in her father.   Social History: The patient  reports that she quit smoking about several years ago. Her smoking use included cigarettes and e-cigarettes. She smoked 0.30 packs per day. She has never used smokeless tobacco. She reports that she does not drink alcohol or use drugs  Diet: She eats out and from home. She usually eats at sit down restaurants. She does not use salt when she eats out but does use some at home. She eats meats and vegetables. She drinks mostly water. She drinks 3 cup of coffee per week (when she works). She 1-2 glasses of soda per week (spirte not dark sodas).   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   Home BP readings: 130/87 is highest - generally lower in morning 117/82. HR in 70s  Wt Readings from Last 3  Encounters:  06/09/17 146 lb (66.2 kg)  05/21/17 156 lb (70.8 kg)  01/25/17 155 lb 8 oz (70.5 kg)   BP Readings from Last 3 Encounters:  06/28/17 126/88  06/09/17 100/62  05/21/17 122/78   Pulse Readings from Last 3 Encounters:  06/28/17 73  06/09/17 75  05/21/17 68    Renal function: CrCl cannot be calculated (Patient's most recent lab result is older than the maximum 21 days allowed.).  Past Medical History:  Diagnosis Date  . Abortion, therapeutic 12/01  . ADHD (attention deficit hyperactivity disorder) 08/07/2014  . Anxiety   . Breast lump in female 06/27/2015  . Chlamydia infection    repeated infections  . 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 Dysuria 12/20/2009   Qualifier: Diagnosis of  By: Humberto SealsSaxon NP, Darl PikesSusan    . Ectopic pregnancy 09/08/2014  . HEMORRHOIDS 11/15/2007   Qualifier: Diagnosis of  By: Humberto SealsSaxon NP, Darl PikesSusan    . History of stress test    GXT 9/18: normal   . Insomnia 01/03/2013  . Oral contraceptive prescribed 02/09/2014  . Palpitations 09/15/2016  . Post partum depression   . Rash and nonspecific skin eruption 07/22/2015  . Reactive cervical lymphadenopathy 01/20/2011  . SORE THROAT 02/10/2010  Qualifier: Diagnosis of  By: Humberto Seals NP, Darl Pikes    . Tobacco abuse 04/28/2011  . UNSPECIFIED VAGINITIS AND VULVOVAGINITIS 12/20/2009   Qualifier: Diagnosis of  By: Humberto Seals NP, Darl Pikes    . Vaginal discharge 11/11/2014  . Vaginitis 07/27/2011  . Yeast infection 07/19/2012    Current Outpatient Medications on File Prior to Visit  Medication Sig Dispense Refill  . ALPRAZolam (XANAX) 0.5 MG tablet Take 1 tablet (0.5 mg total) by mouth at bedtime as needed for sleep. 30 tablet 2  . labetalol (NORMODYNE) 200 MG tablet Take 1 tablet (200 mg total) by mouth 2 (two) times daily. 60 tablet 11  . lisdexamfetamine (VYVANSE) 30 MG capsule Take 1 capsule (30 mg total) by mouth daily. 30 capsule 0  . MELATONIN GUMMIES PO Take 5 mg by  mouth 2 (two) times daily.    . Multiple Vitamins-Minerals (HAIR VITAMINS PO) Take 3,000 mcg by mouth 3 (three) times daily.    . norgestimate-ethinyl estradiol (ESTARYLLA) 0.25-35 MG-MCG tablet Take 1 tablet by mouth daily. 28 tablet 12   No current facility-administered medications on file prior to visit.     No Known Allergies  There were no vitals taken for this visit.   Assessment/Plan:  1. Hypertension -

## 2017-10-05 ENCOUNTER — Ambulatory Visit: Payer: Self-pay | Admitting: Pharmacist

## 2017-10-27 ENCOUNTER — Ambulatory Visit (INDEPENDENT_AMBULATORY_CARE_PROVIDER_SITE_OTHER): Payer: Medicaid Other | Admitting: Family Medicine

## 2017-10-27 ENCOUNTER — Other Ambulatory Visit: Payer: Self-pay

## 2017-10-27 VITALS — BP 110/70 | HR 83 | Temp 98.5°F | Wt 155.0 lb

## 2017-10-27 DIAGNOSIS — N898 Other specified noninflammatory disorders of vagina: Secondary | ICD-10-CM | POA: Diagnosis not present

## 2017-10-27 DIAGNOSIS — R399 Unspecified symptoms and signs involving the genitourinary system: Secondary | ICD-10-CM | POA: Diagnosis present

## 2017-10-27 LAB — POCT URINALYSIS DIP (MANUAL ENTRY)
BILIRUBIN UA: NEGATIVE
Blood, UA: NEGATIVE
GLUCOSE UA: NEGATIVE mg/dL
Ketones, POC UA: NEGATIVE mg/dL
LEUKOCYTES UA: NEGATIVE
NITRITE UA: POSITIVE — AB
PH UA: 5.5 (ref 5.0–8.0)
Protein Ur, POC: NEGATIVE mg/dL
Spec Grav, UA: >=1.03 — AB (ref 1.010–1.025)
Urobilinogen, UA: 0.2 E.U./dL

## 2017-10-27 LAB — POCT WET PREP (WET MOUNT)
Clue Cells Wet Prep Whiff POC: NEGATIVE
Trichomonas Wet Prep HPF POC: ABSENT

## 2017-10-27 LAB — POCT UA - MICROSCOPIC ONLY

## 2017-10-27 MED ORDER — NITROFURANTOIN MONOHYD MACRO 100 MG PO CAPS: 100.0000 mg | ORAL_CAPSULE | Freq: Two times a day (BID) | ORAL | 0 refills | Status: DC

## 2017-10-27 NOTE — Progress Notes (Signed)
    Subjective:    Patient ID: Janet Hughes, female    DOB: 1980/07/17, 37 y.o.   MRN: [id number]   CC: uti symptoms  HPI: patient states she noticed her urine was dark and foul smelling about 4 days ago. She also has had some bladder spasms. She denies increased frequency and it does not hurt to urinate. She denies flank pain, nausea, vomiting, fevers and chills.  She also has vaginal discharge but states it is similar to her normal discharge. It is white, does not smell, no vaginal irritation associated with it. She is sexually active with 1 partner and they use condoms during intercourse. She is not concerned for any STD at this time. She reports if anything the discharge may be BV but doubts this. She still would like to confirm with lab testing today.   Smoking status reviewed- former smoker  Review of Systems- see HPI   Objective:  BP 110/70   Pulse 83   Temp 98.5 F (36.9 C) (Oral)   Wt 155 lb (70.3 kg)   LMP 10/05/2017 (Approximate)   SpO2 95%   BMI 25.79 kg/m  Vitals and nursing note reviewed  General: well nourished, in no acute distress HEENT: normocephalic, MMM Cardiac: regular rate Respiratory: no increased work of breathing Abdomen: no CVA tenderness Neuro: alert and oriented, no focal deficits   Assessment & Plan:    UTI symptoms  Foul smelling urine with + nitrates on dip and bacteria on micro. Will treat w/ macrobid BID for 3 days and send urine for culture. Will let patient know about culture results. Return precautions given. Patient verbalized understanding and agreement with plan.   Vaginal discharge  Wet prep negative for BV, yeast, trich. Follow up as needed     Return if symptoms worsen or Hughes to improve.   Dolores Patty, DO Family Medicine Resident PGY-3

## 2017-10-27 NOTE — Patient Instructions (Addendum)
  Good to see you today Please take the macrobid twice a day for 3 days We will call you if the culture comes back telling us we need to change to a different antibiotic. If you have worsening pain or fevers or chills please return to be seen. If you have questions or concerns please do not hesitate to call at [phone].  Dolores Patty, DO PGY-3, [facility] 10/27/2017 10:12 AM

## 2017-10-27 NOTE — Assessment & Plan Note (Signed)
  Wet prep negative for BV, yeast, trich. Follow up as needed

## 2017-10-27 NOTE — Assessment & Plan Note (Signed)
  Foul smelling urine with + nitrates on dip and bacteria on micro. Will treat w/ macrobid BID for 3 days and send urine for culture. Will let patient know about culture results. Return precautions given. Patient verbalized understanding and agreement with plan.

## 2017-10-29 LAB — URINE CULTURE

## 2017-11-01 ENCOUNTER — Telehealth: Payer: Self-pay | Admitting: Family Medicine

## 2017-11-01 NOTE — Telephone Encounter (Signed)
Called [name] about urine culture results, sensitive to macrobid. She reports symptoms resolved w/ treatment. All questions answered.   Dolores Patty, DO PGY-3, [facility] 11/01/2017 2:48 PM

## 2017-12-31 ENCOUNTER — Encounter: Payer: Self-pay | Admitting: Physician Assistant

## 2018-01-18 ENCOUNTER — Telehealth: Payer: Self-pay | Admitting: Pharmacist

## 2018-01-18 ENCOUNTER — Ambulatory Visit: Payer: Self-pay

## 2018-01-18 MED ORDER — LABETALOL HCL 200 MG PO TABS: 400.0000 mg | ORAL_TABLET | Freq: Two times a day (BID) | ORAL | 11 refills | Status: DC

## 2018-01-18 NOTE — Telephone Encounter (Signed)
Spoke with pt on the phone regarding her BP. She had f/u scheduled this afternoon in HTN clinic however is also due to see [name], PA in 1 week. BP at last OV was at goal, also at goal during PCP visit in October.  Pt reports elevated BP at home, 142/90 this morning. Typically has been running above goal <130/80. HR has been normal, however she has still been experiencing some palpitations. Will increase labetalol to 400mg  BID and pt will keep follow up visit with Scott in 1 week. BP response will be assessed at that time.

## 2018-01-18 NOTE — Progress Notes (Deleted)
Patient ID: Janet Hughes                 DOB: 07-16-1980                      MRN: [id number]     HPI: Janet Hughes is a 38 y.o. female referred by Dr. Tenny Craw to HTN clinic. PMH is significant for HTN and PACs. (holter monitor). She was last seen in clinic in June 2019 and BP was borderline at goal on labetalol 200 mg BID. No changes were made at the time because patient had re-started on labetalol a week before the visit. Of note, the patient prefers an agent that would be safe should she become pregnant. She presents to clinic for BP follow up.  Dizziness, falls, vision changes, headache, imbalance? Home BP log  Current HTN meds: Labetalol 200 mg BID (8am and 8pm) Previously tried: nifedipine - edema BP goal: <130/80  Family History: Anxiety disorder in her father; COPD in her maternal grandfather; Cancer in her maternal aunt; Depression in her mother; Heart failure in her maternal grandmother; Hypertension in her father and maternal grandmother; Post-traumatic stress disorder in her father.  Social History: The patientreports that she quit smoking about several years ago. Her smoking use included cigarettes and e-cigarettes. She smoked 0.30 packs per day. She has never used smokeless tobacco. She reports that she does not drink alcohol or use drugs  Diet: She eats out and from home. She usually eats at sit down restaurants. She does not use salt when she eats out but does use some at home. She eats meats and vegetables. She drinks mostly water. She drinks 3 cup of coffee per week (when she works). She 1-2 glasses of soda per week (spirte not dark sodas).   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   Home BP readings:   Wt Readings from Last 3 Encounters:  10/27/17 155 lb (70.3 kg)  06/09/17 146 lb (66.2 kg)  05/21/17 156 lb (70.8 kg)   BP Readings from Last 3 Encounters:  10/27/17 110/70  06/28/17 126/88  06/09/17  100/62   Pulse Readings from Last 3 Encounters:  10/27/17 83  06/28/17 73  06/09/17 75    Renal function: CrCl cannot be calculated (Patient's most recent lab result is older than the maximum 21 days allowed.).  Past Medical History:  Diagnosis Date  . Abortion, therapeutic 12/01  . ADHD (attention deficit hyperactivity disorder) 08/07/2014  . Anxiety   . Breast lump in female 06/27/2015  . Chlamydia infection    repeated infections  . 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 Dysuria 12/20/2009   Qualifier: Diagnosis of  By: Humberto Seals NP, Darl Pikes    . Ectopic pregnancy 09/08/2014  . HEMORRHOIDS 11/15/2007   Qualifier: Diagnosis of  By: Humberto Seals NP, Darl Pikes    . History of stress test    GXT 9/18: normal   . Insomnia 01/03/2013  . Oral contraceptive prescribed 02/09/2014  . Palpitations 09/15/2016  . Post partum depression   . Rash and nonspecific skin eruption 07/22/2015  . Reactive cervical lymphadenopathy 01/20/2011  . SORE THROAT 02/10/2010   Qualifier: Diagnosis of  By: Humberto Seals NP, Darl Pikes    . Tobacco abuse 04/28/2011  . UNSPECIFIED VAGINITIS AND VULVOVAGINITIS 12/20/2009   Qualifier: Diagnosis of  By: Humberto Seals NP, Darl Pikes    .  Vaginal discharge 11/11/2014  . Vaginitis 07/27/2011  . Yeast infection 07/19/2012    Current Outpatient Medications on File Prior to Visit  Medication Sig Dispense Refill  . ALPRAZolam (XANAX) 0.5 MG tablet Take 1 tablet (0.5 mg total) by mouth at bedtime as needed for sleep. 30 tablet 2  . labetalol (NORMODYNE) 200 MG tablet Take 1 tablet (200 mg total) by mouth 2 (two) times daily. 60 tablet 11  . lisdexamfetamine (VYVANSE) 30 MG capsule Take 1 capsule (30 mg total) by mouth daily. 30 capsule 0  . MELATONIN GUMMIES PO Take 5 mg by mouth 2 (two) times daily.    . Multiple Vitamins-Minerals (HAIR VITAMINS PO) Take 3,000 mcg by mouth 3 (three) times daily.    . nitrofurantoin, macrocrystal-monohydrate, (MACROBID) 100  MG capsule Take 1 capsule (100 mg total) by mouth 2 (two) times daily. 6 capsule 0  . norgestimate-ethinyl estradiol (ESTARYLLA) 0.25-35 MG-MCG tablet Take 1 tablet by mouth daily. 28 tablet 12   No current facility-administered medications on file prior to visit.     No Known Allergies   Assessment/Plan:  1. Hypertension -   Seen in clinic by Marilu FavreJin Jung, PharmD Candidate

## 2018-01-26 ENCOUNTER — Ambulatory Visit (INDEPENDENT_AMBULATORY_CARE_PROVIDER_SITE_OTHER): Payer: Medicaid Other | Admitting: Physician Assistant

## 2018-01-26 ENCOUNTER — Encounter: Payer: Self-pay | Admitting: Physician Assistant

## 2018-01-26 VITALS — BP 122/78 | HR 71 | Ht 65.0 in | Wt 148.0 lb

## 2018-01-26 DIAGNOSIS — I491 Atrial premature depolarization: Secondary | ICD-10-CM

## 2018-01-26 DIAGNOSIS — I10 Essential (primary) hypertension: Secondary | ICD-10-CM

## 2018-01-26 NOTE — Progress Notes (Signed)
Cardiology Office Note:    Date:  01/26/2018   ID:  Janet Hughes, DOB 1980-05-03, MRN [id number]  PCP:  Freddrick March, MD  Cardiologist:  Dietrich Pates, MD   Electrophysiologist:  None   Referring MD: Freddrick March, MD   Chief Complaint  Patient presents with  . Follow-up    HTN     History of Present Illness:    Janet Hughes is a 38 y.o. female with ADD, HTN, PACs. She had improvement in symptoms on beta-blocker therapy.  Her BP was uncontrolled when she saw Dr. Tenny Craw in 5/19. She was placed on Nifedipine.  She did follow up in the HTN clinic. She had issues with swelling and the Nifedipine was stopped.  She was placed on Labetalol.  She was last seen in 06/2017.  Labetalol was increased by phone on 01/18/2018.     Janet Hughes returns for follow up.  She is here with her daughter.  Her BP was running high at home.  It is better now. She has occasional palpitations still.  She has not had chest pain, shortness of breath, syncope, leg swelling.  She did have some headaches but these have resolved since increasing her Labetalol.    Prior CV studies:   The following studies were reviewed today:  Event Monitor 09/24/16 Sinus rhythm with isolated PACs  Symptoms did not always correlate with arrhythmia  GXT 09/24/16  Blood pressure demonstrated a normal response to exercise.  Pt wallked for 7:48 on A Bruce GXT . Peak HR if 169 which is 91 % predicted HR. There were no ST or T wave changes to suggest ischemia.  BP response was normal .  Negative GXT  Echo 09/15/16 EF 55-60, no RWMA, normal diastolic function, mild LAE, trivial TR, PASP 26  Past Medical History:  Diagnosis Date  . Abortion, therapeutic 12/01  . ADHD (attention deficit hyperactivity disorder) 08/07/2014  . Anxiety   . Breast lump in female 06/27/2015  . Chlamydia infection    repeated infections  . 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 Dysuria  12/20/2009   Qualifier: Diagnosis of  By: Humberto Seals NP, Darl Pikes    . Ectopic pregnancy 09/08/2014  . HEMORRHOIDS 11/15/2007   Qualifier: Diagnosis of  By: Humberto Seals NP, Darl Pikes    . History of stress test    GXT 9/18: normal   . Insomnia 01/03/2013  . Oral contraceptive prescribed 02/09/2014  . Palpitations 09/15/2016  . Post partum depression   . Rash and nonspecific skin eruption 07/22/2015  . Reactive cervical lymphadenopathy 01/20/2011  . SORE THROAT 02/10/2010   Qualifier: Diagnosis of  By: Humberto Seals NP, Darl Pikes    . Tobacco abuse 04/28/2011  . UNSPECIFIED VAGINITIS AND VULVOVAGINITIS 12/20/2009   Qualifier: Diagnosis of  By: Humberto Seals NP, Darl Pikes    . Vaginal discharge 11/11/2014  . Vaginitis 07/27/2011  . Yeast infection 07/19/2012   Surgical Hx: The patient  has a past surgical history that includes Colposcopy; Cesarean section; Breast enhancement surgery (Bilateral, 04/26/2012); and laparotomy (N/A, 09/08/2014).   Current Medications: Current Meds  Medication Sig  . ALPRAZolam (XANAX) 0.5 MG tablet Take 1 tablet (0.5 mg total) by mouth at bedtime as needed for sleep.  Marland Kitchen labetalol (NORMODYNE) 200 MG tablet Take 2 tablets (400 mg total) by mouth 2 (two) times daily.  Marland Kitchen lisdexamfetamine (VYVANSE) 30 MG capsule Take 1 capsule (30 mg total) by mouth daily.  Marland Kitchen  MELATONIN GUMMIES PO Take 5 mg by mouth daily.   . norgestimate-ethinyl estradiol (ESTARYLLA) 0.25-35 MG-MCG tablet Take 1 tablet by mouth daily.  . Prenatal Vit-Fe Fumarate-FA (PRENATAL VITAMIN PO) Take 1 tablet by mouth daily.     Allergies:   Patient has no known allergies.   Social History   Tobacco Use  . Smoking status: Former Smoker    Packs/day: 0.30    Types: Cigarettes, E-cigarettes    Last attempt to quit: 02/26/2012    Years since quitting: 5.9  . Smokeless tobacco: Never Used  . Tobacco comment: uses vapor cig  Substance Use Topics  . Alcohol use: No    Alcohol/week: 0.0 standard drinks  . Drug use: No     Family Hx: The patient's  family history includes Anxiety disorder in her father; COPD in her maternal grandfather; Cancer in her maternal aunt; Depression in her mother; Heart failure in her maternal grandmother; Hypertension in her father and maternal grandmother; Post-traumatic stress disorder in her father. There is no history of Sudden Cardiac Death.  ROS:   Please see the history of present illness.    ROS All other systems reviewed and are negative.   EKGs/Labs/Other Test Reviewed:    EKG:  EKG is  ordered today.  The ekg ordered today demonstrates NSR, HR 71, normal axis, ectopic p wave noted in lead 3, QTc 443, similar to old ECGs  Recent Labs: No results found for requested labs within last 8760 hours.   Recent Lipid Panel Lab Results  Component Value Date/Time   CHOL 181 06/27/2015 04:37 PM   TRIG 38 06/27/2015 04:37 PM   HDL 84 06/27/2015 04:37 PM   CHOLHDL 2.2 06/27/2015 04:37 PM   LDLCALC 89 06/27/2015 04:37 PM    Physical Exam:    VS:  BP 122/78   Pulse 71   Ht 5\' 5"  (1.651 m)   Wt 148 lb (67.1 kg)   SpO2 99%   BMI 24.63 kg/m     Wt Readings from Last 3 Encounters:  01/26/18 148 lb (67.1 kg)  10/27/17 155 lb (70.3 kg)  06/09/17 146 lb (66.2 kg)     Physical Exam  Constitutional: She is oriented to person, place, and time. She appears well-developed and well-nourished. No distress.  HENT:  Head: Normocephalic and atraumatic.  Neck: Neck supple. No JVD present.  Cardiovascular: Normal rate, regular rhythm, S1 normal, S2 normal and normal heart sounds.  No murmur heard. Pulmonary/Chest: Effort normal. She has no rales.  Abdominal: Soft. There is no hepatomegaly.  Musculoskeletal:        General: No edema.  Neurological: She is alert and oriented to person, place, and time.  Skin: Skin is warm and dry.    ASSESSMENT & PLAN:    Essential hypertension The patient's blood pressure is controlled on her current regimen.  Continue current therapy.  We will arrange a BP check with  my CMA to make sure her machine is working appropriately.    Premature atrial beats She has occasional symptoms.  I advised her to take an additional Labetalol 100 mg as needed for prolonged episodes of palpitations.    Dispo:  Return in about 1 year (around 01/27/2019) for Routine Follow Up, w/ Dr. Tenny Crawoss, or Tereso NewcomerScott Ronelle Michie, PA-C.   Medication Adjustments/Labs and Tests Ordered: Current medicines are reviewed at length with the patient today.  Concerns regarding medicines are outlined above.  Tests Ordered: Orders Placed This Encounter  Procedures  . EKG  12-Lead   Medication Changes: No orders of the defined types were placed in this encounter.   Signed, Tereso NewcomerScott Ebelin Dillehay, PA-C  01/26/2018 4:18 PM    [facility] [address], [place], [place]  [address] Phone: [phone]; Fax: [phone]

## 2018-01-26 NOTE — Patient Instructions (Addendum)
Medication Instructions:  Your physician recommends that you continue on your current medications as directed. Please refer to the Current Medication list given to you today.  If you need a refill on your cardiac medications before your next appointment, please call your pharmacy.   Lab work: NONE If you have labs (blood work) drawn today and your tests are completely normal, you will receive your results only by: . MyChart Message (if you have MyChart) OR . A paper copy in the mail If you have Marland Kitchenany lab test that is abnormal or we need to change your treatment, we will call you to review the results.  Testing/Procedures: NONE  Follow-Up: At [facility], you and your health needs are our priority.  As part of our continuing mission to provide you with exceptional heart care, we have created designated Provider Care Teams.  These Care Teams include your primary Cardiologist (physician) and Advanced Practice Providers (APPs -  Physician Assistants and Nurse Practitioners) who all work together to provide you with the care you need, when you need it. You will need a follow up appointment in:  12 months.  Please call our office 2 months in advance to schedule this appointment.  You may see Dietrich PatesPaula Ross, MD or one of the following Advanced Practice Providers on your designated Care Team: Tereso NewcomerScott Weaver, PA-C Vin WorthingtonBhagat, [place]PA-C . Berton BonJanine Hammond, NP  Any Other Special Instructions Will Be Listed Below (If Applicable).  PLEASE COME IN ON A DAY SCOTT WEAVER, PAC IS IN CLINIC IN THE NEXT COUPLE OF WEEKS TO HAVE YOU BLOOD PRESSURE CHECKED. BRING YOU BLOOD PRESSURE MACHINE IN WITH YOU.

## 2018-01-31 ENCOUNTER — Other Ambulatory Visit: Payer: Self-pay

## 2018-02-01 MED ORDER — ALPRAZOLAM 0.5 MG PO TABS: 0.5000 mg | ORAL_TABLET | Freq: Every evening | ORAL | 2 refills | Status: DC | PRN

## 2018-02-01 MED ORDER — LISDEXAMFETAMINE DIMESYLATE 30 MG PO CAPS: 30.0000 mg | ORAL_CAPSULE | Freq: Every day | ORAL | 0 refills | Status: DC

## 2018-02-18 ENCOUNTER — Ambulatory Visit: Payer: Medicaid Other

## 2018-03-03 ENCOUNTER — Ambulatory Visit: Payer: Medicaid Other

## 2018-04-03 ENCOUNTER — Other Ambulatory Visit: Payer: Self-pay | Admitting: Family Medicine

## 2018-04-06 ENCOUNTER — Telehealth: Payer: Self-pay | Admitting: Family Medicine

## 2018-04-06 MED ORDER — FLUCONAZOLE 150 MG PO TABS: ORAL_TABLET | ORAL | 0 refills | Status: DC

## 2018-04-06 NOTE — Telephone Encounter (Signed)
Patient calling in regarding vaginal discharge which started on Monday.  She is pretty certain this is a yeast infection and is having similar symptoms as her previous episodes.  Vaginal discharge is white and clumpy.  Associated with itching and irritation.  She has been using Monistat cream which has not been helping.  She is currently sexually active with one partner, wears condoms and does not have any concerns for STDs.  She has not had any new partners recently.  No fever, chills, abdominal or pelvic pain.  She is interested in having a medication called in so she does not have to come into the clinic given current coronavirus epidemic.  This is reasonable, Rx: Diflucan 2 tabs sent into her pharmacy.  Advised to take a second pill if symptoms persist after 72 hours.  Return precautions discussed.

## 2018-04-12 NOTE — Telephone Encounter (Signed)
Pt calls back.  She is still having discharge and has finished the diflucan and tried monistat with no relief.  Appt made for Friday AM with ATC. Jone Baseman, CMA

## 2018-04-15 ENCOUNTER — Ambulatory Visit (INDEPENDENT_AMBULATORY_CARE_PROVIDER_SITE_OTHER): Payer: No Typology Code available for payment source | Admitting: Family Medicine

## 2018-04-15 ENCOUNTER — Other Ambulatory Visit: Payer: Self-pay

## 2018-04-15 VITALS — BP 108/62 | HR 69 | Temp 98.7°F | Ht 65.0 in | Wt 145.0 lb

## 2018-04-15 DIAGNOSIS — N898 Other specified noninflammatory disorders of vagina: Secondary | ICD-10-CM | POA: Diagnosis not present

## 2018-04-15 LAB — POCT WET PREP (WET MOUNT)
Clue Cells Wet Prep Whiff POC: NEGATIVE
Trichomonas Wet Prep HPF POC: ABSENT

## 2018-04-15 NOTE — Patient Instructions (Signed)
It was great to meet you today! Thank you for letting me participate in your care!  Today, we discussed your itching and from my exam today I did not see anything concerning. I will call you with lab results and we will discuss them over the phone. If any medication is required I will send it to the pharmacy and you can pick it up at the location of your choice.  Be well, Jules Schick, DO PGY-2, [facility]

## 2018-04-15 NOTE — Assessment & Plan Note (Signed)
Patient has had resolution of symptoms of vaginal itching with thick whitish discharge after diflucan x 2 and OTC monostat. Her wet prep today was negative for yeast, trich, or clue cells. She most likely did have a yeast infection which has now resolved. - Follow up as needed

## 2018-04-15 NOTE — Progress Notes (Signed)
     Subjective: No chief complaint on file.   HPI: Janet Hughes is a 38 y.o. presenting to clinic today to discuss the following:  Vaginal Discharge Patient states she started having vaginal discharge and itching in late March. She called in and was prescribed Diflucan x 2 for a presumed yeast infection and also given Macrobid. She took the diflucan and saw some improvement but called back as she did not have resolution of her symptoms. She was also having whitish-yellow thick discharge. She then used one day OTC treatment of Monostat and her symptoms have since improved. She is no longer having discharge and her itching is better but comes in today for testing to make sure "nothing else is going on".  She denies any fever, chills, nausea, vomiting, urinary pain/burning or increased frequency.  ROS noted in HPI.   Past Medical, Surgical, Social, and Family History Reviewed & Updated per EMR.   Pertinent Historical Findings include:   Social History   Tobacco Use  Smoking Status Former Smoker  . Packs/day: 0.30  . Types: Cigarettes, E-cigarettes  . Last attempt to quit: 02/26/2012  . Years since quitting: 6.1  Smokeless Tobacco Never Used  Tobacco Comment   uses vapor cig    Objective: BP 108/62   Pulse 69   Temp 98.7 F (37.1 C) (Oral)   Ht 5\' 5"  (1.651 m)   Wt 145 lb (65.8 kg)   SpO2 98%   BMI 24.13 kg/m  Vitals and nursing notes reviewed  Physical Exam Gen: Alert and Oriented x 3, NAD HEENT: Normocephalic, atraumatic Abd: non-distended, non-tender, soft, +bs in all four quadrants GU: Normal external genitalia, no rashes or vesicles on the labia majora or minora, normal vaginal mucosa, normal vaginal discharge, no cervical discharge, no pain. Ext: no clubbing, cyanosis, or edema Skin: warm, dry, intact, no rashes  Results for orders placed or performed in visit on 04/15/18 (from the past 72 hour(s))  POCT Wet Prep Mellody Drown [place])     Status: Abnormal   Collection Time: 04/15/18  8:50 AM  Result Value Ref Range   Source Wet Prep POC VAG    WBC, Wet Prep HPF POC 3-8    Bacteria Wet Prep HPF POC Moderate (A) Few   Clue Cells Wet Prep HPF POC None None   Clue Cells Wet Prep Whiff POC Negative Whiff    Yeast Wet Prep HPF POC None None   Trichomonas Wet Prep HPF POC Absent Absent    Assessment/Plan:  Vaginal itching Patient has had resolution of symptoms of vaginal itching with thick whitish discharge after diflucan x 2 and OTC monostat. Her wet prep today was negative for yeast, trich, or clue cells. She most likely did have a yeast infection which has now resolved. - Follow up as needed   PATIENT EDUCATION PROVIDED: See AVS    Diagnosis and plan along with any newly prescribed medication(s) were discussed in detail with this patient today. The patient verbalized understanding and agreed with the plan. Patient advised if symptoms worsen return to clinic or ER.    Orders Placed This Encounter  Procedures  . POCT Wet Prep ([address] [place])     Tim McFarland, [place] 04/15/2018, 8:45 AM PGY-2 [facility]

## 2018-04-26 ENCOUNTER — Other Ambulatory Visit: Payer: Self-pay | Admitting: *Deleted

## 2018-04-26 DIAGNOSIS — Z3041 Encounter for surveillance of contraceptive pills: Secondary | ICD-10-CM

## 2018-04-26 MED ORDER — NORGESTIMATE-ETH ESTRADIOL 0.25-35 MG-MCG PO TABS: 1.0000 | ORAL_TABLET | Freq: Every day | ORAL | 1 refills | Status: DC

## 2018-04-26 NOTE — Telephone Encounter (Signed)
Covering for Dr. Nelson Chimes while out, will provide enough refills until she returns.

## 2018-05-19 ENCOUNTER — Other Ambulatory Visit: Payer: Self-pay

## 2018-05-24 MED ORDER — LISDEXAMFETAMINE DIMESYLATE 30 MG PO CAPS: 30.0000 mg | ORAL_CAPSULE | Freq: Every day | ORAL | 0 refills | Status: DC

## 2018-05-24 MED ORDER — ALPRAZOLAM 0.5 MG PO TABS: 0.5000 mg | ORAL_TABLET | Freq: Every evening | ORAL | 2 refills | Status: DC | PRN

## 2018-07-01 ENCOUNTER — Other Ambulatory Visit: Payer: Self-pay | Admitting: Internal Medicine

## 2018-07-13 ENCOUNTER — Ambulatory Visit (INDEPENDENT_AMBULATORY_CARE_PROVIDER_SITE_OTHER): Payer: No Typology Code available for payment source | Admitting: Family Medicine

## 2018-07-13 ENCOUNTER — Other Ambulatory Visit: Payer: Self-pay

## 2018-07-13 VITALS — BP 112/62 | HR 74

## 2018-07-13 DIAGNOSIS — H9201 Otalgia, right ear: Secondary | ICD-10-CM

## 2018-07-13 MED ORDER — MOMETASONE FUROATE 50 MCG/ACT NA SUSP: 2.0000 | Freq: Every day | NASAL | 12 refills | Status: DC

## 2018-07-13 NOTE — Patient Instructions (Addendum)
It was great to see you today! Thank you for letting me participate in your care!  Today, we discussed your ear pain and feeling of being stopped up. You did have significant ear wax impaction that we treated with irrigation. You should feel better but if you do not please return to the clinic.  For you sinus pain and pressure please continue using Claritin OTC and I have also sent in for some Nasonex 2 sprays into each nostril as needed to help with allergies.  Be well, Harolyn Rutherford, DO PGY-2, [facility]   Ear Irrigation Ear irrigation is a procedure to wash dirt and wax out of your ear canal. This procedure is also called lavage. You may need ear irrigation if you are having trouble hearing because of a buildup of earwax. You may also have ear irrigation as part of the treatment for an ear infection. Getting wax and dirt out of your ear canal can help some medicines (ear drops) work better. How is ear irrigation performed? The procedure may vary among health care providers and hospitals. In general:  You may be given ear drops to put in your ear 15-20 minutes before irrigation. This helps loosen the wax.  A syringe containing water or a sterile salt solution (saline) can be gently inserted into the ear canal. The saline is used to flush out wax and other debris. Ear irrigation kits are also available for use at home. Ask your health care provider if this is an option for you. Use a home irrigation kit only as told by your health care provider. Read the package instructions carefully. Follow the directions for using the syringe. Use water that is room temperature. Do not do ear irrigation at home if you:  Have diabetes. Diabetes increases the risk of infection.  Have a hole or tear in your eardrum.  Have tubes in your ears.  Have had any ear surgery in the past.  Have been instructed not to irrigate your ears. What are the risks of ear irrigation? Generally, this is a  safe procedure. However, problems may occur, including:  Infection.  Pain.  Hearing loss.  Pushing water and debris into the eardrum. This can occur if there are holes in the eardrum.  Ear irrigation failing to work. How should I care for my ears after irrigation? After an ear irrigation, follow instructions given to you by your healthcare provider. Cleaning   Clean the outside of your ear with a soft washcloth daily.  If told by your health care provider, use a few drops of baby oil, mineral oil, glycerin, hydrogen peroxide, or over-the-counter earwax softening drops.  Do not use cotton swabs to clean your ears. These can push wax down into the ear canal.  Do not put anything into your ears to try to remove wax. This includes ear candles. General instructions  Take over-the-counter and prescription medicines only as told by your health care provider.  If you were prescribed an antibiotic medicine, use it as told by your health care provider. Do not stop using the antibiotic even if your condition improves.  Keep all follow-up visits as told by your health care provider. This is important.  Visit your health care provider at least once a year to have your ears and hearing checked. Follow these instructions at home:  Keep the ear clean and dry by following the instructions from your healthcare provider. Contact a health care provider if:  Your hearing is not improving  or is getting worse.  You have pain or redness in your ear.  You are dizzy.  You have ringing in your ears.  You have nausea or vomiting.  You have fluid, blood, or pus coming out of your ear. Summary  Ear irrigation is a procedure to wash dirt and wax out of your ear canal. This procedure is also called lavage.  To perform ear irrigation, ear drops may be put in your ear 15-20 minutes before irrigation. Water or sterile salt solution (saline) will be used to flush out wax and other debris.  You may  be able to irrigate your ears at home. Ask your health care provider if this is an option for you. Follow your health care provider's instructions.  Clean your ears with a soft cloth after irrigation. Do not use cotton swabs to clean your ears. These can push wax down into the ear canal. This information is not intended to replace advice given to you by your health care provider. Make sure you discuss any questions you have with your health care provider. Document Released: 01/25/2015 Document Revised: 09/27/2017 Document Reviewed: 09/27/2017 Elsevier Patient Education  2020 [employer].

## 2018-07-13 NOTE — Progress Notes (Signed)
     Subjective: Chief Complaint  Patient presents with  . Sinusitis  . Ear Pain     HPI: Janet Hughes is a 38 y.o. presenting to clinic today to discuss the following:  Right Ear Pain Patient reports her right ear feels "stopped up" at times and she began having some discomfort earlier this week described as a burning sensation that "comes and goes" randomly. No hearing loss except when it feels stopped up. She is having associated sinus pressure, pain, sneezing, and water eyes, with intermittent headache.She has been taking OTC Claritin that is helping her symptoms. She has a history of seasonal allergies. For her ear pain she has tried wax candles, q-tips, but no liquid drops. I did counsel the patient that it is not recommended to use either q-tips on the inner ear or wax candles at all.    No fever, cough, shortness of breath, difficulty breathing, abdominal pain, nausea, vomiting, dysuria, diarrhea, or constipation.  ROS noted in HPI.   Past Medical, Surgical, Social, and Family History Reviewed & Updated per EMR.   Pertinent Historical Findings include:   Social History   Tobacco Use  Smoking Status Former Smoker  . Packs/day: 0.30  . Types: Cigarettes, E-cigarettes  . Quit date: 02/26/2012  . Years since quitting: 6.3  Smokeless Tobacco Never Used  Tobacco Comment   uses vapor cig   Objective: BP 112/62   Pulse 74   SpO2 99%  Vitals and nursing notes reviewed  Physical Exam Gen: Alert and Oriented x 3, NAD HEENT: Normocephalic, atraumatic, PERRLA, EOMI, Right TM occluded with large amount of cerumen impaction, Left TM visible with good light reflex, right ear canal has area of irritation and erythema, slightly swollen, mildly erythematous turbinates, non-erythematous pharyngeal mucosa, no exudates Neck: trachea midline, no thyroidmegaly, no LAD CV: RRR, no murmurs, normal S1, S2 split Resp: CTAB, no wheezing, rales, or rhonchi, comfortable work of breathing  Ext: no clubbing, cyanosis, or edema Skin: warm, dry, intact, no rashes  Assessment/Plan:  Ear pain, right Cerumen impaction which most was succesfully removed with irrigation and use of curette.  - Return as needed for continued feeling of ear fullness and/or ear pain - Advised she no longer use wax candles on her ears - Advised she could use liquid OTC ear drops like Debrox in the future to soften ear wax to make it more likely to self drain.   PATIENT EDUCATION PROVIDED: See AVS    Diagnosis and plan along with any newly prescribed medication(s) were discussed in detail with this patient today. The patient verbalized understanding and agreed with the plan. Patient advised if symptoms worsen return to clinic or ER.    No orders of the defined types were placed in this encounter.   Meds ordered this encounter  Medications  . mometasone (NASONEX) 50 MCG/ACT nasal spray    Sig: Place 2 sprays into the nose daily.    Dispense:  17 g    Refill:  [place], DO 07/13/2018, 9:44 AM PGY-2 [place]

## 2018-07-18 ENCOUNTER — Other Ambulatory Visit: Payer: Self-pay

## 2018-07-18 ENCOUNTER — Ambulatory Visit (INDEPENDENT_AMBULATORY_CARE_PROVIDER_SITE_OTHER): Payer: No Typology Code available for payment source | Admitting: Family Medicine

## 2018-07-18 ENCOUNTER — Encounter: Payer: Self-pay | Admitting: Family Medicine

## 2018-07-18 DIAGNOSIS — J302 Other seasonal allergic rhinitis: Secondary | ICD-10-CM | POA: Diagnosis not present

## 2018-07-18 DIAGNOSIS — H9201 Otalgia, right ear: Secondary | ICD-10-CM

## 2018-07-18 MED ORDER — FLUTICASONE PROPIONATE 50 MCG/ACT NA SUSP: 2.0000 | Freq: Every day | NASAL | 6 refills | Status: AC

## 2018-07-18 NOTE — Assessment & Plan Note (Signed)
Not currently experiencing pain, discharge, hearing loss.  Had a irrigation and manual earwax removal.  She noted some bleeding after this but not in the past few days.  Initially had feeling of 'fullness' in the area that she says improved.  On exam there was a large ulceration with surrounding dried blood on the posterior aspect of the right ear.  She did not experience any pain during exam.   - advised patient to come back in two weeks for recheck of ear.  Scheduled appt with me.  - advised pt not to come submerge in water or stick anything in her ear.

## 2018-07-18 NOTE — Assessment & Plan Note (Addendum)
Cerumen impaction which most was succesfully removed with irrigation and use of curette.  - Return as needed for continued feeling of ear fullness and/or ear pain - Advised she no longer use wax candles on her ears - Advised she could use liquid OTC ear drops like Debrox in the future to soften ear wax to make it more likely to self drain. - Nasonex to help with congestion most likely related to allergies

## 2018-07-18 NOTE — Assessment & Plan Note (Signed)
Patient prescribed Nasacort on last visit but stated this is too expensive.  Patient says naso-nex used to give her nosebleeds - Prescribed Flonase.  Advised patient to take either Flonase or Nasacort should she get her Nasacort prescription, but do not take both.

## 2018-07-18 NOTE — Progress Notes (Signed)
   [facility] Phone: [phone]   cc: Right ear pain  Subjective:    Right ear:  Came in last Thursday with complaints of stuffiness in that right ear. Her ear canal was irrigated and a currette was used to manually remove cerumen. She experienced pain during this removal.  She also feels like her ear was 'full' for the next few days, but this resolved.  She now has no pain, discharge, or hearing changes, but a friend of hers looked in her ear and felt like she saw something in there, which is why she is hear today.    Allergies: The patient has had allergies for several years, but this is the first time in a few years that they have bothered her.  She had a Rx for nasocort but it was too expensive at the pharmacy she was using.  She is experiencing rhinorrhea, stuffiness, but not currently experiencing sinus pain.    ROS: See HPI for pertinent positives and negatives  Past Medical History  Family history reviewed for today's visit. No changes.  Social history- patient is a non smoker  Objective: BP 110/62   Pulse 76   LMP 07/17/2018 (Exact Date)   SpO2 99%  Gen: NAD, alert and oriented, cooperative with exam HEENT: left ear canal normal, normal left TM.  Right TM normal, right ear canal has an ulceration on the posterior side with some dried blood surrounding.   CV: normal rate, regular rhythm. No murmurs, no rubs.  Resp: LCTAB, no wheezes, crackles. normal work of breathing Psych: Appropriate behavior  Assessment/Plan: Ear pain, right Not currently experiencing pain, discharge, hearing loss.  Had a irrigation and manual earwax removal.  She noted some bleeding after this but not in the past few days.  Initially had feeling of 'fullness' in the area that she says improved.  On exam there was a large ulceration with surrounding dried blood on the posterior aspect of the right ear.  She did not experience any pain during exam.   - advised patient to come  back in two weeks for recheck of ear.  Scheduled appt with me.  - advised pt not to come submerge in water or stick anything in her ear.    Seasonal allergies Patient prescribed Nasacort on last visit but stated this is too expensive.  Patient says naso-nex used to give her nosebleeds - Prescribed Flonase.  Advised patient to take either Flonase or Nasacort should she get her Nasacort prescription, but do not take both.    Clemetine Marker, MD PGY-1

## 2018-07-18 NOTE — Patient Instructions (Addendum)
It was nice to meet you today,   I would like to see you again in two weeks so I can look at your ear again to see if your ear is healing properly.  If you start to experience any symptoms in the meantime, such as ear pain, difficulty hearing, bleeding or other discharge, please schedule an appointment sooner.  Please do not put anything in your ear in the meantime.    I have also sent in a prescription for flonase. You can get this over the counter as well.  You can take this for your allergies instead of nasonex if it still too expensive.   Have a great day,   Clemetine Marker, MD

## 2018-08-01 ENCOUNTER — Ambulatory Visit (INDEPENDENT_AMBULATORY_CARE_PROVIDER_SITE_OTHER): Payer: No Typology Code available for payment source | Admitting: Family Medicine

## 2018-08-01 ENCOUNTER — Other Ambulatory Visit: Payer: Self-pay

## 2018-08-01 ENCOUNTER — Encounter: Payer: Self-pay | Admitting: Family Medicine

## 2018-08-01 VITALS — BP 130/92 | HR 75

## 2018-08-01 DIAGNOSIS — H9201 Otalgia, right ear: Secondary | ICD-10-CM

## 2018-08-01 NOTE — Assessment & Plan Note (Signed)
Patient returns after 2 weeks follow-up for right ear pain after a cerumen removal a week prior to that.  Patient no longer has symptoms, hearing loss.  Patient has not put anything in the ear canal during this time.  Examination showed normal tympanic membrane, with cerumen anteriorly and posteriorly there was a circular eschar it appeared to be depressed in the center and higher on the borders.  Not currently bleeding.  Patient was not tender on exam. - ENT referral for closer examination and second opinion

## 2018-08-01 NOTE — Patient Instructions (Signed)
It was great to see you again Janet Hughes.  It still does not look like your right ear canal is healing like I would expect it to, although it is great that you do not have any symptoms of discharge, bleeding, your pain or hearing loss.  I would still like you to see an ear nose and throat doctor so they can take a better look at it.  I have sent in a referral, someone will call you to schedule the time and date.  Have a great day  Clemetine Marker, MD

## 2018-08-01 NOTE — Progress Notes (Signed)
   [facility] Phone: [phone]   cc: Ear pain follow-up  Subjective:  Patient is here to follow-up from last visit 2 weeks ago regarding her right ear pain after a earwax cleaning and removal.  Since her last talk, the patient states she has had no issues with ear pain, hearing loss, discharge, bleeding.  She has not put anything in her ear at that time.  She denies nausea, vomiting, diarrhea, fever, chills, headache.  ROS: See HPI for pertinent positives and negatives  Past Medical History  Family history reviewed for today's visit. No changes.  Social history- patient is a non-smoker  Objective: BP (!) 130/92   Pulse 75   LMP 07/17/2018 (Exact Date)   SpO2 99%  Gen: NAD, alert and oriented, cooperative with exam HEENT: Left tympanic membrane and EAC normal.  Right tympanic membrane normal, amber cerumen anteriorly, with eschar posteriorly that appears to be heaped up on the borders.  Nontender on insertion of speculum Skin: No rashes, no lesions Psych: Appropriate behavior  Assessment/Plan: Ear pain, right Patient returns after 2 weeks follow-up for right ear pain after a cerumen removal a week prior to that.  Patient no longer has symptoms, hearing loss.  Patient has not put anything in the ear canal during this time.  Examination showed normal tympanic membrane, with cerumen anteriorly and posteriorly there was a circular eschar it appeared to be depressed in the center and higher on the borders.  Not currently bleeding.  Patient was not tender on exam. - ENT referral for closer examination and second opinion   Clemetine Marker, MD PGY-2

## 2018-09-08 ENCOUNTER — Other Ambulatory Visit: Payer: Self-pay

## 2018-09-10 MED ORDER — LISDEXAMFETAMINE DIMESYLATE 30 MG PO CAPS: 30.0000 mg | ORAL_CAPSULE | Freq: Every day | ORAL | 0 refills | Status: DC

## 2018-09-29 ENCOUNTER — Ambulatory Visit: Payer: No Typology Code available for payment source | Admitting: Family Medicine

## 2018-10-18 ENCOUNTER — Other Ambulatory Visit: Payer: Self-pay | Admitting: Family Medicine

## 2018-10-18 DIAGNOSIS — Z3041 Encounter for surveillance of contraceptive pills: Secondary | ICD-10-CM

## 2018-10-19 MED ORDER — NORGESTIMATE-ETH ESTRADIOL 0.25-35 MG-MCG PO TABS: 1.0000 | ORAL_TABLET | Freq: Every day | ORAL | 3 refills | Status: DC

## 2018-10-19 NOTE — Telephone Encounter (Signed)
Med was approved as set as Ward.  Resent to pharmacy. Christen Bame, CMA

## 2018-10-19 NOTE — Addendum Note (Signed)
Addended by: Christen Bame D on: 10/19/2018 01:42 PM   Modules accepted: Orders

## 2018-11-02 ENCOUNTER — Other Ambulatory Visit: Payer: Self-pay

## 2018-11-03 ENCOUNTER — Other Ambulatory Visit: Payer: Self-pay

## 2018-11-03 ENCOUNTER — Other Ambulatory Visit (HOSPITAL_COMMUNITY)
Admission: RE | Admit: 2018-11-03 | Discharge: 2018-11-03 | Disposition: A | Discharge status: Home / Self Care | Payer: Medicaid Other | Source: Ambulatory Visit | Attending: Family Medicine | Admitting: Family Medicine

## 2018-11-03 ENCOUNTER — Encounter: Payer: Self-pay | Admitting: Student in an Organized Health Care Education/Training Program

## 2018-11-03 ENCOUNTER — Ambulatory Visit (INDEPENDENT_AMBULATORY_CARE_PROVIDER_SITE_OTHER): Payer: Medicaid Other | Admitting: Student in an Organized Health Care Education/Training Program

## 2018-11-03 VITALS — BP 110/68 | HR 82 | Wt 155.2 lb

## 2018-11-03 DIAGNOSIS — Z23 Encounter for immunization: Secondary | ICD-10-CM | POA: Diagnosis not present

## 2018-11-03 DIAGNOSIS — R87610 Atypical squamous cells of undetermined significance on cytologic smear of cervix (ASC-US): Secondary | ICD-10-CM

## 2018-11-03 DIAGNOSIS — Z124 Encounter for screening for malignant neoplasm of cervix: Secondary | ICD-10-CM | POA: Insufficient documentation

## 2018-11-03 DIAGNOSIS — N898 Other specified noninflammatory disorders of vagina: Secondary | ICD-10-CM

## 2018-11-03 DIAGNOSIS — Z113 Encounter for screening for infections with a predominantly sexual mode of transmission: Secondary | ICD-10-CM | POA: Insufficient documentation

## 2018-11-03 LAB — POCT WET PREP (WET MOUNT)
Clue Cells Wet Prep Whiff POC: NEGATIVE
Trichomonas Wet Prep HPF POC: ABSENT

## 2018-11-03 NOTE — Patient Instructions (Signed)
It was a pleasure to see you today!  To summarize our discussion for this visit:  We are testing for STDs and other infections today.  We are also doing your cervical cancer screening today.  Some additional health maintenance measures we should update are: Health Maintenance Due  Topic Date Due  . PAP SMEAR-Modifier  05/24/2017  . INFLUENZA VACCINE  08/13/2018  .    Call the clinic at [phone] if your symptoms worsen or you have any concerns.   Thank you for allowing me to take part in your care,  Dr. Doristine Mango

## 2018-11-03 NOTE — Assessment & Plan Note (Signed)
Last Pap smear was negative for lesion or HPV. Performed Pap smear today.

## 2018-11-03 NOTE — Progress Notes (Signed)
   Subjective:    Patient ID: Janet Hughes, female    DOB: [birth year], 38 y.o.   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  Describes itchiness of her vaginal opening and vulvar which was slightly improved with 7-day topical treatment from drugstore but symptoms did not completely resolve and were not improved with a repeat of a 3-day topical treatment over-the-counter.  She states that the pruritus is constant but intermittent in severity.  Denies any rash, increased discharge, dysuria, odor. Last menstrual period was typical for her and ended 2 days ago lasting 4 days total. Patient is in monogamous relationship with 1 female and on birth control the use condoms intermittently.  Her last unprotected intercourse was September 26.  She has little concerns for STDs but would like to be screened anyway. She has no other concerns at this time.  Smoking status reviewed   ROS: pertinent noted in the HPI   I have personally reviewed pertinent past medical history, surgical, family, and social history as appropriate.  Objective:  BP 110/68   Pulse 82   Wt 155 lb 3.2 oz (70.4 kg)   LMP 10/24/2018 (Exact Date)   SpO2 97%   BMI 25.83 kg/m   Vitals and nursing note reviewed  General: NAD, pleasant, able to participate in exam Pelvic: External genitalia negative for lesions or rash.  Positive for rugae.  Vaginal canal positive for mucoid white discharge.  Cervix was technically difficult to examine with far anterior position.  No obvious lesions present.  Scant bleeding from Pap smear.  Patient denies tenderness with exam. Extremities: no edema or cyanosis. Skin: warm and dry, no rashes noted Neuro: alert, no obvious focal deficits Psych: Normal affect and mood  Assessment & Plan:    Vaginal itching Exam and wet prep negative for signs of infection.   Negative for symptoms of urinary tract infection. Recommended patient try lubricant. If no improvement in symptoms please return. Testing for STDs.  Will contact patient with results.  Hx of abnormal Pap smear (ASCUS, HPV+ 2010) Last Pap smear was negative for lesion or HPV. Performed Pap smear today.  Orders Placed This Encounter  Procedures  . Flu Vaccine QUAD 36+ mos IM  . HIV antibody (with reflex)  . RPR  . POCT Wet Prep [facility])    Doristine Mango, [facility] [place] [facility] PGY-2

## 2018-11-03 NOTE — Assessment & Plan Note (Addendum)
Exam and wet prep negative for signs of infection.  Negative for symptoms of urinary tract infection. Recommended patient try lubricant. If no improvement in symptoms please return. Testing for STDs.  Will contact patient with results.

## 2018-11-04 LAB — HIV ANTIBODY (ROUTINE TESTING W REFLEX): HIV Screen 4th Generation wRfx: NONREACTIVE

## 2018-11-04 LAB — RPR: RPR Ser Ql: NONREACTIVE

## 2018-11-08 LAB — CYTOLOGY - PAP
Adequacy: ABSENT
Comment: NEGATIVE
Diagnosis: NEGATIVE
High risk HPV: NEGATIVE

## 2018-11-08 LAB — CERVICOVAGINAL ANCILLARY ONLY
Chlamydia: NEGATIVE
Comment: NEGATIVE
Comment: NORMAL
Neisseria Gonorrhea: NEGATIVE

## 2019-01-24 ENCOUNTER — Other Ambulatory Visit: Payer: Self-pay | Admitting: Internal Medicine

## 2019-01-25 ENCOUNTER — Other Ambulatory Visit: Payer: Self-pay | Admitting: Internal Medicine

## 2019-01-25 MED ORDER — LABETALOL HCL 200 MG PO TABS: 400.0000 mg | ORAL_TABLET | Freq: Two times a day (BID) | ORAL | 0 refills | Status: DC

## 2019-01-29 NOTE — Progress Notes (Deleted)
No show

## 2019-01-30 ENCOUNTER — Ambulatory Visit: Payer: Medicaid Other | Admitting: Internal Medicine

## 2019-01-31 NOTE — Progress Notes (Signed)
Cardiology Office Note:    Date:  02/01/2019   ID:  [name], DOB 08-Dec-1980, MRN [id number]  PCP:  Leeroy Bock, DO  Cardiologist:  Dietrich Pates, MD / [name], PA-C  Electrophysiologist:  None   Referring MD: Leeroy Bock, DO   Chief Complaint  Patient presents with  . Follow-up    HTN    History of Present Illness:    Janet Hughes is a 39 y.o. female with:   Hypertension   GXT 9/18: no ST-TW changes to suggest ischemia  Echocardiogram 9/18: EF 55-60  Intol of Nifedipine due to swelling  Symptomatic PACs   Event monitor 9/18  Managed with non-selective beta-blocker Rx  ADD  Janet Hughes was last seen in clinic in 01/2018.    She returns for annual follow up.  She is doing well without chest pain, shortness of breath.  Her palpitations are infrequent on her current medications.  She has noted some thinning of her hair and wonders if it is related to Labetalol.  She did have COVID-19 in Oct 2020.  She lost her sense of taste and smell. It eventually returned and she is doing fine now.    Prior CV studies:   The following studies were reviewed today:  Event Monitor 09/24/16 Sinus rhythm with isolated PACs Symptoms did not always correlate with arrhythmia  GXT 09/24/16  Blood pressure demonstrated a normal response to exercise.  Pt wallked for 7:48 on A Bruce GXT . Peak HR if 169 which is 91 % predicted HR. There were no ST or T wave changes to suggest ischemia.  BP response was normal .  Negative GXT  Echo 09/15/16 EF 55-60, no RWMA, normal diastolic function, mild LAE, trivial TR, PASP 26   Past Medical History:  Diagnosis Date  . Abortion, therapeutic 12/01  . ADHD (attention deficit hyperactivity disorder) 08/07/2014  . Anxiety   . Breast lump in female 06/27/2015  . Chlamydia infection    repeated infections  . 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 Dysuria  12/20/2009   Qualifier: Diagnosis of  By: Humberto Seals NP, Darl Pikes    . Ectopic pregnancy 09/08/2014  . HEMORRHOIDS 11/15/2007   Qualifier: Diagnosis of  By: Humberto Seals NP, Darl Pikes    . History of stress test    GXT 9/18: normal   . Insomnia 01/03/2013  . Oral contraceptive prescribed 02/09/2014  . Palpitations 09/15/2016  . Post partum depression   . Rash and nonspecific skin eruption 07/22/2015  . Reactive cervical lymphadenopathy 01/20/2011  . SORE THROAT 02/10/2010   Qualifier: Diagnosis of  By: Humberto Seals NP, Darl Pikes    . Tobacco abuse 04/28/2011  . UNSPECIFIED VAGINITIS AND VULVOVAGINITIS 12/20/2009   Qualifier: Diagnosis of  By: Humberto Seals NP, Darl Pikes    . Vaginal discharge 11/11/2014  . Vaginitis 07/27/2011  . Yeast infection 07/19/2012   Surgical Hx: The patient  has a past surgical history that includes Colposcopy; Cesarean section; Breast enhancement surgery (Bilateral, 04/26/2012); and laparotomy (N/A, 09/08/2014).   Current Medications: Current Meds  Medication Sig  . ALPRAZolam (XANAX) 0.5 MG tablet Take 1 tablet (0.5 mg total) by mouth at bedtime as needed for sleep.  . CHLOROPHYLL PO Take 0.5 oz by mouth daily.  . fluticasone (FLONASE) 50 MCG/ACT nasal spray Place 2 sprays into both nostrils daily.  Marland Kitchen labetalol (NORMODYNE) 200 MG tablet Take 1 tablet (200 mg total) by mouth  2 (two) times daily.  Marland Kitchen lisdexamfetamine (VYVANSE) 30 MG capsule Take 1 capsule (30 mg total) by mouth daily.  Marland Kitchen MELATONIN GUMMIES PO Take 5 mg by mouth daily.   . mometasone (NASONEX) 50 MCG/ACT nasal spray Place 2 sprays into the nose daily.  . norgestimate-ethinyl estradiol (ESTARYLLA) 0.25-35 MG-MCG tablet Take 1 tablet by mouth daily.  . [DISCONTINUED] labetalol (NORMODYNE) 200 MG tablet Take 200 mg by mouth 2 (two) times daily.     Allergies:   Patient has no known allergies.   Social History   Tobacco Use  . Smoking status: Former Smoker    Packs/day: 0.30    Types: Cigarettes, E-cigarettes    Quit date: 02/26/2012    Years  since quitting: 6.9  . Smokeless tobacco: Never Used  . Tobacco comment: uses vapor cig  Substance Use Topics  . Alcohol use: No    Alcohol/week: 0.0 standard drinks  . Drug use: No     Family Hx: The patient's family history includes Anxiety disorder in her father; COPD in her maternal grandfather; Cancer in her maternal aunt; Depression in her mother; Heart failure in her maternal grandmother; Hypertension in her father and maternal grandmother; Post-traumatic stress disorder in her father. There is no history of Sudden Cardiac Death.  ROS:   Please see the history of present illness.    ROS All other systems reviewed and are negative.   EKGs/Labs/Other Test Reviewed:    EKG:  EKG is  ordered today.  The ekg ordered today demonstrates normal sinus rhythm, heart rate 71, normal axis, no ST-T wave changes, QTC 423, no change from prior tracing  Recent Labs: No results found for requested labs within last 8760 hours.   Recent Lipid Panel Lab Results  Component Value Date/Time   CHOL 181 06/27/2015 04:37 PM   TRIG 38 06/27/2015 04:37 PM   HDL 84 06/27/2015 04:37 PM   CHOLHDL 2.2 06/27/2015 04:37 PM   LDLCALC 89 06/27/2015 04:37 PM    Physical Exam:    VS:  BP 112/68   Pulse 71   Ht 5\' 5"  (1.651 m)   Wt 157 lb 1.9 oz (71.3 kg)   SpO2 99%   BMI 26.15 kg/m     Wt Readings from Last 3 Encounters:  02/01/19 157 lb 1.9 oz (71.3 kg)  11/03/18 155 lb 3.2 oz (70.4 kg)  04/15/18 145 lb (65.8 kg)     Physical Exam  Constitutional: She is oriented to person, place, and time. She appears well-developed and well-nourished. No distress.  HENT:  Head: Normocephalic and atraumatic.  Neck: No JVD present.  Cardiovascular: Normal rate, regular rhythm, S1 normal, S2 normal and normal heart sounds.  No murmur heard. Pulmonary/Chest: Effort normal. She has no rales.  Abdominal: Soft. There is no hepatomegaly.  Musculoskeletal:        General: No edema.     Cervical back: Neck  supple.  Neurological: She is alert and oriented to person, place, and time.  Skin: Skin is warm and dry.    ASSESSMENT & PLAN:    1. Essential hypertension Blood pressure is well controlled.  However, she has had some thinning of her hair.  We discussed alternative treatments.  She has remained interested in becoming pregnant some day.  Therefore, in the past, we used labetalol and nifedipine given their proposed safety.  We discussed switching to a medication like diltiazem or verapamil.  Although, the data regarding their safety in pregnancy is not as  robust as it is with labetalol and nifedipine.  She would like to try to cut back on the dose to see if this helps.  Therefore, I have advised her to decrease labetalol to 200 mg twice daily.  She will notify me if her pressure starts to run high.  Follow-up in 1 year.  2. Alopecia Reduce dose of beta-blocker as noted.  Obtain TSH today.      Dispo:  Return in about 1 year (around 02/01/2020) for Routine Follow Up, w/ Dr. Tenny Craw, or [name], PA-C, in person.   Medication Adjustments/Labs and Tests Ordered: Current medicines are reviewed at length with the patient today.  Concerns regarding medicines are outlined above.  Tests Ordered: Orders Placed This Encounter  Procedures  . TSH  . EKG 12-Lead   Medication Changes: Meds ordered this encounter  Medications  . labetalol (NORMODYNE) 200 MG tablet    Sig: Take 1 tablet (200 mg total) by mouth 2 (two) times daily.    Dispense:  180 tablet    Refill:  3    Signed, [name], PA-C  02/01/2019 10:05 AM    [facility] [address], [place], [place]  [address] Phone: [phone]; Fax: [phone]

## 2019-02-01 ENCOUNTER — Other Ambulatory Visit: Payer: Self-pay

## 2019-02-01 ENCOUNTER — Ambulatory Visit: Payer: Medicaid Other | Admitting: Physician Assistant

## 2019-02-01 ENCOUNTER — Encounter: Payer: Self-pay | Admitting: Physician Assistant

## 2019-02-01 VITALS — BP 112/68 | HR 71 | Ht 65.0 in | Wt 157.1 lb

## 2019-02-01 DIAGNOSIS — I10 Essential (primary) hypertension: Secondary | ICD-10-CM | POA: Diagnosis not present

## 2019-02-01 DIAGNOSIS — L659 Nonscarring hair loss, unspecified: Secondary | ICD-10-CM

## 2019-02-01 LAB — TSH: TSH: 1.81 u[IU]/mL (ref 0.450–4.500)

## 2019-02-01 MED ORDER — LABETALOL HCL 200 MG PO TABS: 200.0000 mg | ORAL_TABLET | Freq: Two times a day (BID) | ORAL | 3 refills | Status: DC

## 2019-02-01 NOTE — Patient Instructions (Signed)
Medication Instructions:   Your physician has recommended you make the following change in your medication:   1) Decrease your Labetalol to 200mg  1 tablet, by mouth twice a day  *If you need a refill on your cardiac medications before your next appointment, please call your pharmacy*  Lab Work:  You will have labs drawn today: TSH  If you have labs (blood work) drawn today and your tests are completely normal, you will receive your results only by: MyChart Message (if you have MyChart) OR . A paper copy in the mail If you have any lab test that is abnormal or we need to change your treatment, we will call you to review the results.  Testing/Procedures:  None ordered today  Follow-Up: At [facility], you and your health needs are our priority.  As part of our continuing mission to provide you with exceptional heart care, we have created designated Provider Care Teams.  These Care Teams include your primary Cardiologist (physician) and Advanced Practice Providers (APPs -  Physician Assistants and Nurse Practitioners) who all work together to provide you with the care you need, when you need it.  Your next appointment:   12 month(s)  The format for your next appointment:   In Person  Provider:   You may see [facility], MD or one of the following Advanced Practice Providers on your designated Care Team:    Dietrich Pates, PA-C  Vin [place], [place]  [place], [place]on Bon

## 2019-02-12 ENCOUNTER — Other Ambulatory Visit: Payer: Self-pay | Admitting: Student in an Organized Health Care Education/Training Program

## 2019-02-12 DIAGNOSIS — Z3041 Encounter for surveillance of contraceptive pills: Secondary | ICD-10-CM

## 2019-02-13 ENCOUNTER — Other Ambulatory Visit: Payer: Self-pay | Admitting: Student in an Organized Health Care Education/Training Program

## 2019-02-13 DIAGNOSIS — Z3041 Encounter for surveillance of contraceptive pills: Secondary | ICD-10-CM

## 2019-02-13 MED ORDER — NORGESTIMATE-ETH ESTRADIOL 0.25-35 MG-MCG PO TABS: 1.0000 | ORAL_TABLET | Freq: Every day | ORAL | 3 refills | Status: DC

## 2019-02-13 NOTE — Progress Notes (Signed)
Refilled OCP

## 2019-02-21 ENCOUNTER — Other Ambulatory Visit: Payer: Self-pay

## 2019-02-22 MED ORDER — LISDEXAMFETAMINE DIMESYLATE 30 MG PO CAPS: 30.0000 mg | ORAL_CAPSULE | Freq: Every day | ORAL | 0 refills | Status: DC

## 2019-03-28 ENCOUNTER — Telehealth: Payer: Self-pay | Admitting: Internal Medicine

## 2019-03-28 NOTE — Telephone Encounter (Signed)
Called patient back about her message. Patient stated that labetalol has been causing her to have hair loss and hair to thin. Patient stated she discussed this with [name] PA at her last visit and he told her to call if she wanted to try something else. Patient stated she would not mind trying spirolactone. Informed patient that these are two different types of medications (beta blocker and diuretic). Will send message to [name] PA for advisement.

## 2019-03-28 NOTE — Telephone Encounter (Signed)
New message:    Patient calling concerning some medication side effects with loosing hair. Patient would like to know if she could take something else, also the patient would like for some one to call back.

## 2019-03-29 ENCOUNTER — Other Ambulatory Visit: Payer: Self-pay

## 2019-03-29 NOTE — Telephone Encounter (Signed)
Discussed with pharmacist.  Diltiazem should be ok to use.  If she becomes pregnant in the future, we would need to change to something else if she decides to breast feed. PLAN:  DC Labetalol Start Diltiazem CD 120 mg once daily  Monitor BP for 2 weeks after change and send readings for review. [name], PA-C    03/29/2019 3:30 PM

## 2019-03-29 NOTE — Telephone Encounter (Signed)
I called and left patient a message to call back. 

## 2019-03-29 NOTE — Telephone Encounter (Signed)
We have used Nifedipine and Labetalol for hypertension in the past due to her desire to possibly become pregnant in the future.  These drugs are generally felt to be safe in pregnancy.  She had an intolerance to Nifedipine.  Her symptoms of palpitations have been controlled on beta-blocker therapy.  Therefore, switching to a different class that can also help alleviate palpitations would be ideal.  The medications that could be used are Diltiazem and Verapamil.  I will need to review these with our pharmacist to see what the potential harms are should she become pregnant on these drugs.  [name], PA-C    03/29/2019 9:33 AM

## 2019-03-31 ENCOUNTER — Telehealth: Payer: Self-pay | Admitting: Internal Medicine

## 2019-03-31 MED ORDER — DILTIAZEM HCL ER COATED BEADS 120 MG PO CP24: 120.0000 mg | ORAL_CAPSULE | Freq: Every day | ORAL | 2 refills | Status: DC

## 2019-03-31 NOTE — Telephone Encounter (Signed)
I called and spoke with patient, she is aware to stop labetalol and start Diltiazem CD, prescription sent to pharmacy. Patient aware to monitor blood pressure for 2 weeks and send readings. Patient verbalized understanding and thanked me for the call.

## 2019-03-31 NOTE — Telephone Encounter (Signed)
   Pt c/o medication issue:  1. Name of Medication: Cardizem  2. How are you currently taking this medication (dosage and times per day)?   3. Are you having a reaction (difficulty breathing--STAT)?   4. What is your medication issue? Pt was looking for Irving Burton, she said that [name] prescribed her Cardizem and would like to know if it safe for her to take that.  Please call

## 2019-03-31 NOTE — Telephone Encounter (Signed)
Follow up ° ° °Patient is returning your call. Please call. ° ° ° °

## 2019-03-31 NOTE — Telephone Encounter (Signed)
I called and spoke with patient, she is aware that Cardizem is ok to take per [facility].

## 2019-04-19 ENCOUNTER — Ambulatory Visit: Payer: Medicaid Other | Admitting: Family Medicine

## 2019-04-19 ENCOUNTER — Other Ambulatory Visit (HOSPITAL_COMMUNITY)
Admission: RE | Admit: 2019-04-19 | Discharge: 2019-04-19 | Disposition: A | Discharge status: Home / Self Care | Payer: Medicaid Other | Source: Ambulatory Visit | Attending: Family Medicine | Admitting: Family Medicine

## 2019-04-19 ENCOUNTER — Other Ambulatory Visit: Payer: Self-pay

## 2019-04-19 VITALS — BP 118/80 | HR 82 | Ht 64.0 in | Wt 155.4 lb

## 2019-04-19 DIAGNOSIS — N898 Other specified noninflammatory disorders of vagina: Secondary | ICD-10-CM | POA: Diagnosis not present

## 2019-04-19 DIAGNOSIS — Z113 Encounter for screening for infections with a predominantly sexual mode of transmission: Secondary | ICD-10-CM | POA: Insufficient documentation

## 2019-04-19 LAB — POCT URINE PREGNANCY: Preg Test, Ur: NEGATIVE

## 2019-04-19 LAB — POCT WET PREP (WET MOUNT)
Clue Cells Wet Prep Whiff POC: NEGATIVE
Trichomonas Wet Prep HPF POC: ABSENT

## 2019-04-19 NOTE — Patient Instructions (Signed)
It was great seeing you today!  Your wet prep was negative.  We did perform some additional testing for other infectious causes of the odor.  This should come back probably tomorrow I will give you call with results.  In the meantime I think the odor and the cyst could be due to hormonal changes secondary to dropping the birth control.  The cyst should go away on its own over time, but occasionally these things will linger and even sometimes get bigger.  If you feel these things happening please let us know come back and see [place] for potential drainage.

## 2019-04-20 ENCOUNTER — Encounter: Payer: Self-pay | Admitting: Family Medicine

## 2019-04-20 DIAGNOSIS — Z113 Encounter for screening for infections with a predominantly sexual mode of transmission: Secondary | ICD-10-CM | POA: Insufficient documentation

## 2019-04-20 DIAGNOSIS — N898 Other specified noninflammatory disorders of vagina: Secondary | ICD-10-CM | POA: Insufficient documentation

## 2019-04-20 LAB — RPR: RPR Ser Ql: NONREACTIVE

## 2019-04-20 LAB — CERVICOVAGINAL ANCILLARY ONLY
Chlamydia: NEGATIVE
Comment: NEGATIVE
Comment: NORMAL
Neisseria Gonorrhea: NEGATIVE

## 2019-04-20 LAB — HIV ANTIBODY (ROUTINE TESTING W REFLEX): HIV Screen 4th Generation wRfx: NONREACTIVE

## 2019-04-20 NOTE — Progress Notes (Signed)
   CHIEF COMPLAINT / HPI: 39 year old female who presents for vaginal odor.  Patient believes that she might have bacterial vaginosis.  She recently stopped her birth control and wonders if the odor may be due to hormonal changes from that.  She has no STD exposure as far she knows.  Patient also noted that her sexual partner found a "knot" in the right part of her vagina.  It is not close to the opening and is further back.  PERTINENT  PMH / PSH:    OBJECTIVE: BP 118/80   Pulse 82   Ht 5\' 4"  (1.626 m)   Wt 155 lb 6.4 oz (70.5 kg)   LMP 04/13/2019 (Exact Date)   SpO2 99%   BMI 26.67 kg/m   Gen: 39 year old female, no acute distress, resting comfortably CV: Skin warm and dry Resp: No sensory muscle use, no respiratory distress Neuro: Alert and oriented, Speech clear, No gross deficits  GU: No discharge noted.  No cervical abnormality, it was well visualized.  On digital exam patient does have nodular, well-circumscribed structure in the deep, right vaginal wall.  ASSESSMENT / PLAN:  Screening examination for STD (sexually transmitted disease) Wet prep negative for abnormality.  GC/chlamydia testing negative.  RPR/hiv negative.  Vaginal cyst Well-circumscribed, nodular feeling area located in the deep right vaginal wall.  Likely to be a vaginal wall cyst.  Explained that this will likely resolve on their own without any intervention.  Occasionally can become uncomfortable or big enough to warrant intervention.  Can continue to monitor, follow-up as necessary.     20 MD PGY-3 Family Medicine Resident [facility]

## 2019-04-20 NOTE — Assessment & Plan Note (Signed)
Well-circumscribed, nodular feeling area located in the deep right vaginal wall.  Likely to be a vaginal wall cyst.  Explained that this will likely resolve on their own without any intervention.  Occasionally can become uncomfortable or big enough to warrant intervention.  Can continue to monitor, follow-up as necessary.

## 2019-04-20 NOTE — Assessment & Plan Note (Signed)
Wet prep negative for abnormality.  GC/chlamydia testing negative.  RPR/hiv negative.

## 2019-05-02 ENCOUNTER — Ambulatory Visit: Payer: Medicaid Other | Admitting: Student in an Organized Health Care Education/Training Program

## 2019-05-18 ENCOUNTER — Other Ambulatory Visit: Payer: Self-pay

## 2019-05-18 MED ORDER — DILTIAZEM HCL ER COATED BEADS 120 MG PO CP24: 120.0000 mg | ORAL_CAPSULE | Freq: Every day | ORAL | 2 refills | Status: DC

## 2019-05-18 NOTE — Telephone Encounter (Signed)
Pt's medication was sent to pt's pharmacy as requested. Confirmation received.  °

## 2019-07-12 ENCOUNTER — Telehealth: Payer: Self-pay | Admitting: Physician Assistant

## 2019-07-12 NOTE — Telephone Encounter (Signed)
Patient is currently trying to getting pregnant and her OB wants her to get back on labetalol or try procardia. Please advise.

## 2019-07-14 NOTE — Telephone Encounter (Signed)
Patient on Toprol XL for HTN and PACs  Low dose  Would switch to labetalol 100 bid  Follow BP and HR     If BP low could go to 1/2 of this bid

## 2019-07-21 NOTE — Telephone Encounter (Signed)
Stop cardiazem Use labetalol   Can increase dose as needed for BP    Call back in a few wks with how doing

## 2019-07-21 NOTE — Telephone Encounter (Signed)
Patient has Cardizem CD on her med list but not Toprol.  I spoke with patient to confirm.  She is taking Cardizem CD.  Took Toprol in the past but is not currently taking.   Will forward to Dr Tenny Craw to see if patient should stop Cardizem CD and start labetalol as noted below. Patient reports she can come in if office visit needed.

## 2019-07-21 NOTE — Telephone Encounter (Signed)
WOuld start labetalol   Follow BP   use mychart to write in  bp response in a couple weeks

## 2019-07-21 NOTE — Telephone Encounter (Signed)
Will forward to Dr Tenny Craw to see if patient should stop Cardizem when starting labetalol

## 2019-07-24 ENCOUNTER — Other Ambulatory Visit: Payer: Self-pay

## 2019-07-24 ENCOUNTER — Telehealth: Payer: Self-pay | Admitting: Internal Medicine

## 2019-07-24 ENCOUNTER — Ambulatory Visit: Payer: Medicaid Other | Admitting: Family Medicine

## 2019-07-24 VITALS — BP 118/68 | HR 74 | Wt 155.4 lb

## 2019-07-24 DIAGNOSIS — R300 Dysuria: Secondary | ICD-10-CM | POA: Diagnosis present

## 2019-07-24 LAB — POCT URINALYSIS DIP (MANUAL ENTRY)
Bilirubin, UA: NEGATIVE
Blood, UA: NEGATIVE
Glucose, UA: NEGATIVE mg/dL
Ketones, POC UA: NEGATIVE mg/dL
Leukocytes, UA: NEGATIVE
Nitrite, UA: NEGATIVE
Protein Ur, POC: NEGATIVE mg/dL
Spec Grav, UA: 1.02 (ref 1.010–1.025)
Urobilinogen, UA: 0.2 E.U./dL
pH, UA: 7 (ref 5.0–8.0)

## 2019-07-24 LAB — POCT URINE PREGNANCY: Preg Test, Ur: NEGATIVE

## 2019-07-24 MED ORDER — LABETALOL HCL 100 MG PO TABS: 100.0000 mg | ORAL_TABLET | Freq: Two times a day (BID) | ORAL | Status: DC

## 2019-07-24 NOTE — Telephone Encounter (Signed)
Patient will stop Cardizem CD 120 daily and start labetalol 100 mg twice daily. She has ample supply of labetalol at home.  She reports was taking 400 mg twice a day previously. States BP has been good and Cardizem was good at suppressing palpitations.  Adv her to MyChart her BP/HR in about 10 days or so. Scheduled a follow up in Nov w Dr. Tenny Craw.  Adv pt if she is doing well we can push this out to Jan per the recall.

## 2019-07-24 NOTE — Assessment & Plan Note (Signed)
UA negative today. Urine pregnancy test also negative. Discussed with patient that we will go ahead and obtain urine culture given that she did have symptoms, but they are now resolved. 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 Discussed with her ways to avoid recurrent UTIs, although there is lack of evidence, could be beneficial. This includes urinating after intercourse, washing after intercourse, and using women's probiotic. Will follow culture, but for now would not treat given that she is asymptomatic and UA is negative.

## 2019-07-24 NOTE — Telephone Encounter (Signed)
Clarifying - in the past -patient has taken as much as 400 mg bid of labetalol.  It was reduced by Wende Mott, PA-C in January 2021 due to possible cause for her thinning hair.     The patient wanted to make this dose info known since we are starting her back on labetalol.  She will monitor BP and write in w readings.

## 2019-07-24 NOTE — Telephone Encounter (Signed)
Med list in chart says 100 bid   How do I keep up?   This needs to be modified Pt needs to keep tabs on BP

## 2019-07-24 NOTE — Telephone Encounter (Signed)
Left message for patient to call back  

## 2019-07-24 NOTE — Progress Notes (Signed)
    SUBJECTIVE:   CHIEF COMPLAINT / HPI:   DYSURIA Pain urinating started 6 days ago. Lasted until today.  Was trying to drink a lot of water. Pain is: right when bladder is done emptying.  Felt like spasms. Medications tried: had one pill of an old antibiotic (Macrobid) from last month for UTI Any antibiotics in the last 30 days: no, over 30 days ago was on Macrobid.  Got better after using antibiotic.  No fever with that UTI. More than 3 UTIs in the last 12 months: yes, gets them frequently, probably about 4.  Reports Klebsiella with last UTI. STD exposure: no Possibly pregnant: yes, took home one that was negative, she is trying to get pregnant  Symptoms Urgency: yes Frequency: yes Blood in urine: no Pain in back: right back to the right rib Fever: no Vaginal discharge: no Mouth Ulcers: no  No history of kidney stones  PERTINENT  PMH / PSH: HTN, Anxiety  OBJECTIVE:   BP 118/68   Pulse 74   Wt 155 lb 6.4 oz (70.5 kg)   LMP 06/28/2019   SpO2 100%   BMI 26.67 kg/m    Physical Exam:  General: 39 y.o. female in NAD Lungs: Breathing comfortably on room air Abdomen: Soft, mild suprapubic tenderness, otherwise no tenderness, non-distended, mild tenderness to palpation of left CVA Skin: warm and dry Extremities: No edema, ambulating without difficulty  Results for orders placed or performed in visit on 07/24/19 (from the past 24 hour(s))  POCT urinalysis dipstick     Status: Abnormal   Collection Time: 07/24/19  3:20 PM  Result Value Ref Range   Color, UA yellow yellow   Clarity, UA cloudy (A) clear   Glucose, UA negative negative mg/dL   Bilirubin, UA negative negative   Ketones, POC UA negative negative mg/dL   Spec Grav, UA 5.956 3.875 - 1.025   Blood, UA negative negative   pH, UA 7.0 5.0 - 8.0   Protein Ur, POC negative negative mg/dL   Urobilinogen, UA 0.2 0.2 or 1.0 E.U./dL   Nitrite, UA Negative Negative   Leukocytes, UA Negative Negative  POCT urine  pregnancy     Status: None   Collection Time: 07/24/19  3:20 PM  Result Value Ref Range   Preg Test, Ur Negative Negative      ASSESSMENT/PLAN:   Dysuria UA negative today. Urine pregnancy test also negative. Discussed with patient that we will go ahead and obtain urine culture given that she did have symptoms, but they are now resolved. 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 Discussed with her ways to avoid recurrent UTIs, although there is lack of evidence, could be beneficial. This includes urinating after intercourse, washing after intercourse, and using women's probiotic. Will follow culture, but for now would not treat given that she is asymptomatic and UA is negative.     Unknown Jim, DO [facility]

## 2019-07-24 NOTE — Telephone Encounter (Signed)
Patient called to return Michalene's call. Michalene requested to tell patient she will call her back within 5-10 mins, patient advised.

## 2019-07-24 NOTE — Patient Instructions (Signed)
Thank you for coming to see me today. It was a pleasure. Today we talked about:   You do not have a urinary tract infection you are not pregnant.  If your symptoms come back, please come back.  We will go ahead and order culture and see if it grows anything.  If it does, we will give you a call and treated as needed.  If you have any questions or concerns, please do not hesitate to call the office at [phone].  Best,   Luis Abed, DO

## 2019-07-26 ENCOUNTER — Other Ambulatory Visit: Payer: Self-pay | Admitting: Family Medicine

## 2019-07-26 DIAGNOSIS — N3000 Acute cystitis without hematuria: Secondary | ICD-10-CM

## 2019-07-26 LAB — URINE CULTURE

## 2019-07-26 MED ORDER — NITROFURANTOIN MONOHYD MACRO 100 MG PO CAPS: 100.0000 mg | ORAL_CAPSULE | Freq: Two times a day (BID) | ORAL | 0 refills | Status: AC

## 2019-07-26 NOTE — Progress Notes (Signed)
Rx sent, urine culture grew greater than 100,000 colonies of staph saprophyticus.  Keflex will likely not cover this bug.  Macrobid sent in.  Patient asked if she became pregnant if this would be safe, according to up-to-date, use is only contraindicated in pregnant patients at term, during labor and delivery, or when onset of labor is imminent due to possibility of hemolytic anemia in the newborn.  Mother was reassured by this.  She is asking because she is currently trying to become pregnant.

## 2019-08-18 NOTE — Telephone Encounter (Signed)
BP readings look good   Keep on same meds

## 2019-11-17 ENCOUNTER — Ambulatory Visit: Payer: Medicaid Other | Admitting: Internal Medicine

## 2019-12-18 ENCOUNTER — Other Ambulatory Visit: Payer: Self-pay

## 2019-12-18 ENCOUNTER — Encounter: Payer: Self-pay | Admitting: Family Medicine

## 2019-12-18 ENCOUNTER — Ambulatory Visit (INDEPENDENT_AMBULATORY_CARE_PROVIDER_SITE_OTHER): Payer: Medicaid Other | Admitting: Family Medicine

## 2019-12-18 DIAGNOSIS — B070 Plantar wart: Secondary | ICD-10-CM

## 2019-12-18 NOTE — Progress Notes (Signed)
    SUBJECTIVE:   CHIEF COMPLAINT / HPI:   Patient has a self diagnosed plantar wart on her right foot.  Wants it treated    OBJECTIVE:   BP 104/72   Pulse 84   Ht 5\' 4"  (1.626 m)   Wt 163 lb 9.6 oz (74.2 kg)   LMP 11/28/2019   SpO2 99%   BMI 28.08 kg/m   Plantar wart on arch of right foot with surrounding callous and clearly demarkated central wart.  Callous and wart shaved.  Frozen with liquid nitrogen.  Patient tolerated procedure well.  After care instructions given  ASSESSMENT/PLAN:   Plantar wart Frozen.  Return if needs retreated.     11/30/2019, MD [facility]

## 2019-12-18 NOTE — Assessment & Plan Note (Signed)
Frozen.  Return if needs retreated.

## 2019-12-18 NOTE — Patient Instructions (Signed)
Your diagnosis was correct.  You have a plantar wart.  I hope my freezing will get rid of it.

## 2020-01-31 NOTE — Progress Notes (Unsigned)
Cardiology Office Note   Date:  02/01/2020   ID:  Janet Hughes, DOB 1980/04/26, MRN [id number]  PCP:  Leeroy Bock, DO  Cardiologist:  Dr. Tenny Craw    F/U for HTN      History of Present Illness: Janet Hughes is a 40 y.o. female who presents for HTN and PACs. (holter monitor)  She had a stress test in 2018 that was normal  Echo LVEF 55 to 60^    I saw her in clinic in 2019   Since then she was last seen by Wende Mott in Jan 2021  At that time the labetalol was decreased to 100 bid  Since seen she says she feels good   Breathing is OK   No dizziness   BP at home 120s/ 80s usually    Does think hair is less thin    Still going through fertility Rx   Spouse has yet to be evaluated   Past Medical History:  Diagnosis Date  . Abortion, therapeutic 12/01  . ADHD (attention deficit hyperactivity disorder) 08/07/2014  . Anxiety   . Breast lump in female 06/27/2015  . Chlamydia infection    repeated infections  . 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 Dysuria 12/20/2009   Qualifier: Diagnosis of  By: Humberto Seals NP, Darl Pikes    . Ectopic pregnancy 09/08/2014  . HEMORRHOIDS 11/15/2007   Qualifier: Diagnosis of  By: Humberto Seals NP, Darl Pikes    . History of stress test    GXT 9/18: normal   . Insomnia 01/03/2013  . Oral contraceptive prescribed 02/09/2014  . Palpitations 09/15/2016  . Post partum depression   . Rash and nonspecific skin eruption 07/22/2015  . Reactive cervical lymphadenopathy 01/20/2011  . SORE THROAT 02/10/2010   Qualifier: Diagnosis of  By: Humberto Seals NP, Darl Pikes    . Tobacco abuse 04/28/2011  . UNSPECIFIED VAGINITIS AND VULVOVAGINITIS 12/20/2009   Qualifier: Diagnosis of  By: Humberto Seals NP, Darl Pikes    . Vaginal discharge 11/11/2014  . Vaginitis 07/27/2011  . Yeast infection 07/19/2012    Past Surgical History:  Procedure Laterality Date  . BREAST ENHANCEMENT SURGERY Bilateral 04/26/2012  . CESAREAN SECTION     2006  . COLPOSCOPY      no bx, pregnant  . LAPAROTOMY N/A 09/08/2014   Procedure: EXPLORATORY LAPAROTOMY WITH RIGHT SALPINGECTOMY;  Surgeon: Tilda Burrow, MD;  Location: [facility];  Service: Gynecology;  Laterality: N/A;  Ruptured Right Ectopic Pregnancy     Current Outpatient Medications  Medication Sig Dispense Refill  . ALPRAZolam (XANAX) 0.5 MG tablet Take 1 tablet (0.5 mg total) by mouth at bedtime as needed for sleep. 30 tablet 2  . CHLOROPHYLL PO Take 0.5 oz by mouth daily.    . Coenzyme Q10 (COQ10) 100 MG CAPS Take by mouth daily.    . fluticasone (FLONASE) 50 MCG/ACT nasal spray Place 2 sprays into both nostrils daily. 16 g 6  . labetalol (NORMODYNE) 100 MG tablet Take 1 tablet (100 mg total) by mouth 2 (two) times daily.    Marland Kitchen MELATONIN GUMMIES PO Take 5 mg by mouth daily.     . Omega-3 Fatty Acids (SALMON OIL PO) Take by mouth daily.    . Prenatal Vit-Fe Fumarate-FA (PRENATAL PO) Take by mouth daily.    . Probiotic Product (PROBIOTIC PO) Take by mouth daily.    . Selenium (SELENIMIN PO) Take by mouth daily.    Marland Kitchen  lisdexamfetamine (VYVANSE) 30 MG capsule Take 1 capsule (30 mg total) by mouth daily. 30 capsule 0   No current facility-administered medications for this visit.    Allergies:   Patient has no known allergies.    Social History:  The patient  reports that she quit smoking about 7 years ago. Her smoking use included cigarettes and e-cigarettes. She smoked 0.30 packs per day. She has never used smokeless tobacco. She reports that she does not drink alcohol and does not use drugs.   Family History:  The patient's family history includes Anxiety disorder in her father; COPD in her maternal grandfather; Cancer in her maternal aunt; Depression in her mother; Heart failure in her maternal grandmother; Hypertension in her father and maternal grandmother; Post-traumatic stress disorder in her father.    ROS:   Skin:no rashes or ulcers HEENT:no blurred vision, no congestion CV:see HPI PUL:see  HPI GI:no diarrhea constipation or melena, no indigestion GU:no hematuria, no dysuria MS:no joint pain, no claudication Neuro:no syncope, no lightheadedness Endo:no diabetes, no thyroid disease  Wt Readings from Last 3 Encounters:  02/01/20 162 lb 6.4 oz (73.7 kg)  12/18/19 163 lb 9.6 oz (74.2 kg)  07/24/19 155 lb 6.4 oz (70.5 kg)     PHYSICAL EXAM: VS:  BP 128/88   Pulse 77   Ht 5\' 4"  (1.626 m)   Wt 162 lb 6.4 oz (73.7 kg)   SpO2 99%   BMI 27.88 kg/m  , BMI Body mass index is 27.88 kg/m. General:Pleasant  Skin:Warm and dry, HEENT:normocephalic, sclera clear, mucus membranes moist Neck:supple, JVP is normal   Heart:S1S2 RRR without murmur Lungs:clear without rales, rhonchi, or wheezes , non tender, + BS, do not palpate liver spleen or masses Ext:no lower ext edema, 2+ pedal pulses Neuro:alert and oriented X 3, MAE,    EKG:  EKG is ordered today.  Ectopic    Recent Labs: 02/01/2020: BUN 12; Creatinine, Ser 1.02; Hemoglobin 12.6; Platelets 262; Potassium 4.6; Sodium 141; TSH 1.680    Lipid Panel    Component Value Date/Time   CHOL 194 02/01/2020 0939   TRIG 40 02/01/2020 0939   HDL 72 02/01/2020 0939   CHOLHDL 2.7 02/01/2020 0939   CHOLHDL 2.2 06/27/2015 1637   VLDL 8 06/27/2015 1637   LDLCALC 114 (H) 02/01/2020 0939       Other studies Reviewed: Additional studies/ records that were reviewed today include:  Echo 09/15/16 Study Conclusions  - Left ventricle: The cavity size was normal. Systolic function was normal. The estimated ejection fraction was in the range of 55% to 60%. Wall motion was normal; there were no regional wall motion abnormalities. Left ventricular diastolic function parameters were normal. - Aortic valve: Transvalvular velocity was within the normal range. There was no stenosis. There was no regurgitation. - Mitral valve: Transvalvular velocity was within the normal range. There was no evidence for stenosis. There  was trivial regurgitation. - Left atrium: The atrium was mildly dilated. - Right ventricle: The cavity size was normal. Wall thickness was normal. Systolic function was normal. - Tricuspid valve: There was trivial regurgitation. - Pulmonary arteries: Systolic pressure was within the normal range. PA peak pressure: 26 mm Hg (S).   ETT Study Highlights    Blood pressure demonstrated a normal response to exercise.  Pt wallked for 7:48 on A Bruce GXT . Peak HR if 169 which is 91 % predicted HR. There were no ST or T wave changes to suggest ischemia.  BP response was  normal .  Negative GXT    Out pt monitor  Monitor shows SR with isolated PACs Symptoms of palpitations did not always correlate with skeps Could try low dose toprol 25 mg per day to see if helps         ASSESSMENT AND PLAN:  1.  HTN   I concerned BP is still a little high    I would recomm increasing labetalol o 200 am and 100 pm   Follow BP at home    2.   Plalpations Pt still has some   Says they were better on diltiazem    No dizziness   Tolerates  3  HCM  Discussed diet  (sugar intake, time restricted eating)    Will set up for fasting lipids, BMET, CBC and TSH        Current medicines are reviewed with the patient today.  The patient Has no concerns regarding medicines.  The following changes have been made:  See above Labs/ tests ordered today include:see above  Disposition:   FU:  see above  Signed, Dietrich Pates, MD  02/01/2020 9:57 PM    [facility] [address] [place], [place], [place]  [address]/ [address] [place], [place] Phone: [phone]; Fax: [phone]  [phone]

## 2020-02-01 ENCOUNTER — Ambulatory Visit: Payer: Medicaid Other | Admitting: Internal Medicine

## 2020-02-01 ENCOUNTER — Other Ambulatory Visit: Payer: Self-pay

## 2020-02-01 ENCOUNTER — Encounter: Payer: Self-pay | Admitting: Internal Medicine

## 2020-02-01 VITALS — BP 128/88 | HR 77 | Ht 64.0 in | Wt 162.4 lb

## 2020-02-01 DIAGNOSIS — I10 Essential (primary) hypertension: Secondary | ICD-10-CM | POA: Diagnosis not present

## 2020-02-01 LAB — LIPID PANEL
Chol/HDL Ratio: 2.7 ratio (ref 0.0–4.4)
Cholesterol, Total: 194 mg/dL (ref 100–199)
HDL: 72 mg/dL (ref >39)
LDL Chol Calc (NIH): 114 mg/dL — ABNORMAL HIGH (ref 0–99)
Triglycerides: 40 mg/dL (ref 0–149)
VLDL Cholesterol Cal: 8 mg/dL (ref 5–40)

## 2020-02-01 LAB — BASIC METABOLIC PANEL
BUN/Creatinine Ratio: 12 (ref 9–23)
BUN: 12 mg/dL (ref 6–20)
CO2: 25 mmol/L (ref 20–29)
Calcium: 9.7 mg/dL (ref 8.7–10.2)
Chloride: 104 mmol/L (ref 96–106)
Creatinine, Ser: 1.02 mg/dL — ABNORMAL HIGH (ref 0.57–1.00)
GFR calc Af Amer: 81 mL/min/{1.73_m2} (ref >59)
GFR calc non Af Amer: 70 mL/min/{1.73_m2} (ref >59)
Glucose: 95 mg/dL (ref 65–99)
Potassium: 4.6 mmol/L (ref 3.5–5.2)
Sodium: 141 mmol/L (ref 134–144)

## 2020-02-01 LAB — TSH: TSH: 1.68 u[IU]/mL (ref 0.450–4.500)

## 2020-02-01 LAB — CBC
Hematocrit: 38.6 % (ref 34.0–46.6)
Hemoglobin: 12.6 g/dL (ref 11.1–15.9)
MCH: 28.7 pg (ref 26.6–33.0)
MCHC: 32.6 g/dL (ref 31.5–35.7)
MCV: 88 fL (ref 79–97)
Platelets: 262 10*3/uL (ref 150–450)
RBC: 4.39 x10E6/uL (ref 3.77–5.28)
RDW: 12.9 % (ref 11.7–15.4)
WBC: 3.6 10*3/uL (ref 3.4–10.8)

## 2020-02-01 NOTE — Patient Instructions (Signed)
Medication Instructions:  No changes *If you need a refill on your cardiac medications before your next appointment, please call your pharmacy*   Lab Work: Today: CBC, BMET, TSH, LIPIDS  If you have labs (blood work) drawn today and your tests are completely normal, you will receive your results only by: Marland Kitchen MyChart Message (if you have MyChart) OR . A paper copy in the mail If you have any lab test that is abnormal or we need to change your treatment, we will call you to review the results.   Testing/Procedures: NONE   Follow-Up: At [facility], you and your health needs are our priority.  As part of our continuing mission to provide you with exceptional heart care, we have created designated Provider Care Teams.  These Care Teams include your primary Cardiologist (physician) and Advanced Practice Providers (APPs -  Physician Assistants and Nurse Practitioners) who all work together to provide you with the care you need, when you need it.   Your next appointment:   12 month(s)  The format for your next appointment:   In Person  Provider:   You may see Dietrich Pates, MD or one of the following Advanced Practice Providers on your designated Care Team:    [name], PA-C  Chelsea Aus, [place]    Other Instructions

## 2020-02-03 ENCOUNTER — Other Ambulatory Visit: Payer: Self-pay

## 2020-02-05 MED ORDER — LABETALOL HCL 100 MG PO TABS: 100.0000 mg | ORAL_TABLET | Freq: Two times a day (BID) | ORAL | 3 refills | Status: DC

## 2020-02-06 ENCOUNTER — Telehealth: Payer: Self-pay | Admitting: *Deleted

## 2020-02-06 DIAGNOSIS — Z79899 Other long term (current) drug therapy: Secondary | ICD-10-CM

## 2020-02-06 NOTE — Telephone Encounter (Signed)
-----   Message from Dietrich Pates V, MD sent at 02/05/2020 11:43 PM EST ----- CBC is normal Thyroid function is normal Electrolyte and kidney functoin are normal Lpids could be better   Were not fasting Have pt come back for fasting

## 2020-02-07 ENCOUNTER — Other Ambulatory Visit: Payer: Medicaid Other | Admitting: *Deleted

## 2020-02-07 ENCOUNTER — Other Ambulatory Visit: Payer: Self-pay

## 2020-02-07 DIAGNOSIS — Z79899 Other long term (current) drug therapy: Secondary | ICD-10-CM

## 2020-02-08 LAB — LIPID PANEL
Chol/HDL Ratio: 2.9 ratio (ref 0.0–4.4)
Cholesterol, Total: 184 mg/dL (ref 100–199)
HDL: 63 mg/dL (ref >39)
LDL Chol Calc (NIH): 106 mg/dL — ABNORMAL HIGH (ref 0–99)
Triglycerides: 81 mg/dL (ref 0–149)
VLDL Cholesterol Cal: 15 mg/dL (ref 5–40)

## 2020-02-08 LAB — HEPATIC FUNCTION PANEL
ALT: 13 IU/L (ref 0–32)
AST: 16 IU/L (ref 0–40)
Albumin: 4.6 g/dL (ref 3.8–4.8)
Alkaline Phosphatase: 47 IU/L (ref 44–121)
Bilirubin Total: 0.3 mg/dL (ref 0.0–1.2)
Bilirubin, Direct: 0.11 mg/dL (ref 0.00–0.40)
Total Protein: 7.3 g/dL (ref 6.0–8.5)

## 2020-02-09 ENCOUNTER — Other Ambulatory Visit: Payer: Self-pay

## 2020-02-09 MED ORDER — LABETALOL HCL 100 MG PO TABS: 100.0000 mg | ORAL_TABLET | Freq: Two times a day (BID) | ORAL | 3 refills | Status: DC

## 2020-02-14 ENCOUNTER — Ambulatory Visit: Payer: Medicaid Other | Admitting: Student in an Organized Health Care Education/Training Program

## 2020-02-28 ENCOUNTER — Ambulatory Visit (INDEPENDENT_AMBULATORY_CARE_PROVIDER_SITE_OTHER): Payer: Medicaid Other | Admitting: Student in an Organized Health Care Education/Training Program

## 2020-02-28 ENCOUNTER — Encounter: Payer: Self-pay | Admitting: Student in an Organized Health Care Education/Training Program

## 2020-02-28 ENCOUNTER — Other Ambulatory Visit: Payer: Self-pay

## 2020-02-28 DIAGNOSIS — F900 Attention-deficit hyperactivity disorder, predominantly inattentive type: Secondary | ICD-10-CM | POA: Diagnosis present

## 2020-02-28 NOTE — Patient Instructions (Signed)
It was a pleasure to see you today!  To summarize our discussion for this visit:  Please make a follow up appointment at your convenience to further discuss refills of your controlled substances. Or, if you would prefer as discussed in our appointment, you are welcome to seek care with a psychiatrist or your former MD.   Some additional health maintenance measures we should update are: Health Maintenance Due  Topic Date Due  . Hepatitis C Screening  Never done  . INFLUENZA VACCINE  08/13/2019  .    Call the clinic at [phone] if your symptoms worsen or you have any concerns.   Thank you for allowing me to take part in your care,  Dr. Jamelle Rushing

## 2020-02-28 NOTE — Progress Notes (Signed)
   SUBJECTIVE:   CHIEF COMPLAINT / HPI: requesting refill of vyvanse and xanax  H/o Adult ADHD- diagnosed in college. Took 30mg  vyvanse for work days previously. Was initially prescribed by focus MD in [place] but then switched to our clinic when her insurance coverage changed. Had gotten refills here by prior providers but I have not seen her for it before.  H/o xanax use for sleep aid. Last took around [place] and takes 0.25mg  each time. Requesting refill today. Not interested in discussing sleep hygeine or other sleep aids. She has just started seeing a therapist. Denies any anxiety or depression.  OBJECTIVE:   BP 110/70   Pulse 90   Ht 5\' 3"  (1.6 m)   Wt 160 lb 3.2 oz (72.7 kg)   SpO2 100%   BMI 28.38 kg/m   General: NAD Neuro: alert Psych: Normal affect and mood  ASSESSMENT/PLAN:   ADHD (attention deficit hyperactivity disorder) Started on vyvanse 2018 by previous provider. Patient requests refill to help her going back to work on Monday. Requested patient return for follow up visit to initiate refills and patient became agitated. She endorsed she would either return for a repeat visit with another provider at our practice or seek care elsewhere for refills.   H/o xanax use for sleep aid- wanting refills since she is going back to work and is on a night schedule and wanting to use for helping her fall asleep. discussed sleep hygiene practices with patient briefly and alternative sleep aid options but patient was not interested in alternatives. She stated that she was fine with not getting a refill rather than discuss alternatives. She does take melatonin, or used to.  2019, DO [facility]

## 2020-03-01 NOTE — Assessment & Plan Note (Signed)
Started on vyvanse 2018 by previous provider. Patient requests refill to help her going back to work on Monday. Requested patient return for follow up visit to initiate refills and patient became agitated. She endorsed she would either return for a repeat visit with another provider at our practice or seek care elsewhere for refills.

## 2020-12-09 ENCOUNTER — Ambulatory Visit: Payer: Medicaid Other | Admitting: Family Medicine

## 2021-02-03 ENCOUNTER — Other Ambulatory Visit: Payer: Self-pay

## 2021-02-03 ENCOUNTER — Encounter: Payer: Self-pay | Admitting: Student

## 2021-02-03 ENCOUNTER — Ambulatory Visit (INDEPENDENT_AMBULATORY_CARE_PROVIDER_SITE_OTHER): Payer: Medicaid Other | Admitting: Student

## 2021-02-03 VITALS — BP 110/76 | HR 87 | Ht 63.0 in | Wt 150.2 lb

## 2021-02-03 DIAGNOSIS — Z9882 Breast implant status: Secondary | ICD-10-CM | POA: Diagnosis not present

## 2021-02-03 DIAGNOSIS — I10 Essential (primary) hypertension: Secondary | ICD-10-CM | POA: Diagnosis not present

## 2021-02-03 DIAGNOSIS — Z1231 Encounter for screening mammogram for malignant neoplasm of breast: Secondary | ICD-10-CM

## 2021-02-03 NOTE — Assessment & Plan Note (Addendum)
Patient will need screening mammography starting at age 41. Due to implants, considered breast CT vs mammogram, but per last breast imaging note, it appears mammogram with implant protocol would be appropriate.  - Referral placed to breast center for mammogram with implant protocol, if specialist feels CT is necessary, will order at that time

## 2021-02-03 NOTE — Progress Notes (Signed)
° ° °  SUBJECTIVE:   CHIEF COMPLAINT / HPI:   Subpectoral Breast Implants: Patient presents today for follow-up. She has bilateral subpectoral breast implants and has questions regarding the appropriate imaging modality for breast CA screening starting at age 40. She previously had a diagnostic mammogram and ultrasound in 2017 for a lump that turned out to be a benign intramammary lymph node. She does not currently have any masses or concerns. She was told by a previous provider that she would need a breast CT given her implants.   Hypertension: Follows with cardiology as she has a hx of PACs. Recently decreased dose of labetalol to 100mg  BID. No issues at this time, seems to be tolerating lower dose well.   PERTINENT  PMH / PSH: Hx abnormal pap (ASCUS HPV+ 2010), anxiety, HTN  OBJECTIVE:   BP 110/76    Pulse 87    Ht 5\' 3"  (1.6 m)    Wt 150 lb 3.2 oz (68.1 kg)    SpO2 98%    BMI 26.61 kg/m   Physical Exam Vitals reviewed.  Constitutional:      General: She is not in acute distress.    Appearance: She is normal weight.  Cardiovascular:     Rate and Rhythm: Normal rate and regular rhythm.  Pulmonary:     Effort: Pulmonary effort is normal.  Abdominal:     General: Abdomen is flat.     Palpations: Abdomen is soft.  Musculoskeletal:        General: No deformity.  Neurological:     General: No focal deficit present.  Psychiatric:        Mood and Affect: Mood normal.     ASSESSMENT/PLAN:   S/P breast implant, subpectoral Patient will need screening mammography starting at age 40. Due to implants, considered breast CT vs mammogram, but per last breast imaging note, it appears mammogram with implant protocol would be appropriate.  - Referral placed to breast center for mammogram with implant protocol, if specialist feels CT is necessary, will order at that time  Hypertension Well-controlled today, continue labetalol 100mg  BID    , MD [facility]

## 2021-02-03 NOTE — Patient Instructions (Addendum)
Ms. Pivonka, It is such a joy to take care you! Thank you for coming in today.   - As you will soon be 41, you are due for a screening mammogram, given your implants, they will use a special imaging protocol to make sure that they get sufficient views of all of your breast tissue. - Your blood pressure looks great today!  Take care and seek immediate care sooner if you develop any concerns.   Marnee Guarneri, MD [place]

## 2021-02-03 NOTE — Assessment & Plan Note (Signed)
Well-controlled today, continue labetalol 100mg  BID

## 2021-03-17 ENCOUNTER — Ambulatory Visit: Payer: Medicaid Other

## 2021-04-11 ENCOUNTER — Other Ambulatory Visit: Payer: Self-pay | Admitting: *Deleted

## 2021-04-11 MED ORDER — LABETALOL HCL 100 MG PO TABS: 100.0000 mg | ORAL_TABLET | Freq: Two times a day (BID) | ORAL | 0 refills | Status: DC

## 2021-04-15 MED ORDER — LABETALOL HCL 100 MG PO TABS: 100.0000 mg | ORAL_TABLET | Freq: Two times a day (BID) | ORAL | 0 refills | Status: DC

## 2021-04-15 NOTE — Addendum Note (Signed)
Addended by: Margaret Pyle D on: 04/15/2021 03:29 PM ? ? Modules accepted: Orders ? ?

## 2021-04-22 ENCOUNTER — Ambulatory Visit: Payer: Medicaid Other

## 2021-05-26 ENCOUNTER — Ambulatory Visit: Payer: Medicaid Other

## 2021-05-27 ENCOUNTER — Ambulatory Visit: Payer: Medicaid Other

## 2021-06-11 ENCOUNTER — Ambulatory Visit
Admission: RE | Admit: 2021-06-11 | Discharge: 2021-06-11 | Disposition: A | Discharge status: Home / Self Care | Payer: Medicaid Other | Source: Ambulatory Visit | Attending: Family Medicine | Admitting: Family Medicine

## 2021-06-11 DIAGNOSIS — Z1231 Encounter for screening mammogram for malignant neoplasm of breast: Secondary | ICD-10-CM

## 2021-06-17 ENCOUNTER — Encounter: Payer: Self-pay | Admitting: *Deleted

## 2021-07-07 ENCOUNTER — Ambulatory Visit (INDEPENDENT_AMBULATORY_CARE_PROVIDER_SITE_OTHER): Payer: Medicaid Other | Admitting: Internal Medicine

## 2021-07-07 ENCOUNTER — Other Ambulatory Visit: Payer: Self-pay | Admitting: *Deleted

## 2021-07-07 VITALS — BP 120/75 | HR 75 | Ht 63.0 in | Wt 148.4 lb

## 2021-07-07 DIAGNOSIS — E7800 Pure hypercholesterolemia, unspecified: Secondary | ICD-10-CM

## 2021-07-07 MED ORDER — LABETALOL HCL 100 MG PO TABS: 100.0000 mg | ORAL_TABLET | Freq: Two times a day (BID) | ORAL | 3 refills | Status: AC

## 2021-07-15 ENCOUNTER — Encounter: Payer: Self-pay | Admitting: Internal Medicine

## 2021-07-15 LAB — NMR, LIPOPROFILE
Cholesterol, Total: 201 mg/dL — ABNORMAL HIGH (ref 100–199)
HDL Particle Number: 35.9 umol/L (ref >=30.5)
HDL-C: 83 mg/dL (ref >39)
LDL Particle Number: 954 nmol/L (ref <1000)
LDL Size: 21.7 nm (ref >20.5)
LDL-C (NIH Calc): 109 mg/dL — ABNORMAL HIGH (ref 0–99)
LP-IR Score: <25 (ref <=45)
Small LDL Particle Number: <90 nmol/L (ref <=527)
Triglycerides: 46 mg/dL (ref 0–149)

## 2021-07-15 LAB — LIPOPROTEIN A (LPA): Lipoprotein (a): 44.1 nmol/L (ref <75.0)

## 2021-07-15 LAB — APOLIPOPROTEIN B: Apolipoprotein B: 76 mg/dL (ref <90)

## 2022-11-18 IMAGING — MG DIGITAL SCREENING BREAST BILAT IMPLANT W/ TOMO W/ CAD
9 of 12 series · 9 of 28 positions shown · non-contrast
Comparison: 07/11/2015

CLINICAL DATA: Screening.

EXAM:
DIGITAL SCREENING BILATERAL MAMMOGRAM WITH IMPLANTS, CAD AND
TOMOSYNTHESIS
TECHNIQUE: Bilateral screening digital craniocaudal and mediolateral oblique
mammograms were obtained. Bilateral screening digital breast
tomosynthesis was performed. The images were evaluated with
computer-aided detection. Standard and/or implant displaced views
were performed.

[R CC]
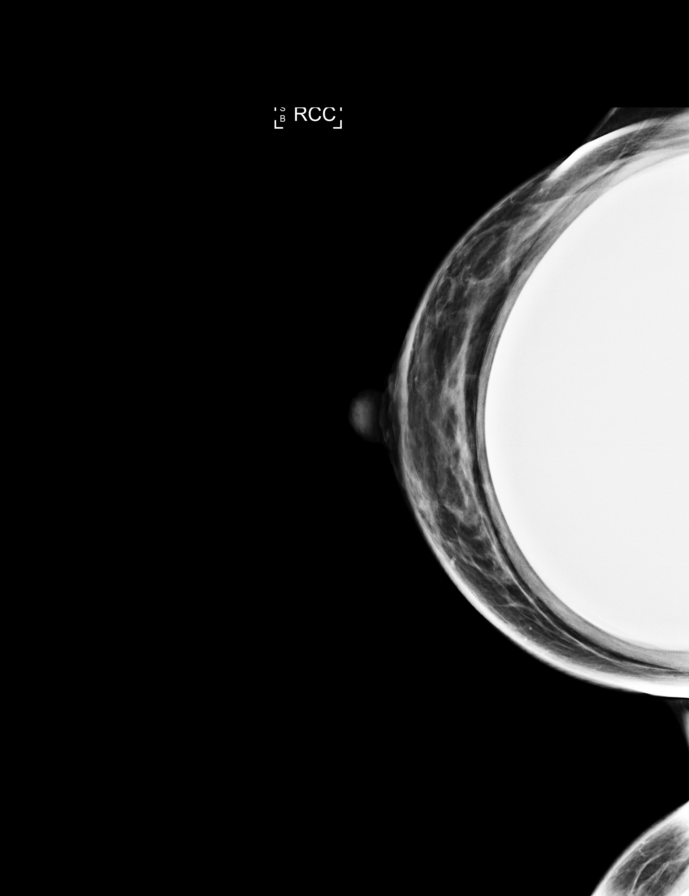

[L CC]
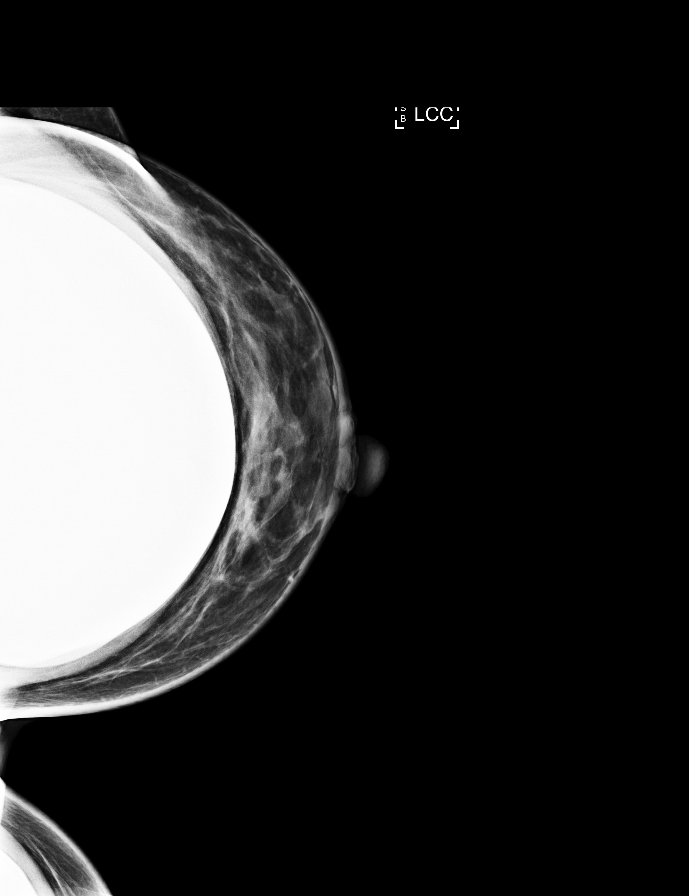

[L MLO]
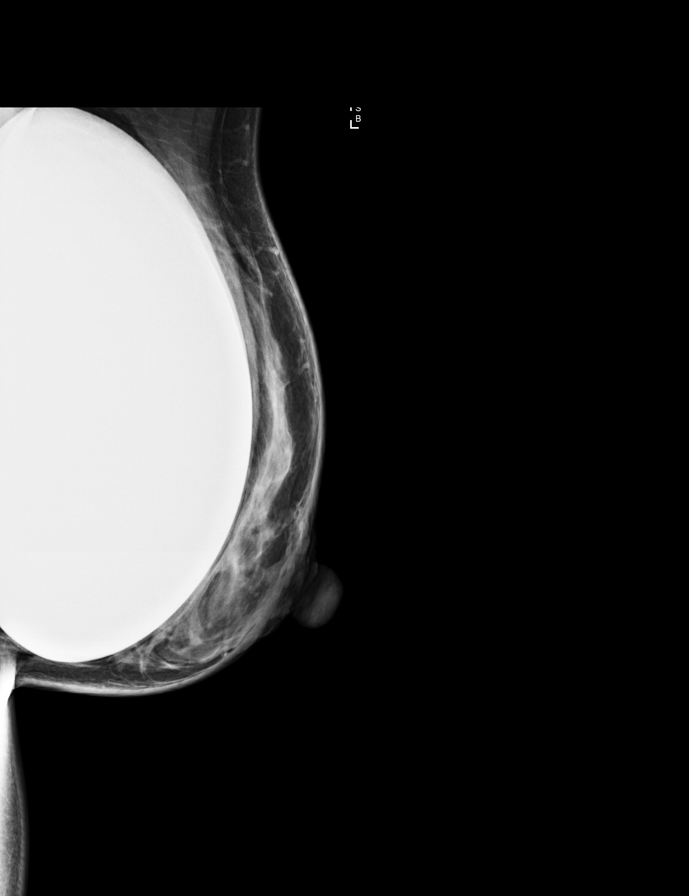

[R MLO]
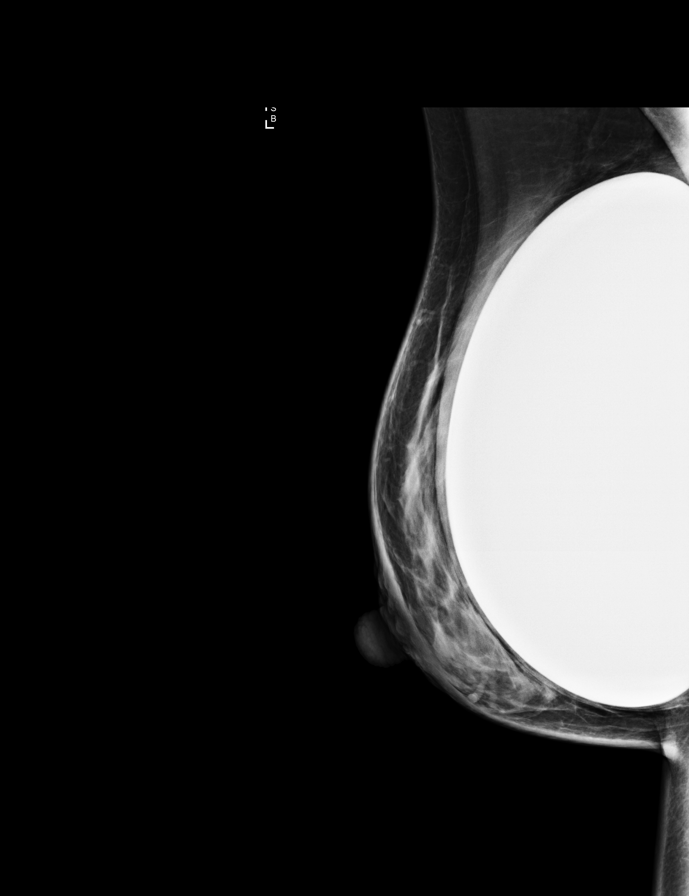

[L CC synth-2D]
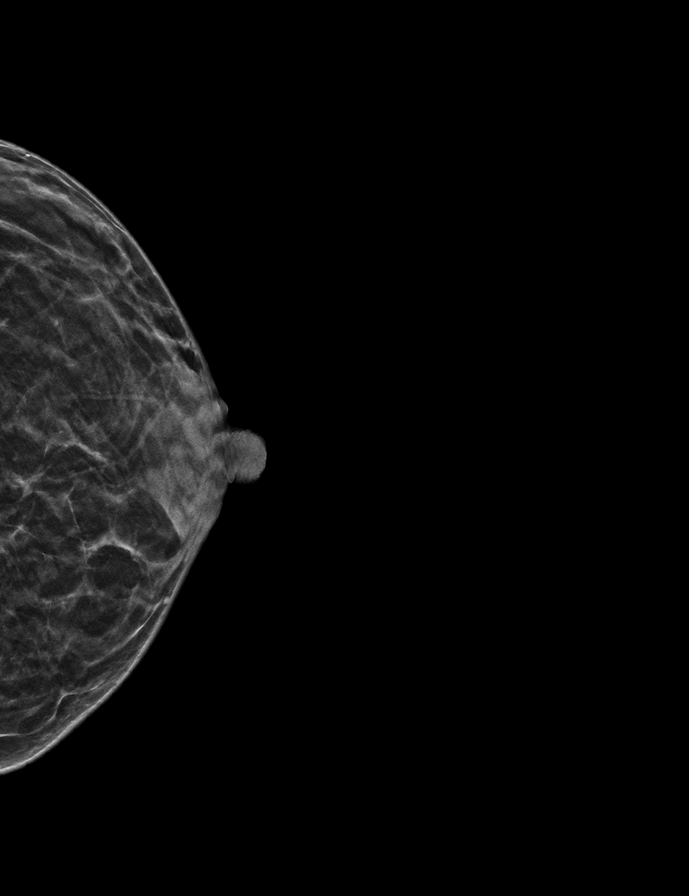

[L MLO synth-2D]
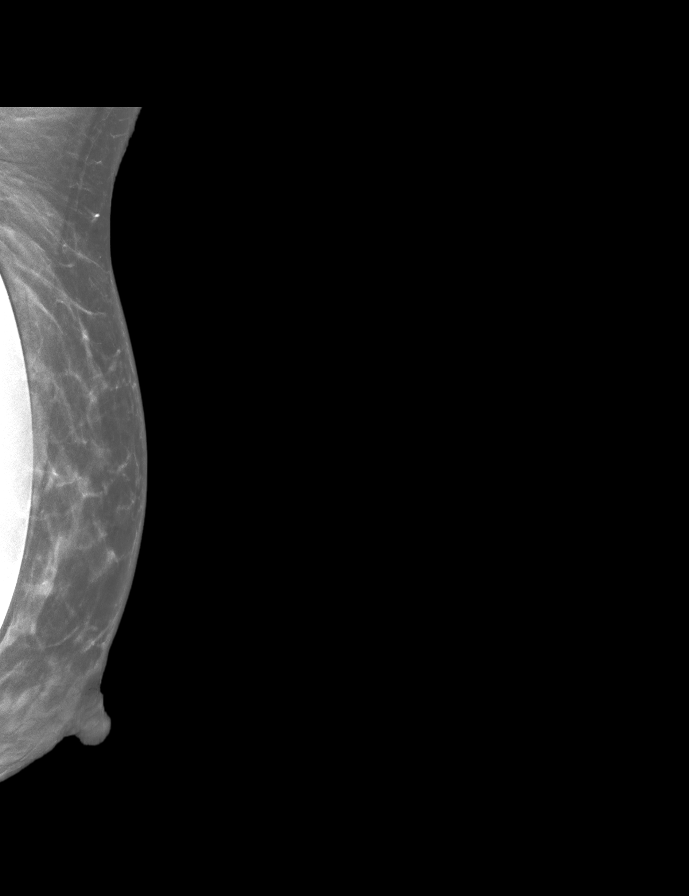

[R CC synth-2D]
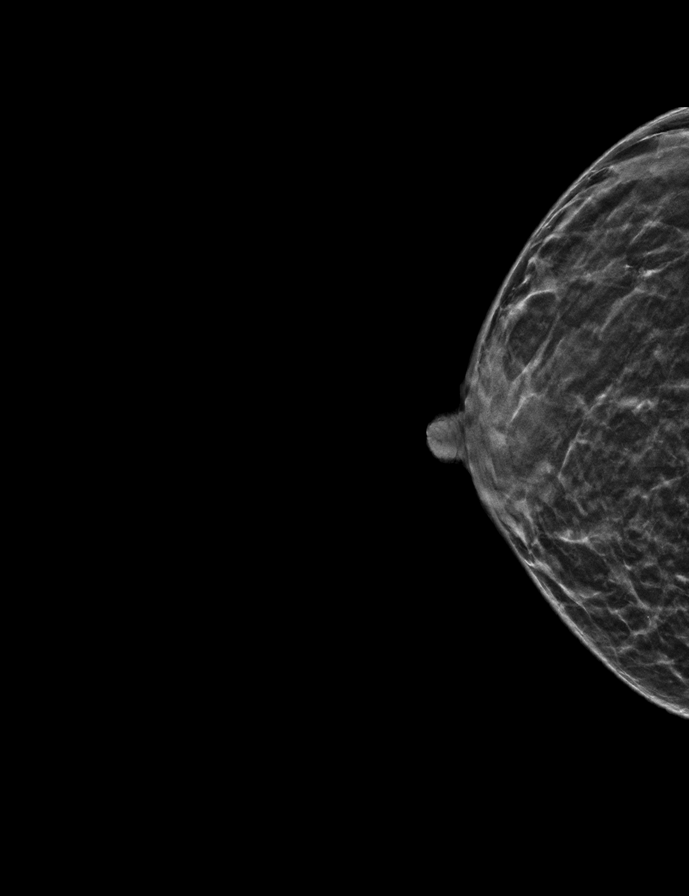

[R MLO synth-2D]
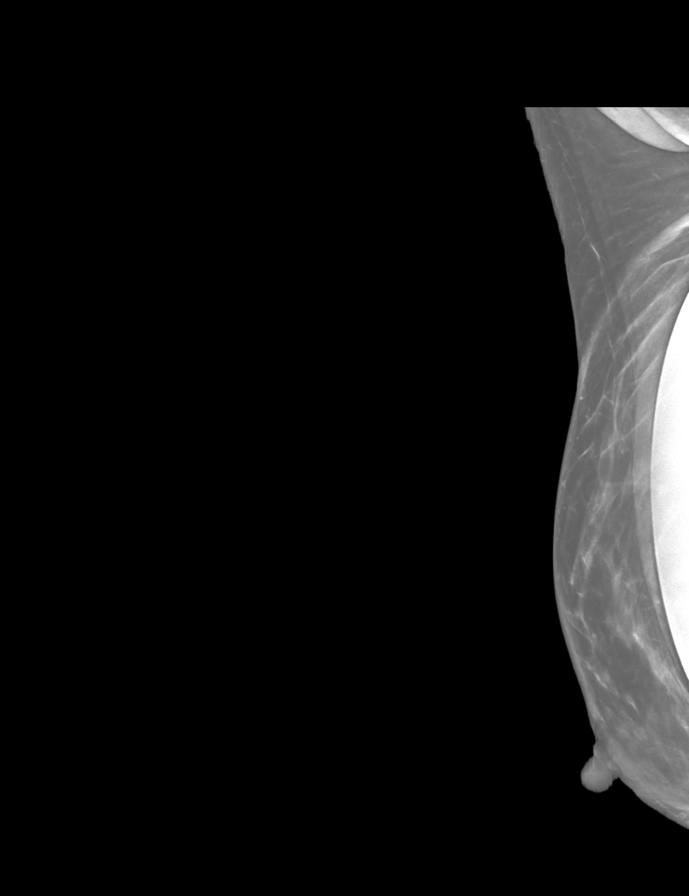

[R MLOID BREAST TOMOSYNTHESIS IMAGE tomo · tomo slice 25/50.0]
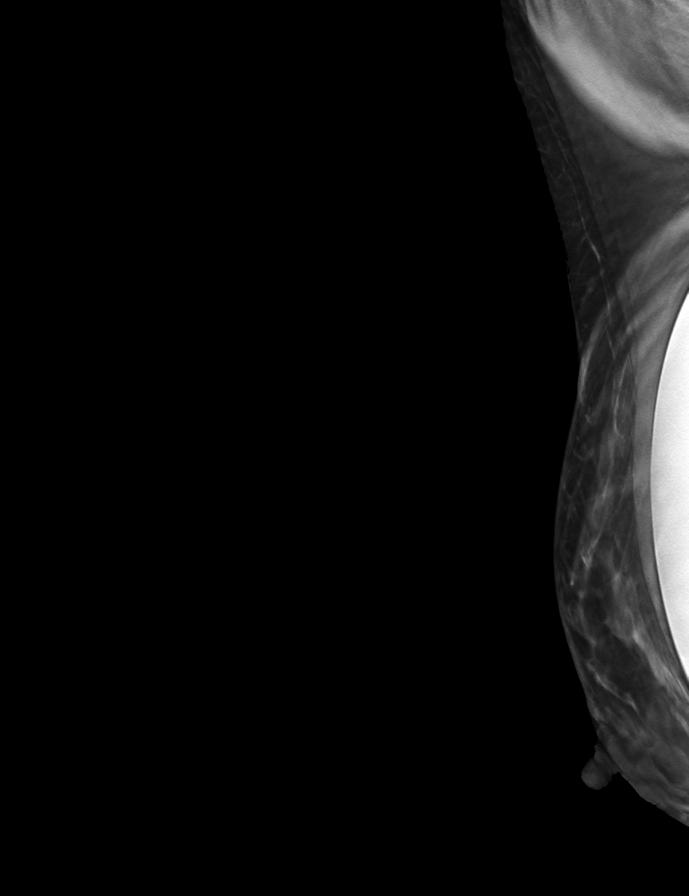

[9 of 28 positions shown; findings below may reference images not displayed]

ACR Breast Density Category c: The breast tissue is heterogeneously
dense, which may obscure small masses.
FINDINGS: The patient has retropectoral implants. There are no findings
suspicious for malignancy.
IMPRESSION: No mammographic evidence of malignancy. A result letter of this
screening mammogram will be mailed directly to the patient.

RECOMMENDATION:
Screening mammogram in one year. (Code:MJ-7-UM6)

BI-RADS CATEGORY  1:  Negative.

## 2023-06-22 ENCOUNTER — Encounter: Payer: Self-pay | Admitting: *Deleted
